# Patient Record
Sex: Male | Born: 1955 | Race: White | Hispanic: No | Marital: Single | Smoking: Never smoker
Health system: Southern US, Community
[De-identification: ages and names within clinical notes are randomized; demographics above are authoritative.]

## PROBLEM LIST (undated history)

## (undated) DIAGNOSIS — M1990 Unspecified osteoarthritis, unspecified site: Secondary | ICD-10-CM

## (undated) DIAGNOSIS — E039 Hypothyroidism, unspecified: Secondary | ICD-10-CM

## (undated) DIAGNOSIS — R296 Repeated falls: Secondary | ICD-10-CM

## (undated) DIAGNOSIS — I10 Essential (primary) hypertension: Secondary | ICD-10-CM

## (undated) DIAGNOSIS — M810 Age-related osteoporosis without current pathological fracture: Secondary | ICD-10-CM

## (undated) DIAGNOSIS — F79 Unspecified intellectual disabilities: Secondary | ICD-10-CM

## (undated) DIAGNOSIS — H52209 Unspecified astigmatism, unspecified eye: Secondary | ICD-10-CM

## (undated) DIAGNOSIS — H5210 Myopia, unspecified eye: Secondary | ICD-10-CM

## (undated) DIAGNOSIS — D649 Anemia, unspecified: Secondary | ICD-10-CM

## (undated) DIAGNOSIS — G40909 Epilepsy, unspecified, not intractable, without status epilepticus: Secondary | ICD-10-CM

## (undated) DIAGNOSIS — Q909 Down syndrome, unspecified: Secondary | ICD-10-CM

---

## 1997-10-08 ENCOUNTER — Other Ambulatory Visit: Admission: RE | Admit: 1997-10-08 | Discharge: 1997-10-08 | Payer: Self-pay | Admitting: Internal Medicine

## 2002-01-23 ENCOUNTER — Encounter: Payer: Self-pay | Admitting: Specialist

## 2002-01-23 ENCOUNTER — Ambulatory Visit (HOSPITAL_COMMUNITY): Admission: RE | Admit: 2002-01-23 | Discharge: 2002-01-23 | Payer: Self-pay | Admitting: Specialist

## 2002-01-24 ENCOUNTER — Encounter: Payer: Self-pay | Admitting: Specialist

## 2002-01-24 ENCOUNTER — Ambulatory Visit (HOSPITAL_COMMUNITY): Admission: RE | Admit: 2002-01-24 | Discharge: 2002-01-24 | Payer: Self-pay | Admitting: Specialist

## 2002-04-15 ENCOUNTER — Encounter: Payer: Self-pay | Admitting: Specialist

## 2002-04-15 ENCOUNTER — Ambulatory Visit (HOSPITAL_COMMUNITY): Admission: RE | Admit: 2002-04-15 | Discharge: 2002-04-15 | Payer: Self-pay | Admitting: Specialist

## 2002-04-16 ENCOUNTER — Emergency Department (HOSPITAL_COMMUNITY): Admission: EM | Admit: 2002-04-16 | Discharge: 2002-04-16 | Payer: Self-pay | Admitting: Emergency Medicine

## 2002-04-28 ENCOUNTER — Ambulatory Visit (HOSPITAL_COMMUNITY): Admission: RE | Admit: 2002-04-28 | Discharge: 2002-04-28 | Payer: Self-pay | Admitting: Specialist

## 2002-04-28 ENCOUNTER — Encounter: Payer: Self-pay | Admitting: Specialist

## 2002-07-14 ENCOUNTER — Ambulatory Visit (HOSPITAL_COMMUNITY): Admission: RE | Admit: 2002-07-14 | Discharge: 2002-07-14 | Payer: Self-pay | Admitting: Specialist

## 2002-07-14 ENCOUNTER — Encounter: Payer: Self-pay | Admitting: Specialist

## 2002-08-25 ENCOUNTER — Encounter: Payer: Self-pay | Admitting: Specialist

## 2002-08-25 ENCOUNTER — Ambulatory Visit (HOSPITAL_COMMUNITY): Admission: RE | Admit: 2002-08-25 | Discharge: 2002-08-25 | Payer: Self-pay | Admitting: Specialist

## 2002-10-02 ENCOUNTER — Ambulatory Visit (HOSPITAL_COMMUNITY): Admission: RE | Admit: 2002-10-02 | Discharge: 2002-10-02 | Payer: Self-pay | Admitting: Specialist

## 2002-10-02 ENCOUNTER — Encounter: Payer: Self-pay | Admitting: Specialist

## 2003-07-27 ENCOUNTER — Ambulatory Visit (HOSPITAL_COMMUNITY): Admission: RE | Admit: 2003-07-27 | Discharge: 2003-07-27 | Payer: Self-pay | Admitting: Specialist

## 2003-08-07 ENCOUNTER — Ambulatory Visit (HOSPITAL_COMMUNITY): Admission: RE | Admit: 2003-08-07 | Discharge: 2003-08-07 | Payer: Self-pay | Admitting: Specialist

## 2003-08-20 ENCOUNTER — Ambulatory Visit (HOSPITAL_COMMUNITY): Admission: RE | Admit: 2003-08-20 | Discharge: 2003-08-20 | Payer: Self-pay | Admitting: Specialist

## 2003-10-08 ENCOUNTER — Ambulatory Visit (HOSPITAL_COMMUNITY): Admission: RE | Admit: 2003-10-08 | Discharge: 2003-10-08 | Payer: Self-pay | Admitting: Specialist

## 2004-03-22 ENCOUNTER — Ambulatory Visit: Payer: Self-pay | Admitting: Internal Medicine

## 2004-03-23 ENCOUNTER — Ambulatory Visit (HOSPITAL_COMMUNITY): Admission: RE | Admit: 2004-03-23 | Discharge: 2004-03-23 | Payer: Self-pay | Admitting: Specialist

## 2004-06-23 ENCOUNTER — Emergency Department (HOSPITAL_COMMUNITY): Admission: EM | Admit: 2004-06-23 | Discharge: 2004-06-23 | Payer: Self-pay | Admitting: Emergency Medicine

## 2011-04-09 ENCOUNTER — Emergency Department (HOSPITAL_BASED_OUTPATIENT_CLINIC_OR_DEPARTMENT_OTHER)
Admission: EM | Admit: 2011-04-09 | Discharge: 2011-04-10 | Disposition: A | Discharge status: Home / Self Care | Payer: Medicare Other | Attending: Emergency Medicine | Admitting: Emergency Medicine

## 2011-04-09 ENCOUNTER — Emergency Department (INDEPENDENT_AMBULATORY_CARE_PROVIDER_SITE_OTHER): Payer: Medicare Other

## 2011-04-09 ENCOUNTER — Emergency Department (HOSPITAL_BASED_OUTPATIENT_CLINIC_OR_DEPARTMENT_OTHER): Payer: Medicare Other

## 2011-04-09 DIAGNOSIS — R05 Cough: Secondary | ICD-10-CM

## 2011-04-09 DIAGNOSIS — R509 Fever, unspecified: Secondary | ICD-10-CM | POA: Insufficient documentation

## 2011-04-09 DIAGNOSIS — R059 Cough, unspecified: Secondary | ICD-10-CM | POA: Insufficient documentation

## 2011-04-09 DIAGNOSIS — J189 Pneumonia, unspecified organism: Secondary | ICD-10-CM | POA: Insufficient documentation

## 2011-04-09 HISTORY — DX: Unspecified intellectual disabilities: F79

## 2011-04-09 LAB — CBC
MCH: 36.7 pg — ABNORMAL HIGH (ref 26.0–34.0)
MCHC: 33.1 g/dL (ref 30.0–36.0)
Platelets: 187 10*3/uL (ref 150–400)

## 2011-04-09 LAB — COMPREHENSIVE METABOLIC PANEL
ALT: 15 U/L (ref 0–53)
AST: 23 U/L (ref 0–37)
Calcium: 8.7 mg/dL (ref 8.4–10.5)
Creatinine, Ser: 0.9 mg/dL (ref 0.50–1.35)
GFR calc Af Amer: >90 mL/min (ref >90)
Glucose, Bld: 91 mg/dL (ref 70–99)
Sodium: 139 mEq/L (ref 135–145)
Total Protein: 8.2 g/dL (ref 6.0–8.3)

## 2011-04-09 LAB — DIFFERENTIAL
Eosinophils Absolute: 0 10*3/uL (ref 0.0–0.7)
Monocytes Absolute: 0.6 10*3/uL (ref 0.1–1.0)
Neutrophils Relative %: 84 % — ABNORMAL HIGH (ref 43–77)

## 2011-04-09 LAB — LACTIC ACID, PLASMA: Lactic Acid, Venous: 1.8 mmol/L (ref 0.5–2.2)

## 2011-04-09 LAB — AMMONIA: Ammonia: 15 umol/L (ref 11–60)

## 2011-04-09 MED ORDER — OSELTAMIVIR PHOSPHATE 75 MG PO CAPS
75.0000 mg | ORAL_CAPSULE | Freq: Once | ORAL | Status: AC
  Administered 2011-04-09: 75 mg via ORAL

## 2011-04-09 MED ORDER — OSELTAMIVIR PHOSPHATE 75 MG PO CAPS
ORAL_CAPSULE | ORAL | Status: AC
  Administered 2011-04-09: 75 mg via ORAL
  Filled 2011-04-09: qty 1

## 2011-04-09 MED ORDER — SODIUM CHLORIDE 0.9 % IV BOLUS (SEPSIS)
1000.0000 mL | Freq: Once | INTRAVENOUS | Status: AC
  Administered 2011-04-09: 1000 mL via INTRAVENOUS

## 2011-04-09 MED ORDER — OSELTAMIVIR PHOSPHATE 6 MG/ML PO SUSR
75.0000 mg | Freq: Once | ORAL | Status: DC
  Filled 2011-04-09: qty 12.5

## 2011-04-09 MED ORDER — MOXIFLOXACIN HCL IN NACL 400 MG/250ML IV SOLN
400.0000 mg | Freq: Once | INTRAVENOUS | Status: AC
  Administered 2011-04-09: 400 mg via INTRAVENOUS
  Filled 2011-04-09: qty 250

## 2011-04-09 NOTE — ED Notes (Signed)
Caregiver states that pt has been running a fever and has persistent cough, was given tylenol at group home

## 2011-04-09 NOTE — ED Provider Notes (Signed)
History  This chart was scribed for Cyndra Numbers, MD by Bennett Scrape. This patient was seen in room [facility]CT1/[facility]1 and the patient's care was started at 10:34PM.  CSN: [id number] Arrival date & time: 04/09/2011  9:05 PM   First MD Initiated Contact with Patient 04/09/11 2217      Chief Complaint  Patient presents with  . Cough  . Fever    102.5   Level 5 Caveat-Pt is non-verbal The history is provided by a caregiver. No language interpreter was used.    Travis Hanna is a 55 y.o. male brought in by caregiver from SNF to the Emergency Department complaining of fever to 102 and non-productive cough. Pt is mentally retarded and non-verbal.  Fever measured at 99.1 in the ED. Pt is from group home and is experiencing similar symptoms to other patients in the home. Caregiver states that he was given robitussin and tylenol for symptoms around 5:45PM with no improvement. Caregiver reports that he was diagnosed through a chest x-ray and treated for pneumonia 2 weeks ago with an unknown antibiotic. Caregiver states that pt is not at his normal baseline due to decreased appetite and increased tiredness. Caregiver confirms sick contacts at nursing home and reports two other caregivers being on tamiflu. Symptoms began in the past 48 hours.  Past Medical History  Diagnosis Date  . MR (mental retardation)     History reviewed. No pertinent past surgical history.  History reviewed. No pertinent family history.  History  Substance Use Topics  . Smoking status: Not on file  . Smokeless tobacco: Not on file  . Alcohol Use:       Review of Systems  Unable to perform ROS: Other  Constitutional: Positive for fever.  Respiratory: Positive for cough.   ROS not possible due to non-verbal patient.  Allergies  Carbapenems; Cephalosporins; and Penicillins  Home Medications   Current Outpatient Rx  Name Route Sig Dispense Refill  . ACETAMINOPHEN 325 MG PO TABS Oral Take 650 mg by mouth every  6 (six) hours as needed. For pain and fever     . CALCIUM CARBONATE-VITAMIN D 500-200 MG-UNIT PO TABS Oral Take 1 tablet by mouth daily.      Marland Kitchen CARBAMAZEPINE 100 MG/5ML PO SUSP Oral Take 200 mg by mouth 2 (two) times daily.      . ENALAPRIL MALEATE 5 MG PO TABS Oral Take 5 mg by mouth daily.      . GUAIFENESIN 100 MG/5ML PO LIQD Oral Take 300 mg by mouth 3 (three) times daily as needed. For cough      . LACTULOSE 10 GM/15ML PO SOLN Oral Take 10 mLs by mouth daily.      Marland Kitchen LEVOTHYROXINE SODIUM 50 MCG PO TABS Oral Take 50 mcg by mouth daily.        Triage Vitals: BP 149/72  Pulse 111  Temp(Src) 99.1 F (37.3 C) (Oral)  Resp 20  Wt 150 lb (68.04 kg)  SpO2 95%  Physical Exam  Nursing note and vitals reviewed. Constitutional: He appears well-developed and well-nourished.       Faces of down syndrome  HENT:  Head: Normocephalic and atraumatic.  Eyes: Conjunctivae and EOM are normal. Pupils are equal, round, and reactive to light.       Left eye esotropia  Neck: Neck supple. No tracheal deviation present.  Cardiovascular: Regular rhythm and normal heart sounds.  Tachycardia present.   Pulmonary/Chest: Effort normal and breath sounds normal. No respiratory distress. He has  no wheezes. He has no rales.  Abdominal: Soft. Bowel sounds are normal. He exhibits no distension. There is no tenderness. There is no rebound and no guarding.  Musculoskeletal: Normal range of motion. He exhibits no edema.  Neurological: He is alert.       Pt is non-verbal.  He is less interactive than usual but caregivers feel that he is essentially at baseline aside from being sick.  Skin: Skin is warm and dry. No rash noted.    ED Course  Procedures (including critical care time)  DIAGNOSTIC STUDIES: Oxygen Saturation is 95% on room air, adequate by my interpretation.    COORDINATION OF CARE: 10:38PM-Discussed IV, chest x-ray, influenza swab, full blood work-up and tamiflu treatment with caregiver at pt's  bedside and caregiver agreed. 11:29PM-Discussed pneumonia findings on chest x-ray with caregiver and caregiver acknowledged findings. Will be given IV fluids and moxifloxacin. Labs are still pending. 12:00Pm-Pt remains afebrile and is clinically improving.  Labs Reviewed  CBC - Abnormal; Notable for the following:    RBC 3.76 (*)    MCV 110.9 (*)    MCH 36.7 (*)    All other components within normal limits  DIFFERENTIAL - Abnormal; Notable for the following:    Neutrophils Relative 84 (*)    Lymphocytes Relative 7 (*)    Lymphs Abs 0.5 (*)    All other components within normal limits  COMPREHENSIVE METABOLIC PANEL - Abnormal; Notable for the following:    Albumin 3.4 (*)    All other components within normal limits  LIPASE, BLOOD  LACTIC ACID, PLASMA  AMMONIA  URINALYSIS, ROUTINE W REFLEX MICROSCOPIC  URINE CULTURE  PROCALCITONIN  INFLUENZA PANEL BY PCR   Dg Chest 2 View  04/09/2011  *RADIOLOGY REPORT*  Clinical Data: Cough and fever.  CHEST - 2 VIEW  Comparison: Chest radiograph performed 06/23/2004  Findings: The lungs are well-aerated.  Focal right middle lobe and left lingular airspace opacities noted, worse on the right, concerning for multifocal pneumonia.  There is no evidence of pleural effusion or pneumothorax.  The heart is normal in size; the mediastinal contour is within normal limits.  No acute osseous abnormalities are seen.  There is mild acute kyphosis at the thoracolumbar junction, raising suspicion for underlying compression deformity.  IMPRESSION: Focal bilateral airspace disease, concerning for multifocal pneumonia.  Original Report Authenticated By: Tonia Ghent, M.D.     1. Healthcare-associated pneumonia       MDM  Patient was evaluated. He was not at his baseline per her caregivers. Patient was unable to provide information given that he is normally nonverbal. He was tachycardic. The patient was not febrile here. He did have laboratory workup to evaluate  for possible sepsis given his recent fever in addition to his tachycardia and recent pneumonia as well as his possible influenza exposure. PCR for influenza was sent. Patient had Tamiflu ordered. This was in addition to chest x-ray and laboratory workup. Patient had no leukocytosis or elevation of his lactate. His renal function was within normal limits. Patient also had no abnormalities noted on liver function and lipase. An ammonia was sent given patient home medication of lactulose. This also was not elevated. Chest x-ray did show focal bilateral airspace disease. Patient has been treated for pneumonia in the past few weeks. The comparison chest radiograph available for our radiologist was from February 2006. It is possible that some component of this is residual from most recent pneumonia. Patient had improvement of his vital signs as  he received normal saline IV in addition to Avelox IV. Patient was placed on Avelox given recent treatment failure, staph being unable to tell me the antibiotic he was on, as well as the number of sick residents in the group home currently. Patient was here with 2 other residents one of which was febrile as well. Additionally 2 residents of the group home have been treated with Tamiflu already. I discussed the patient's case with the nurse on call. She also was unable to tell me the antibiotic he was on. We discussed the importance of the patient being monitored for fluid intake. Staff was comfortable with patient being discharged back to the facility. Patient continues to receive IV fluids at this time.  His BP is consistent with his normal values.  The patient also was given tylenol when his temp was 100.5 on my reassessment. He will be discharged with prescription for Avelox as well as Tamiflu. Testing was sent given patient placement in a group facility with numerous individuals sick. For public health reasons all residents were tested this evening.  On final reassessment  patient was visibly improved clinically.  He was less tachycardic and his fever had improved.  BP was at baseline.  Patient was discharged in good condition and staff were strongly encouraged to closely monitor the patient's fluid intake and to return him to a healthcare facility immediately if he developed clinical worsening despite antibiotics.    I personally performed the services described in this documentation, which was scribed in my presence. The recorded information has been reviewed and considered.       Cyndra Numbers, MD 04/10/11 [phone]

## 2011-04-10 LAB — INFLUENZA PANEL BY PCR (TYPE A & B): H1N1 flu by pcr: NOT DETECTED

## 2011-04-10 LAB — PROCALCITONIN: Procalcitonin: <0.1 ng/mL

## 2011-04-10 MED ORDER — MOXIFLOXACIN HCL 400 MG PO TABS: 400.0000 mg | ORAL_TABLET | Freq: Every day | ORAL | Status: AC

## 2011-04-10 MED ORDER — ACETAMINOPHEN 160 MG/5ML PO SOLN
650.0000 mg | Freq: Once | ORAL | Status: AC
  Administered 2011-04-10: 650 mg via ORAL
  Filled 2011-04-10: qty 20.3

## 2011-04-10 MED ORDER — OSELTAMIVIR PHOSPHATE 75 MG PO CAPS: 75.0000 mg | ORAL_CAPSULE | Freq: Two times a day (BID) | ORAL | Status: AC

## 2012-05-15 ENCOUNTER — Encounter (HOSPITAL_BASED_OUTPATIENT_CLINIC_OR_DEPARTMENT_OTHER): Payer: Self-pay | Admitting: Emergency Medicine

## 2012-05-15 ENCOUNTER — Emergency Department (HOSPITAL_BASED_OUTPATIENT_CLINIC_OR_DEPARTMENT_OTHER)
Admission: EM | Admit: 2012-05-15 | Discharge: 2012-05-15 | Disposition: A | Discharge status: Home / Self Care | Payer: MEDICARE | Attending: Emergency Medicine | Admitting: Emergency Medicine

## 2012-05-15 DIAGNOSIS — R197 Diarrhea, unspecified: Secondary | ICD-10-CM

## 2012-05-15 DIAGNOSIS — F79 Unspecified intellectual disabilities: Secondary | ICD-10-CM | POA: Insufficient documentation

## 2012-05-15 DIAGNOSIS — Z2089 Contact with and (suspected) exposure to other communicable diseases: Secondary | ICD-10-CM | POA: Insufficient documentation

## 2012-05-15 DIAGNOSIS — R509 Fever, unspecified: Secondary | ICD-10-CM | POA: Insufficient documentation

## 2012-05-15 DIAGNOSIS — Z79899 Other long term (current) drug therapy: Secondary | ICD-10-CM | POA: Insufficient documentation

## 2012-05-15 DIAGNOSIS — Q909 Down syndrome, unspecified: Secondary | ICD-10-CM | POA: Insufficient documentation

## 2012-05-15 HISTORY — DX: Down syndrome, unspecified: Q90.9

## 2012-05-15 LAB — CBC WITH DIFFERENTIAL/PLATELET
Basophils Absolute: 0 10*3/uL (ref 0.0–0.1)
Basophils Relative: 0 % (ref 0–1)
HCT: 39.8 % (ref 39.0–52.0)
Lymphocytes Relative: 11 % — ABNORMAL LOW (ref 12–46)
MCHC: 35.7 g/dL (ref 30.0–36.0)
Monocytes Absolute: 0.3 10*3/uL (ref 0.1–1.0)
Neutro Abs: 3.7 10*3/uL (ref 1.7–7.7)
Platelets: 151 10*3/uL (ref 150–400)
RDW: 12.6 % (ref 11.5–15.5)
WBC: 4.5 10*3/uL (ref 4.0–10.5)

## 2012-05-15 LAB — BASIC METABOLIC PANEL
CO2: 24 mEq/L (ref 19–32)
Calcium: 8.5 mg/dL (ref 8.4–10.5)
Chloride: 103 mEq/L (ref 96–112)
Creatinine, Ser: 1 mg/dL (ref 0.50–1.35)
GFR calc Af Amer: >90 mL/min (ref >90)
Sodium: 139 mEq/L (ref 135–145)

## 2012-05-15 MED ORDER — LOPERAMIDE HCL 2 MG PO CAPS
4.0000 mg | ORAL_CAPSULE | Freq: Once | ORAL | Status: AC
  Administered 2012-05-15: 4 mg via ORAL
  Filled 2012-05-15: qty 2

## 2012-05-15 MED ORDER — SODIUM CHLORIDE 0.9 % IV BOLUS (SEPSIS)
1000.0000 mL | Freq: Once | INTRAVENOUS | Status: AC
  Administered 2012-05-15: 1000 mL via INTRAVENOUS

## 2012-05-15 MED ORDER — SODIUM CHLORIDE 0.9 % IV SOLN
Freq: Once | INTRAVENOUS | Status: AC
  Administered 2012-05-15: 13:00:00 via INTRAVENOUS

## 2012-05-15 NOTE — ED Notes (Signed)
BIB group home staff member for fever and diarrhea since yesterday, no vomiting, no other reported complaints, NAD

## 2012-05-15 NOTE — ED Provider Notes (Signed)
History     CSN: [id number]  Arrival date & time 05/15/12  1238   First MD Initiated Contact with Patient 05/15/12 1250      Chief Complaint  Patient presents with  . Diarrhea    (Consider location/radiation/quality/duration/timing/severity/associated sxs/prior treatment) Patient is a 57 y.o. male presenting with diarrhea. The history is provided by a caregiver. The history is limited by a developmental delay.  Diarrhea The primary symptoms include diarrhea.  He is nonverbal secondary to mental retardation. He is reported to have had diarrhea onset yesterday without nausea vomiting. He is not eating or drinking. He has had fevers up to 101. There have been sick contacts.  Past Medical History  Diagnosis Date  . MR (mental retardation)   . Down syndrome     History reviewed. No pertinent past surgical history.  No family history on file.  History  Substance Use Topics  . Smoking status: Not on file  . Smokeless tobacco: Not on file  . Alcohol Use:       Review of Systems  Unable to perform ROS: Other  Gastrointestinal: Positive for diarrhea.    Allergies  Carbapenems; Cephalosporins; and Penicillins  Home Medications   Current Outpatient Rx  Name  Route  Sig  Dispense  Refill  . ACETAMINOPHEN 325 MG PO TABS   Oral   Take 650 mg by mouth every 6 (six) hours as needed. For pain and fever          . CALCIUM CARBONATE-VITAMIN D 500-200 MG-UNIT PO TABS   Oral   Take 1 tablet by mouth daily.           Marland Kitchen CARBAMAZEPINE 100 MG/5ML PO SUSP   Oral   Take 200 mg by mouth 2 (two) times daily.           . ENALAPRIL MALEATE 5 MG PO TABS   Oral   Take 5 mg by mouth daily.           . GUAIFENESIN 100 MG/5ML PO LIQD   Oral   Take 300 mg by mouth 3 (three) times daily as needed. For cough           . LACTULOSE 10 GM/15ML PO SOLN   Oral   Take 10 mLs by mouth daily.           Marland Kitchen LEVOTHYROXINE SODIUM 50 MCG PO TABS   Oral   Take 50 mcg by mouth daily.              BP 108/72  Pulse 73  Resp 20  SpO2 99%  Physical Exam  Nursing note and vitals reviewed. 57 year old male, resting comfortably and in no acute distress. Vital signs are normal. Oxygen saturation is 99%, which is normal. Head is normocephalic and atraumatic. PERRLA, both eyes deviated medially. Oropharynx is clear. Mucous membranes are moist. Neck is nontender and supple without adenopathy or JVD. Back is nontender and there is no CVA tenderness. Lungs are clear without rales, wheezes, or rhonchi. Chest is nontender. Heart has regular rate and rhythm without murmur. Abdomen is soft, flat, nontender without masses or hepatosplenomegaly and peristalsis is hypoactive. Extremities have no cyanosis or edema, full range of motion is present. Skin is warm and dry without rash. Neurologic: He is awake and alert but completely nonverbal. He does follow some commands.Cranial nerves are intact except for eye movements as noted above, there are no gross motor or sensory deficits.   ED Course  Procedures (including critical care time)  Results for orders placed during the hospital encounter of 05/15/12  CBC WITH DIFFERENTIAL      Component Value Range   WBC 4.5  4.0 - 10.5 K/uL   RBC 3.71 (*) 4.22 - 5.81 MIL/uL   Hemoglobin 14.2  13.0 - 17.0 g/dL   HCT 47.8  29.5 - 62.1 %   MCV 107.3 (*) 78.0 - 100.0 fL   MCH 38.3 (*) 26.0 - 34.0 pg   MCHC 35.7  30.0 - 36.0 g/dL   RDW 30.8  65.7 - 84.6 %   Platelets 151  150 - 400 K/uL   Neutrophils Relative 82 (*) 43 - 77 %   Neutro Abs 3.7  1.7 - 7.7 K/uL   Lymphocytes Relative 11 (*) 12 - 46 %   Lymphs Abs 0.5 (*) 0.7 - 4.0 K/uL   Monocytes Relative 7  3 - 12 %   Monocytes Absolute 0.3  0.1 - 1.0 K/uL   Eosinophils Relative 0  0 - 5 %   Eosinophils Absolute 0.0  0.0 - 0.7 K/uL   Basophils Relative 0  0 - 1 %   Basophils Absolute 0.0  0.0 - 0.1 K/uL  BASIC METABOLIC PANEL      Component Value Range   Sodium 139  135 - 145 mEq/L    Potassium 4.0  3.5 - 5.1 mEq/L   Chloride 103  96 - 112 mEq/L   CO2 24  19 - 32 mEq/L   Glucose, Bld 103 (*) 70 - 99 mg/dL   BUN 19  6 - 23 mg/dL   Creatinine, Ser 9.62  0.50 - 1.35 mg/dL   Calcium 8.5  8.4 - 95.2 mg/dL   GFR calc non Af Amer 83 (*) >90 mL/min   GFR calc Af Amer >90  >90 mL/min    1. Diarrhea       MDM  Diarrhea which is probably secondary to a viral enteritis. He will be given IV hydration and oral loperamide.        Dione Booze, MD 05/15/12 [phone]

## 2013-06-25 ENCOUNTER — Inpatient Hospital Stay (HOSPITAL_BASED_OUTPATIENT_CLINIC_OR_DEPARTMENT_OTHER)
Admission: EM | Admit: 2013-06-25 | Discharge: 2013-06-27 | DRG: 390 | Disposition: A | Discharge status: Home / Self Care | Payer: MEDICARE | Attending: Internal Medicine | Admitting: Internal Medicine

## 2013-06-25 ENCOUNTER — Emergency Department (HOSPITAL_BASED_OUTPATIENT_CLINIC_OR_DEPARTMENT_OTHER): Payer: MEDICARE

## 2013-06-25 ENCOUNTER — Encounter (HOSPITAL_BASED_OUTPATIENT_CLINIC_OR_DEPARTMENT_OTHER): Payer: Self-pay | Admitting: Emergency Medicine

## 2013-06-25 DIAGNOSIS — Q909 Down syndrome, unspecified: Secondary | ICD-10-CM

## 2013-06-25 DIAGNOSIS — K567 Ileus, unspecified: Secondary | ICD-10-CM | POA: Diagnosis present

## 2013-06-25 DIAGNOSIS — I10 Essential (primary) hypertension: Secondary | ICD-10-CM | POA: Diagnosis present

## 2013-06-25 DIAGNOSIS — R109 Unspecified abdominal pain: Secondary | ICD-10-CM | POA: Diagnosis present

## 2013-06-25 DIAGNOSIS — F79 Unspecified intellectual disabilities: Secondary | ICD-10-CM | POA: Diagnosis present

## 2013-06-25 DIAGNOSIS — K5900 Constipation, unspecified: Secondary | ICD-10-CM

## 2013-06-25 DIAGNOSIS — E039 Hypothyroidism, unspecified: Secondary | ICD-10-CM | POA: Diagnosis present

## 2013-06-25 DIAGNOSIS — K560 Paralytic ileus: Principal | ICD-10-CM | POA: Diagnosis present

## 2013-06-25 DIAGNOSIS — Z79899 Other long term (current) drug therapy: Secondary | ICD-10-CM

## 2013-06-25 LAB — CBC WITH DIFFERENTIAL/PLATELET
BASOS PCT: 0 % (ref 0–1)
Basophils Absolute: 0 10*3/uL (ref 0.0–0.1)
EOS ABS: 0 10*3/uL (ref 0.0–0.7)
EOS PCT: 0 % (ref 0–5)
HEMATOCRIT: 40.1 % (ref 39.0–52.0)
HEMOGLOBIN: 13.1 g/dL (ref 13.0–17.0)
Lymphocytes Relative: 10 % — ABNORMAL LOW (ref 12–46)
Lymphs Abs: 0.6 10*3/uL — ABNORMAL LOW (ref 0.7–4.0)
MCH: 37.1 pg — AB (ref 26.0–34.0)
MCHC: 32.7 g/dL (ref 30.0–36.0)
MCV: 113.6 fL — AB (ref 78.0–100.0)
MONO ABS: 0.6 10*3/uL (ref 0.1–1.0)
MONOS PCT: 10 % (ref 3–12)
NEUTROS PCT: 80 % — AB (ref 43–77)
Neutro Abs: 5.1 10*3/uL (ref 1.7–7.7)
Platelets: 192 10*3/uL (ref 150–400)
RBC: 3.53 MIL/uL — ABNORMAL LOW (ref 4.22–5.81)
RDW: 12.2 % (ref 11.5–15.5)
WBC: 6.4 10*3/uL (ref 4.0–10.5)

## 2013-06-25 LAB — BASIC METABOLIC PANEL
BUN: 12 mg/dL (ref 6–23)
CALCIUM: 9 mg/dL (ref 8.4–10.5)
CO2: 27 mEq/L (ref 19–32)
CREATININE: 0.8 mg/dL (ref 0.50–1.35)
Chloride: 100 mEq/L (ref 96–112)
GFR calc Af Amer: >90 mL/min (ref >90)
GLUCOSE: 99 mg/dL (ref 70–99)
Potassium: 4.6 mEq/L (ref 3.7–5.3)
Sodium: 141 mEq/L (ref 137–147)

## 2013-06-25 LAB — URINALYSIS, ROUTINE W REFLEX MICROSCOPIC
Bilirubin Urine: NEGATIVE
GLUCOSE, UA: NEGATIVE mg/dL
HGB URINE DIPSTICK: NEGATIVE
Leukocytes, UA: NEGATIVE
Nitrite: NEGATIVE
PH: 6 (ref 5.0–8.0)
PROTEIN: NEGATIVE mg/dL
Specific Gravity, Urine: >1.046 — ABNORMAL HIGH (ref 1.005–1.030)
Urobilinogen, UA: 0.2 mg/dL (ref 0.0–1.0)

## 2013-06-25 LAB — MAGNESIUM: Magnesium: 2.2 mg/dL (ref 1.5–2.5)

## 2013-06-25 LAB — CG4 I-STAT (LACTIC ACID): Lactic Acid, Venous: 1.64 mmol/L (ref 0.5–2.2)

## 2013-06-25 MED ORDER — LACTULOSE 10 GM/15ML PO SOLN
6.6667 g | Freq: Every day | ORAL | Status: DC
  Administered 2013-06-26: 6.6667 g via ORAL
  Filled 2013-06-25 (×2): qty 15

## 2013-06-25 MED ORDER — CARBAMAZEPINE 100 MG/5ML PO SUSP
200.0000 mg | Freq: Two times a day (BID) | ORAL | Status: DC
  Administered 2013-06-26 – 2013-06-27 (×3): 200 mg via ORAL
  Filled 2013-06-25 (×6): qty 10

## 2013-06-25 MED ORDER — LEVOTHYROXINE SODIUM 50 MCG PO TABS
50.0000 ug | ORAL_TABLET | Freq: Every day | ORAL | Status: DC
  Administered 2013-06-26 – 2013-06-27 (×2): 50 ug via ORAL
  Filled 2013-06-25 (×4): qty 1

## 2013-06-25 MED ORDER — ONDANSETRON HCL 4 MG/2ML IJ SOLN: 4.0000 mg | Freq: Four times a day (QID) | INTRAMUSCULAR | Status: DC | PRN

## 2013-06-25 MED ORDER — ONDANSETRON HCL 4 MG PO TABS: 4.0000 mg | ORAL_TABLET | Freq: Four times a day (QID) | ORAL | Status: DC | PRN

## 2013-06-25 MED ORDER — CALCIUM CARBONATE-VITAMIN D 500-200 MG-UNIT PO TABS
1.0000 | ORAL_TABLET | Freq: Every day | ORAL | Status: DC
  Administered 2013-06-26 – 2013-06-27 (×2): 1 via ORAL
  Filled 2013-06-25 (×2): qty 1

## 2013-06-25 MED ORDER — ENALAPRIL MALEATE 5 MG PO TABS
5.0000 mg | ORAL_TABLET | Freq: Every day | ORAL | Status: DC
  Administered 2013-06-27: 5 mg via ORAL
  Filled 2013-06-25 (×2): qty 1

## 2013-06-25 MED ORDER — HYDROCODONE-ACETAMINOPHEN 5-325 MG PO TABS
1.0000 | ORAL_TABLET | ORAL | Status: DC | PRN
  Administered 2013-06-26 (×2): 1 via ORAL
  Filled 2013-06-25 (×3): qty 1

## 2013-06-25 MED ORDER — IOHEXOL 300 MG/ML  SOLN
50.0000 mL | Freq: Once | INTRAMUSCULAR | Status: AC | PRN
  Administered 2013-06-25: 50 mL via ORAL

## 2013-06-25 MED ORDER — BISACODYL 10 MG RE SUPP
10.0000 mg | Freq: Every day | RECTAL | Status: DC | PRN
Start: 2013-06-25 — End: 2013-06-27

## 2013-06-25 MED ORDER — SODIUM CHLORIDE 0.9 % IV SOLN
INTRAVENOUS | Status: AC
Start: 2013-06-25 — End: 2013-06-26

## 2013-06-25 MED ORDER — ALUM & MAG HYDROXIDE-SIMETH 200-200-20 MG/5ML PO SUSP: 30.0000 mL | Freq: Four times a day (QID) | ORAL | Status: DC | PRN

## 2013-06-25 MED ORDER — SODIUM CHLORIDE 0.9 % IV BOLUS (SEPSIS)
1000.0000 mL | Freq: Once | INTRAVENOUS | Status: AC
  Administered 2013-06-25: 1000 mL via INTRAVENOUS

## 2013-06-25 MED ORDER — IOHEXOL 300 MG/ML  SOLN
100.0000 mL | Freq: Once | INTRAMUSCULAR | Status: AC | PRN
  Administered 2013-06-25: 100 mL via INTRAVENOUS

## 2013-06-25 NOTE — ED Provider Notes (Signed)
CSN: [id number]     Arrival date & time 06/25/13  1521 History   First MD Initiated Contact with Patient 06/25/13 1640     Chief Complaint  Patient presents with  . Constipation     (Consider location/radiation/quality/duration/timing/severity/associated sxs/prior Treatment) HPI  This is a 58 year old male with a history of MR and Down syndrome who lives in a group home who presents with constipation. Patient's caregivers are at the bedside and provide history. The patient is nonverbal. Per the caregivers, he has not had a bowel movement since Friday. He has a history of "holding in bowel movements." He has had 2 fleets enemas and 1 milk of magnesia without results. They state that over the last 24 hours, the patient has been holding his abdomen they feel that he is in pain. No recent medication changes. He has no history of abdominal surgeries.  Level V caveat for mental status  Past Medical History  Diagnosis Date  . MR (mental retardation)   . Down syndrome    History reviewed. No pertinent past surgical history. History reviewed. No pertinent family history. History  Substance Use Topics  . Smoking status: Not on file  . Smokeless tobacco: Not on file  . Alcohol Use:     Review of Systems  Unable to perform ROS: Patient nonverbal      Allergies  Carbapenems; Cephalosporins; and Penicillins  Home Medications   No current outpatient prescriptions on file. BP 147/73  Pulse 85  Temp(Src) 96.4 F (35.8 C) (Oral)  Resp 18  Wt 145 lb 8.1 oz (66 kg)  SpO2 100% Physical Exam  Nursing note and vitals reviewed. Constitutional: No distress.  HENT:  Head: Normocephalic and atraumatic.  Eyes: Pupils are equal, round, and reactive to light.  Neck: Neck supple.  Cardiovascular: Normal rate, regular rhythm and normal heart sounds.   No murmur heard. Pulmonary/Chest: Effort normal and breath sounds normal. No respiratory distress. He has no wheezes.  Abdominal: Soft.  Bowel sounds are normal. He exhibits no distension. There is tenderness. There is no rebound.  Tenderness to palpation with voluntary guarding over the epigastrium  Genitourinary: Rectum normal.  No stools noted, no blood  Musculoskeletal: He exhibits no edema.  Lymphadenopathy:    He has no cervical adenopathy.  Neurological: He is alert.  Skin: Skin is warm and dry.    ED Course  Procedures (including critical care time) Labs Review Labs Reviewed  CBC WITH DIFFERENTIAL - Abnormal; Notable for the following:    RBC 3.53 (*)    MCV 113.6 (*)    MCH 37.1 (*)    Neutrophils Relative % 80 (*)    Lymphocytes Relative 10 (*)    Lymphs Abs 0.6 (*)    All other components within normal limits  URINALYSIS, ROUTINE W REFLEX MICROSCOPIC - Abnormal; Notable for the following:    Specific Gravity, Urine >1.046 (*)    Ketones, ur >80 (*)    All other components within normal limits  BASIC METABOLIC PANEL  MAGNESIUM  TSH  BASIC METABOLIC PANEL  CBC  CG4 I-STAT (LACTIC ACID)   Imaging Review Dg Abd 1 View  06/25/2013   CLINICAL DATA:  No bowel movement.  Pain.  EXAM: ABDOMEN - 1 VIEW  COMPARISON:  Chest x-ray 06/08/2011.  FINDINGS: Distended loops of small and large bowel are noted, most consistent with adynamic ileus. Bowel is projected over the lower chest. This may be secondary to a protuberant abdomen. To further evaluate CT of  the abdomen and pelvis can be obtained as needed. No free air noted. Severe degenerative changes and scoliosis lumbar spine. Lung bases clear.  IMPRESSION: Distended small and large bowel loops suggesting adynamic ileus. Bowel is noted projected over the lower chest, this is most likely secondary to protuberant abdomen. CT of the abdomen pelvis can be obtained for further evaluation. No free air.   Electronically Signed   By: Maisie Fushomas  Register   On: 06/25/2013 16:05   Ct Abdomen Pelvis W Contrast  06/25/2013   CLINICAL DATA:  Constipation and bowel distension.   EXAM: CT ABDOMEN AND PELVIS WITH CONTRAST  TECHNIQUE: Multidetector CT imaging of the abdomen and pelvis was performed using the standard protocol following bolus administration of intravenous contrast.  CONTRAST:  50mL OMNIPAQUE IOHEXOL 300 MG/ML SOLN, 100mL OMNIPAQUE IOHEXOL 300 MG/ML SOLN  COMPARISON:  DG ABDOMEN 1V dated 06/25/2013  FINDINGS: Mild gaseous distention primarily of the colon likely corresponds to ileus. No significant fecal material is identified in the colon. The small bowel is of normal caliber.  There is a moderate-sized hiatal hernia. No free fluid, free air or abscess is identified. No masses, hernias or enlarged lymph nodes are seen.  The liver, gallbladder, pancreas, spleen, adrenal glands and kidneys are unremarkable. The abdominal aorta is of normal caliber. The bladder shows circumferential wall thickening, likely reflecting chronic outlet obstruction. The spine shows severe degenerative changes and a leftward convex scoliosis.  IMPRESSION: Mild colonic ileus pattern without significant retained fecal material.  Moderate-sized hiatal hernia present.  Bladder wall thickening likely reflects chronic outlet obstruction.   Electronically Signed   By: Irish LackGlenn  Yamagata M.D.   On: 06/25/2013 18:59      MDM   Final diagnoses:  Ileus  Abdominal pain, unspecified site  Constipation  Down syndrome    Patient presents with abdominal pain. Tender on exam. No stool in the vault. KUB suggestive of ileus and CT scan confirms. Patient dehydrated with greater than 80 ketones in the urine. He was given a normal saline bolus. He will be admitted for management of his ileus and IV fluids.    Shon Batonourtney F Zorina Mallin, MD 06/25/13 [phone]

## 2013-06-25 NOTE — H&P (Signed)
Chief Complaint:  abd pain  HPI: 58 yo male h/o downs syndrome, nonverbal at baseline lives in a group home sent in for evaluation of abdominal pain.  Reported that pt had not had a bowel movement in 4 days.  No report of n/v/fevers.  Pt seems to have some understanding of verbal command.  Has h/o significant mental retardation.  He is sitting in bed, and appears comfortable.  When i asked him if he was in any pain he grimaced and sounded like he said the word pain.    Review of Systems:  Unobtainable from patient.  All history from epic chart and physician at [facility].  Past Medical History: Past Medical History  Diagnosis Date  . MR (mental retardation)   . Down syndrome    History reviewed. No pertinent past surgical history.  Medications: Prior to Admission medications   Medication Sig Start Date End Date Taking? Authorizing Provider  alendronate (FOSAMAX) 70 MG tablet Take 70 mg by mouth once a week. Take with a full glass of water on an empty stomach.   Yes Historical Provider, MD  acetaminophen (TYLENOL) 325 MG tablet Take 650 mg by mouth every 6 (six) hours as needed. For pain and fever     Historical Provider, MD  calcium-vitamin D (OSCAL WITH D) 500-200 MG-UNIT per tablet Take 1 tablet by mouth daily.      Historical Provider, MD  carBAMazepine (TEGRETOL) 100 MG/5ML suspension Take 200 mg by mouth 2 (two) times daily.      Historical Provider, MD  enalapril (VASOTEC) 5 MG tablet Take 5 mg by mouth daily.      Historical Provider, MD  guaiFENesin (ROBITUSSIN) 100 MG/5ML liquid Take 300 mg by mouth 3 (three) times daily as needed. For cough      Historical Provider, MD  lactulose (CHRONULAC) 10 GM/15ML solution Take 10 mLs by mouth daily.      Historical Provider, MD  levothyroxine (SYNTHROID) 50 MCG tablet Take 50 mcg by mouth daily.      Historical Provider, MD    Allergies:   Allergies  Allergen Reactions  . Carbapenems   . Cephalosporins   .  Penicillins     Social History: Lives in a group home  Family History: History reviewed. No pertinent family history.  Physical Exam: Filed Vitals:   06/25/13 1529 06/25/13 1533 06/25/13 2147  BP: 140/90  116/71  Pulse: 76  82  Temp: 98.6 F (37 C)  98.3 F (36.8 C)  TempSrc:   Oral  Resp: 16  16  Weight:  58.06 kg (128 lb)   SpO2: 100%  97%   General appearance: alert, cooperative and no distress Head: Normocephalic, without obvious abnormality, atraumatic Eyes: negative  Inward deviation of left eye Nose: Nares normal. Septum midline. Mucosa normal. No drainage or sinus tenderness. Neck: no JVD and supple, symmetrical, trachea midline Lungs: clear to auscultation bilaterally Heart: regular rate and rhythm, S1, S2 normal, no murmur, click, rub or gallop Abdomen: soft, non-tender; bowel sounds normal; no masses,  no organomegaly Extremities: extremities normal, atraumatic, no cyanosis or edema Pulses: 2+ and symmetric Skin: Skin color, texture, turgor normal. No rashes or lesions Neurologic: Grossly normal  nonverbal    Labs on Admission:   Recent Labs  06/25/13 1215 06/25/13 1715  NA 141  --   K 4.6  --   CL 100  --   CO2 27  --   GLUCOSE 99  --  BUN 12  --   CREATININE 0.80  --   CALCIUM 9.0  --   MG  --  2.2    Recent Labs  06/25/13 1215  WBC 6.4  NEUTROABS 5.1  HGB 13.1  HCT 40.1  MCV 113.6*  PLT 192    Radiological Exams on Admission: Dg Abd 1 View  06/25/2013   CLINICAL DATA:  No bowel movement.  Pain.  EXAM: ABDOMEN - 1 VIEW  COMPARISON:  Chest x-ray 06/08/2011.  FINDINGS: Distended loops of small and large bowel are noted, most consistent with adynamic ileus. Bowel is projected over the lower chest. This may be secondary to a protuberant abdomen. To further evaluate CT of the abdomen and pelvis can be obtained as needed. No free air noted. Severe degenerative changes and scoliosis lumbar spine. Lung bases clear.  IMPRESSION: Distended small  and large bowel loops suggesting adynamic ileus. Bowel is noted projected over the lower chest, this is most likely secondary to protuberant abdomen. CT of the abdomen pelvis can be obtained for further evaluation. No free air.   Electronically Signed   By: Maisie Fus  Register   On: 06/25/2013 16:05   Ct Abdomen Pelvis W Contrast  06/25/2013   CLINICAL DATA:  Constipation and bowel distension.  EXAM: CT ABDOMEN AND PELVIS WITH CONTRAST  TECHNIQUE: Multidetector CT imaging of the abdomen and pelvis was performed using the standard protocol following bolus administration of intravenous contrast.  CONTRAST:  50mL OMNIPAQUE IOHEXOL 300 MG/ML SOLN, OMNIPAQUE IOHEXOL 300 MG/ML SOLN  COMPARISON:  DG ABDOMEN 1V dated 06/25/2013  FINDINGS: Mild gaseous distention primarily of the colon likely corresponds to ileus. No significant fecal material is identified in the colon. The small bowel is of normal caliber.  There is a moderate-sized hiatal hernia. No free fluid, free air or abscess is identified. No masses, hernias or enlarged lymph nodes are seen.  The liver, gallbladder, pancreas, spleen, adrenal glands and kidneys are unremarkable. The abdominal aorta is of normal caliber. The bladder shows circumferential wall thickening, likely reflecting chronic outlet obstruction. The spine shows severe degenerative changes and a leftward convex scoliosis.  IMPRESSION: Mild colonic ileus pattern without significant retained fecal material.  Moderate-sized hiatal hernia present.  Bladder wall thickening likely reflects chronic outlet obstruction.   Electronically Signed   By: Irish Lack M.D.   On: 06/25/2013 18:59    Assessment/Plan  58 yo male with downs syndrome/significant mental retardation comes in with abdominal pain/constipation and mild ileus  Principal Problem:   Ileus  abd exam pretty benign, could not illicit any grimacing during palpation.  ua is noninfected.  Lytes are normal.  Will also check tsh, i  see he is on synthroid, will ck to see if that is in good range.  Keep on liq diet for now and advance as tolerates.  Provide a suppository for constipation, but no significant seen on xray.  Gentle ivf overnight.  Will provide some po pain med prn and zofran prn.    Active Problems:   Abdominal pain, unspecified site   Down syndrome   Constipation  Presumptive full code.    Gauge Winski A 06/25/2013, 11:11 PM

## 2013-06-25 NOTE — ED Notes (Signed)
Pt is from group home and with caregiver reports constipation with hx of same fleets x 2 , MOM x 1  W/o results

## 2013-06-26 DIAGNOSIS — E039 Hypothyroidism, unspecified: Secondary | ICD-10-CM | POA: Diagnosis present

## 2013-06-26 LAB — CBC
HEMATOCRIT: 37.2 % — AB (ref 39.0–52.0)
HEMOGLOBIN: 12.2 g/dL — AB (ref 13.0–17.0)
MCH: 36.6 pg — AB (ref 26.0–34.0)
MCHC: 32.8 g/dL (ref 30.0–36.0)
MCV: 111.7 fL — AB (ref 78.0–100.0)
Platelets: ADEQUATE 10*3/uL (ref 150–400)
RBC: 3.33 MIL/uL — AB (ref 4.22–5.81)
RDW: 12.9 % (ref 11.5–15.5)
WBC: 5.3 10*3/uL (ref 4.0–10.5)

## 2013-06-26 LAB — BASIC METABOLIC PANEL
BUN: 10 mg/dL (ref 6–23)
CHLORIDE: 100 meq/L (ref 96–112)
CO2: 22 mEq/L (ref 19–32)
Calcium: 7.9 mg/dL — ABNORMAL LOW (ref 8.4–10.5)
Creatinine, Ser: 0.71 mg/dL (ref 0.50–1.35)
GFR calc Af Amer: >90 mL/min (ref >90)
GFR calc non Af Amer: >90 mL/min (ref >90)
GLUCOSE: 79 mg/dL (ref 70–99)
POTASSIUM: 4.1 meq/L (ref 3.7–5.3)
SODIUM: 138 meq/L (ref 137–147)

## 2013-06-26 LAB — TSH: TSH: 1.481 u[IU]/mL (ref 0.350–4.500)

## 2013-06-26 MED ORDER — BIOTENE DRY MOUTH MT LIQD
15.0000 mL | Freq: Two times a day (BID) | OROMUCOSAL | Status: DC
  Administered 2013-06-26 (×2): 15 mL via OROMUCOSAL

## 2013-06-26 MED ORDER — PANTOPRAZOLE SODIUM 40 MG PO TBEC
40.0000 mg | DELAYED_RELEASE_TABLET | Freq: Every day | ORAL | Status: DC
  Administered 2013-06-26 – 2013-06-27 (×2): 40 mg via ORAL
  Filled 2013-06-26 (×2): qty 1

## 2013-06-26 MED ORDER — ENOXAPARIN SODIUM 40 MG/0.4ML ~~LOC~~ SOLN
40.0000 mg | SUBCUTANEOUS | Status: DC
  Filled 2013-06-26 (×2): qty 0.4

## 2013-06-26 MED ORDER — ACETAMINOPHEN 325 MG PO TABS: 650.0000 mg | ORAL_TABLET | Freq: Four times a day (QID) | ORAL | Status: DC | PRN

## 2013-06-26 MED ORDER — HYDROCODONE-ACETAMINOPHEN 5-325 MG PO TABS
1.0000 | ORAL_TABLET | Freq: Three times a day (TID) | ORAL | Status: DC | PRN
  Administered 2013-06-27: 1 via ORAL
  Filled 2013-06-26: qty 1

## 2013-06-26 NOTE — Progress Notes (Signed)
[facility] PROGRESS NOTE  Travis Hanna [id number] DOB: 1955-12-19 DOA: 06/25/2013 PCP: [name]  Assessment/Plan:  Principal Problem:  colonic Ileus: has had several bowel movements. Advance diet. Add protonix. Change to inpatient. Active Problems:   Abdominal pain, unspecified site   Down syndrome   Unspecified hypothyroidism: TSH ok  Code Status:  full Family Communication:  None available Disposition Plan:  Back to group home  Consultants:    Procedures:     Antibiotics:    HPI/Subjective: Unable. Per tech, tolerating clears, several bowel movements  Objective: Filed Vitals:   06/26/13 1337  BP: 102/63  Pulse: 72  Temp: 97.7 F (36.5 C)  Resp: 16    Intake/Output Summary (Last 24 hours) at 06/26/13 1655 Last data filed at 06/26/13 1400  Gross per 24 hour  Intake   1280 ml  Output      0 ml  Net   1280 ml   Filed Weights   06/25/13 1533 06/25/13 2250  Weight: 58.06 kg (128 lb) 66 kg (145 lb 8.1 oz)    Exam:   General:  Appears comfortable. nonverbal  Cardiovascular: RRR without MGR  Respiratory: CTA without WRR  Abdomen: S, NT ND, normal bowel sounds  Ext: no CCE  Basic Metabolic Panel:  Recent Labs Lab 06/25/13 1215 06/25/13 1715 06/26/13 0016  NA 141  --  138  K 4.6  --  4.1  CL 100  --  100  CO2 27  --  22  GLUCOSE 99  --  79  BUN 12  --  10  CREATININE 0.80  --  0.71  CALCIUM 9.0  --  7.9*  MG  --  2.2  --    Liver Function Tests: No results found for this basename: AST, ALT, ALKPHOS, BILITOT, PROT, ALBUMIN,  in the last 168 hours No results found for this basename: LIPASE, AMYLASE,  in the last 168 hours No results found for this basename: AMMONIA,  in the last 168 hours CBC:  Recent Labs Lab 06/25/13 1215 06/26/13 0016  WBC 6.4 5.3  NEUTROABS 5.1  --   HGB 13.1 12.2*  HCT 40.1 37.2*  MCV 113.6* 111.7*  PLT 192 PLATELET CLUMPS NOTED ON SMEAR, COUNT APPEARS ADEQUATE   Cardiac  Enzymes: No results found for this basename: CKTOTAL, CKMB, CKMBINDEX, TROPONINI,  in the last 168 hours BNP (last 3 results) No results found for this basename: PROBNP,  in the last 8760 hours CBG: No results found for this basename: GLUCAP,  in the last 168 hours  No results found for this or any previous visit (from the past 240 hour(s)).   Studies: Dg Abd 1 View  06/25/2013   CLINICAL DATA:  No bowel movement.  Pain.  EXAM: ABDOMEN - 1 VIEW  COMPARISON:  Chest x-ray 06/08/2011.  FINDINGS: Distended loops of small and large bowel are noted, most consistent with adynamic ileus. Bowel is projected over the lower chest. This may be secondary to a protuberant abdomen. To further evaluate CT of the abdomen and pelvis can be obtained as needed. No free air noted. Severe degenerative changes and scoliosis lumbar spine. Lung bases clear.  IMPRESSION: Distended small and large bowel loops suggesting adynamic ileus. Bowel is noted projected over the lower chest, this is most likely secondary to protuberant abdomen. CT of the abdomen pelvis can be obtained for further evaluation. No free air.   Electronically Signed   By: Maisie Fushomas  Register   On: 06/25/2013  16:05   Ct Abdomen Pelvis W Contrast  06/25/2013   CLINICAL DATA:  Constipation and bowel distension.  EXAM: CT ABDOMEN AND PELVIS WITH CONTRAST  TECHNIQUE: Multidetector CT imaging of the abdomen and pelvis was performed using the standard protocol following bolus administration of intravenous contrast.  CONTRAST:  50mL OMNIPAQUE IOHEXOL 300 MG/ML SOLN, OMNIPAQUE IOHEXOL 300 MG/ML SOLN  COMPARISON:  DG ABDOMEN 1V dated 06/25/2013  FINDINGS: Mild gaseous distention primarily of the colon likely corresponds to ileus. No significant fecal material is identified in the colon. The small bowel is of normal caliber.  There is a moderate-sized hiatal hernia. No free fluid, free air or abscess is identified. No masses, hernias or enlarged lymph nodes are seen.   The liver, gallbladder, pancreas, spleen, adrenal glands and kidneys are unremarkable. The abdominal aorta is of normal caliber. The bladder shows circumferential wall thickening, likely reflecting chronic outlet obstruction. The spine shows severe degenerative changes and a leftward convex scoliosis.  IMPRESSION: Mild colonic ileus pattern without significant retained fecal material.  Moderate-sized hiatal hernia present.  Bladder wall thickening likely reflects chronic outlet obstruction.   Electronically Signed   By: Irish Lack M.D.   On: 06/25/2013 18:59    Scheduled Meds: . antiseptic oral rinse  15 mL Mouth Rinse BID  . calcium-vitamin D  1 tablet Oral Daily  . carBAMazepine  200 mg Oral BID  . enalapril  5 mg Oral Daily  . lactulose  6.6667 g Oral Daily  . levothyroxine  50 mcg Oral QAC breakfast   Continuous Infusions:   Time spent: 25 minutes  Christiane Ha, M.D.  [facility] Pager [phone]. If 7PM-7AM, please contact night-coverage at [email or web address], password [facility] [address] 06/26/2013, 4:55 PM  LOS: 1 day

## 2013-06-26 NOTE — Progress Notes (Signed)
UR completed 

## 2013-06-27 MED ORDER — ESOMEPRAZOLE MAGNESIUM 40 MG PO CPDR: 40.0000 mg | DELAYED_RELEASE_CAPSULE | Freq: Every morning | ORAL | Status: DC

## 2013-06-27 NOTE — Discharge Summary (Signed)
Physician Discharge Summary  Travis Hanna MWN:[id number] DOB: Sep 25, 1955 DOA: 06/25/2013  PCP: [name]  Admit date: 06/25/2013 Discharge date: 06/27/2013  Time spent: less than 30 minutes  Discharge Diagnoses:  Principal Problem:   Colonic Ileus, mild Active Problems:   Abdominal pain, unspecified site   Down syndrome   Unspecified hypothyroidism hypertension  Discharge Condition: stable  Filed Weights   06/25/13 1533 06/25/13 2250 06/27/13 0610  Weight: 58.06 kg (128 lb) 66 kg (145 lb 8.1 oz) 64 kg (141 lb 1.5 oz)    History of present illness:  58 yo male h/o downs syndrome, nonverbal at baseline lives in a group home sent in for evaluation of abdominal pain. Reported that pt had not had a bowel movement in 4 days. No report of n/v/fevers. Pt seems to have some understanding of verbal command. Has h/o significant mental retardation. He is sitting in bed, and appears comfortable. When i asked him if he was in any pain he grimaced and sounded like he said the word pain. The CT of the abdomen and pelvis showed mild colonic ileus.  Hospital Course:  Patient was admitted to the floor, started on IV fluids, clear liquid diet, laxatives. He has had multiple bowel movements. His diet has been advanced to solids and he is tolerating this. His pain may have been related to the colonic ileus. I've also added empiric Nexium. He otherwise has remained stable and will go back to the group home today.  Procedures:  None  Consultations:  None  Discharge Exam: Filed Vitals:   06/26/13 2215  BP: 112/69  Pulse: 62  Resp: 15    General: Appears comfortable. Eating solid breakfast. Cardiovascular: Regular rate and rhythm without murmurs gallops rubs Respiratory: Clear to auscultation bilaterally without wheezes rhonchi or rales Abdomen: Normal bowel sounds. Soft, nontender, nondistended Extremities: No clubbing cyanosis or edema  Discharge Instructions  Discharge Orders    Future Orders Complete By Expires   Activity as tolerated - No restrictions  As directed    Diet - low sodium heart healthy  As directed        Medication List         acetaminophen 325 MG tablet  Commonly known as:  TYLENOL  Take 650 mg by mouth every 6 (six) hours as needed. For pain and fever     alendronate 70 MG tablet  Commonly known as:  FOSAMAX  Take 70 mg by mouth once a week. Take with a full glass of water on an empty stomach.     calcium-vitamin D 500-200 MG-UNIT per tablet  Commonly known as:  OSCAL WITH D  Take 1 tablet by mouth 2 (two) times daily.     carBAMazepine 100 MG/5ML suspension  Commonly known as:  TEGRETOL  Take 200 mg by mouth 2 (two) times daily.     CERAVE FOAMING FACIAL CLEANSER Liqd  Apply 1 application topically daily. Apply to face daily     CERAVE Lotn  Apply 1 application topically daily. Apply to face daily     enalapril 10 MG tablet  Commonly known as:  VASOTEC  Take 10 mg by mouth daily.     esomeprazole 40 MG capsule  Commonly known as:  NEXIUM  Take 1 capsule (40 mg total) by mouth every morning.     guaiFENesin 100 MG/5ML liquid  Commonly known as:  ROBITUSSIN  - Take 300 mg by mouth 3 (three) times daily as needed. For cough  -  ketoconazole 2 % shampoo  Commonly known as:  NIZORAL  Apply 1 application topically 3 (three) times a week. Apply to scalp on Monday, Wednesday and Friday     lactulose 10 GM/15ML solution  Commonly known as:  CHRONULAC  Take 10 mLs by mouth daily.     polyethylene glycol packet  Commonly known as:  MIRALAX / GLYCOLAX  Take 17 g by mouth every morning.     SYNTHROID 50 MCG tablet  Generic drug:  levothyroxine  Take 50 mcg by mouth daily before breakfast.       Allergies  Allergen Reactions  . Carbapenems   . Cephalosporins   . Penicillins        Follow-up Information   Follow up with primary care provider In 1 week.       The results of significant diagnostics from  this hospitalization (including imaging, microbiology, ancillary and laboratory) are listed below for reference.    Significant Diagnostic Studies: Dg Abd 1 View  06/25/2013   CLINICAL DATA:  No bowel movement.  Pain.  EXAM: ABDOMEN - 1 VIEW  COMPARISON:  Chest x-ray 06/08/2011.  FINDINGS: Distended loops of small and large bowel are noted, most consistent with adynamic ileus. Bowel is projected over the lower chest. This may be secondary to a protuberant abdomen. To further evaluate CT of the abdomen and pelvis can be obtained as needed. No free air noted. Severe degenerative changes and scoliosis lumbar spine. Lung bases clear.  IMPRESSION: Distended small and large bowel loops suggesting adynamic ileus. Bowel is noted projected over the lower chest, this is most likely secondary to protuberant abdomen. CT of the abdomen pelvis can be obtained for further evaluation. No free air.   Electronically Signed   By: Maisie Fus  Register   On: 06/25/2013 16:05   Ct Abdomen Pelvis W Contrast  06/25/2013   CLINICAL DATA:  Constipation and bowel distension.  EXAM: CT ABDOMEN AND PELVIS WITH CONTRAST  TECHNIQUE: Multidetector CT imaging of the abdomen and pelvis was performed using the standard protocol following bolus administration of intravenous contrast.  CONTRAST:  50mL OMNIPAQUE IOHEXOL 300 MG/ML SOLN, OMNIPAQUE IOHEXOL 300 MG/ML SOLN  COMPARISON:  DG ABDOMEN 1V dated 06/25/2013  FINDINGS: Mild gaseous distention primarily of the colon likely corresponds to ileus. No significant fecal material is identified in the colon. The small bowel is of normal caliber.  There is a moderate-sized hiatal hernia. No free fluid, free air or abscess is identified. No masses, hernias or enlarged lymph nodes are seen.  The liver, gallbladder, pancreas, spleen, adrenal glands and kidneys are unremarkable. The abdominal aorta is of normal caliber. The bladder shows circumferential wall thickening, likely reflecting chronic outlet  obstruction. The spine shows severe degenerative changes and a leftward convex scoliosis.  IMPRESSION: Mild colonic ileus pattern without significant retained fecal material.  Moderate-sized hiatal hernia present.  Bladder wall thickening likely reflects chronic outlet obstruction.   Electronically Signed   By: Irish Lack M.D.   On: 06/25/2013 18:59    Microbiology: No results found for this or any previous visit (from the past 240 hour(s)).   Labs: Basic Metabolic Panel:  Recent Labs Lab 06/25/13 1215 06/25/13 1715 06/26/13 0016  NA 141  --  138  K 4.6  --  4.1  CL 100  --  100  CO2 27  --  22  GLUCOSE 99  --  79  BUN 12  --  10  CREATININE 0.80  --  0.71  CALCIUM 9.0  --  7.9*  MG  --  2.2  --    Liver Function Tests: No results found for this basename: AST, ALT, ALKPHOS, BILITOT, PROT, ALBUMIN,  in the last 168 hours No results found for this basename: LIPASE, AMYLASE,  in the last 168 hours No results found for this basename: AMMONIA,  in the last 168 hours CBC:  Recent Labs Lab 06/25/13 1215 06/26/13 0016  WBC 6.4 5.3  NEUTROABS 5.1  --   HGB 13.1 12.2*  HCT 40.1 37.2*  MCV 113.6* 111.7*  PLT 192 PLATELET CLUMPS NOTED ON SMEAR, COUNT APPEARS ADEQUATE   Cardiac Enzymes: No results found for this basename: CKTOTAL, CKMB, CKMBINDEX, TROPONINI,  in the last 168 hours BNP: BNP (last 3 results) No results found for this basename: PROBNP,  in the last 8760 hours CBG: No results found for this basename: GLUCAP,  in the last 168 hours     Signed:  Kaisy Severino L  [facility] 06/27/2013, 8:50 AM

## 2013-06-27 NOTE — Progress Notes (Signed)
Clinical Social Work Department BRIEF PSYCHOSOCIAL ASSESSMENT 06/27/2013  Patient:  Travis Hanna,Travis B     Account Number:  [id number]     Admit date:  06/25/2013  Clinical Social Worker:  Carren RangPURITZ,Donatella Walski, LCSWA  Date/Time:  06/27/2013 10:27 AM  Referred by:  RN  Date Referred:  06/27/2013 Referred for  Other - See comment   Other Referral:   group home   Interview type:  Other - See comment Other interview type:   CSW spoke to patient's group home    PSYCHOSOCIAL DATA Living Status:  FACILITY Admitted from facility:  OTHER Level of care:  Group Home Primary support name:  Travis GravesRonald Hanna Primary support relationship to patient:  SIBLING Degree of support available:   Not assessed    CURRENT CONCERNS Current Concerns  Post-Acute Placement   Other Concerns:    SOCIAL WORK ASSESSMENT / PLAN CSW received phone call that patient is being discharged to Group Home. Covering csw unaware of patient and reviewed chart and noticed patient is from [facility]. CSW called [facility] and they confirmed patient is from their group home and they will arrange for someone to pick patient back up today.   Assessment/plan status:  Psychosocial Support/Ongoing Assessment of Needs Other assessment/ plan:   Information/referral to community resources:   CSW contact information    PATIENT'S/FAMILY'S RESPONSE TO PLAN OF CARE:      Travis KrabbeLindsay Bryton Hanna, MSW, [place] [phone]

## 2013-06-27 NOTE — Progress Notes (Addendum)
CSW spoke to Milderd Meagerheresa Johnson from patient's group home, who states that they will pick up patient around 12pm or 1pm. CSW faxed over clinicals and signing off at this time.  Maree KrabbeLindsay Caelen Reierson, MSW, Theresia MajorsLCSWA [phone]

## 2013-06-29 ENCOUNTER — Encounter (HOSPITAL_COMMUNITY): Payer: Self-pay | Admitting: Emergency Medicine

## 2013-06-29 ENCOUNTER — Emergency Department (HOSPITAL_COMMUNITY)
Admission: EM | Admit: 2013-06-29 | Discharge: 2013-06-29 | Disposition: A | Discharge status: Home / Self Care | Payer: MEDICARE | Attending: Emergency Medicine | Admitting: Emergency Medicine

## 2013-06-29 DIAGNOSIS — Z79899 Other long term (current) drug therapy: Secondary | ICD-10-CM | POA: Insufficient documentation

## 2013-06-29 DIAGNOSIS — R638 Other symptoms and signs concerning food and fluid intake: Secondary | ICD-10-CM

## 2013-06-29 DIAGNOSIS — Z880 Allergy status to penicillin: Secondary | ICD-10-CM | POA: Insufficient documentation

## 2013-06-29 DIAGNOSIS — Q909 Down syndrome, unspecified: Secondary | ICD-10-CM

## 2013-06-29 DIAGNOSIS — Z8659 Personal history of other mental and behavioral disorders: Secondary | ICD-10-CM | POA: Insufficient documentation

## 2013-06-29 LAB — COMPREHENSIVE METABOLIC PANEL
ALBUMIN: 3.5 g/dL (ref 3.5–5.2)
ALT: 11 U/L (ref 0–53)
AST: 18 U/L (ref 0–37)
Alkaline Phosphatase: 87 U/L (ref 39–117)
BUN: 5 mg/dL — ABNORMAL LOW (ref 6–23)
CALCIUM: 8.7 mg/dL (ref 8.4–10.5)
CO2: 28 mEq/L (ref 19–32)
Chloride: 96 mEq/L (ref 96–112)
Creatinine, Ser: 0.63 mg/dL (ref 0.50–1.35)
GFR calc non Af Amer: >90 mL/min (ref >90)
GLUCOSE: 80 mg/dL (ref 70–99)
Potassium: 3.7 mEq/L (ref 3.7–5.3)
Sodium: 140 mEq/L (ref 137–147)
Total Bilirubin: 0.3 mg/dL (ref 0.3–1.2)
Total Protein: 8.2 g/dL (ref 6.0–8.3)

## 2013-06-29 LAB — CBC
HEMATOCRIT: 42.3 % (ref 39.0–52.0)
Hemoglobin: 14.5 g/dL (ref 13.0–17.0)
MCH: 37.5 pg — AB (ref 26.0–34.0)
MCHC: 34.3 g/dL (ref 30.0–36.0)
MCV: 109.3 fL — ABNORMAL HIGH (ref 78.0–100.0)
Platelets: 205 10*3/uL (ref 150–400)
RBC: 3.87 MIL/uL — ABNORMAL LOW (ref 4.22–5.81)
RDW: 12.6 % (ref 11.5–15.5)
WBC: 5.2 10*3/uL (ref 4.0–10.5)

## 2013-06-29 MED ORDER — SODIUM CHLORIDE 0.9 % IV BOLUS (SEPSIS)
500.0000 mL | Freq: Once | INTRAVENOUS | Status: AC
  Administered 2013-06-29: 500 mL via INTRAVENOUS

## 2013-06-29 NOTE — ED Notes (Signed)
Pt returning here today with constipation and now per caregiver he isn't eating or drinking.

## 2013-06-29 NOTE — Discharge Instructions (Signed)
Encourage patient to drink adequate fluids. Consider supplementing diet with Ensure, or other similar, nutritional shakes. Follow up with primary care doctor for recheck in the next couple days. Return to ER if worse, new symptoms, fevers, persistent vomiting, trouble breathing, other concern.

## 2013-06-29 NOTE — ED Provider Notes (Signed)
CSN: [id number]     Arrival date & time 06/29/13  1503 History   First MD Initiated Contact with Patient 06/29/13 1538     Chief Complaint  Patient presents with  . Constipation     (Consider location/radiation/quality/duration/timing/severity/associated sxs/prior Treatment) Patient is a 58 y.o. male presenting with constipation. The history is provided by the patient and a caregiver. The history is limited by the condition of the patient.  Constipation Associated symptoms: no fever and no vomiting   pt with hx Downs syndrome, non verbal at baseline, presents from group home.  Pt w recent admission to hospital for constipation/ileus, was d/c'd back to group home 2 days ago. Group home staff indicates since return there, decreased po intake. No vomiting or diarrhea. Had bm 2 days ago. That state pts level of alertness at baseline, but not as active as normal. No fevers. No apparent pain or discomfort. No vomiting. Taking normal medication.  Pt non verbal at baseline - level 5 caveat.     Past Medical History  Diagnosis Date  . MR (mental retardation)   . Down syndrome    History reviewed. No pertinent past surgical history. History reviewed. No pertinent family history. History  Substance Use Topics  . Smoking status: Never Smoker   . Smokeless tobacco: Not on file  . Alcohol Use: No    Review of Systems  Unable to perform ROS: Patient nonverbal  Constitutional: Negative for fever.  Respiratory: Negative for cough.   Gastrointestinal: Negative for vomiting.  level 5 caveat       Allergies  Carbapenems; Cephalosporins; and Penicillins  Home Medications   Current Outpatient Rx  Name  Route  Sig  Dispense  Refill  . acetaminophen (TYLENOL) 325 MG tablet   Oral   Take 650 mg by mouth every 6 (six) hours as needed. For pain and fever          . alendronate (FOSAMAX) 70 MG tablet   Oral   Take 70 mg by mouth once a week. Take with a full glass of water on an empty  stomach.         . calcium-vitamin D (OSCAL WITH D) 500-200 MG-UNIT per tablet   Oral   Take 1 tablet by mouth 2 (two) times daily.          . carBAMazepine (TEGRETOL) 100 MG/5ML suspension   Oral   Take 200 mg by mouth 2 (two) times daily.           . enalapril (VASOTEC) 10 MG tablet   Oral   Take 10 mg by mouth daily.         Marland Kitchen esomeprazole (NEXIUM) 40 MG capsule   Oral   Take 1 capsule (40 mg total) by mouth every morning.   30 capsule   0   . guaiFENesin (ROBITUSSIN) 100 MG/5ML liquid   Oral   Take 300 mg by mouth 3 (three) times daily as needed. For cough           . ketoconazole (NIZORAL) 2 % shampoo   Topical   Apply 1 application topically 3 (three) times a week. Apply to scalp on Monday, Wednesday and Friday         . lactulose (CHRONULAC) 10 GM/15ML solution   Oral   Take 10 mLs by mouth daily.           Marland Kitchen levothyroxine (SYNTHROID) 50 MCG tablet   Oral   Take 50 mcg by mouth  daily before breakfast.           BP 152/85  Pulse 84  Temp(Src) 97.8 F (36.6 C) (Oral)  Resp 19  Wt 135 lb (61.236 kg)  SpO2 95% Physical Exam  Nursing note and vitals reviewed. Constitutional: He appears well-developed and well-nourished. No distress.  HENT:  Head: Atraumatic.  Mouth/Throat: Oropharynx is clear and moist.  Eyes: Pupils are equal, round, and reactive to light. No scleral icterus.  Eyes deviated medially, L>R (baseline for pt)  Neck: Neck supple. No tracheal deviation present.  Cardiovascular: Normal rate, regular rhythm, normal heart sounds and intact distal pulses.   Pulmonary/Chest: Effort normal and breath sounds normal. No accessory muscle usage. No respiratory distress.  Abdominal: Soft. Bowel sounds are normal. He exhibits no distension and no mass. There is no tenderness. There is no rebound and no guarding.  Genitourinary:  No cva tenderness Soft yellowish light brown stool, no impaction felt.   Musculoskeletal: Normal range of  motion. He exhibits no edema and no tenderness.  Neurological: He is alert.  Moves bil ext purposefully.   Skin: Skin is warm and dry. No rash noted. He is not diaphoretic.  Psychiatric:  Alert, content. Staff notes appears c/w baseline.     ED Course  Procedures (including critical care time)   Results for orders placed during the hospital encounter of 06/29/13  CBC      Result Value Ref Range   WBC 5.2  4.0 - 10.5 K/uL   RBC 3.87 (*) 4.22 - 5.81 MIL/uL   Hemoglobin 14.5  13.0 - 17.0 g/dL   HCT 16.1  09.6 - 04.5 %   MCV 109.3 (*) 78.0 - 100.0 fL   MCH 37.5 (*) 26.0 - 34.0 pg   MCHC 34.3  30.0 - 36.0 g/dL   RDW 40.9  81.1 - 91.4 %   Platelets 205  150 - 400 K/uL  COMPREHENSIVE METABOLIC PANEL      Result Value Ref Range   Sodium 140  137 - 147 mEq/L   Potassium 3.7  3.7 - 5.3 mEq/L   Chloride 96  96 - 112 mEq/L   CO2 28  19 - 32 mEq/L   Glucose, Bld 80  70 - 99 mg/dL   BUN 5 (*) 6 - 23 mg/dL   Creatinine, Ser 7.82  0.50 - 1.35 mg/dL   Calcium 8.7  8.4 - 95.6 mg/dL   Total Protein 8.2  6.0 - 8.3 g/dL   Albumin 3.5  3.5 - 5.2 g/dL   AST 18  0 - 37 U/L   ALT 11  0 - 53 U/L   Alkaline Phosphatase 87  39 - 117 U/L   Total Bilirubin 0.3  0.3 - 1.2 mg/dL   GFR calc non Af Amer >90  >90 mL/min   GFR calc Af Amer >90  >90 mL/min   Dg Abd 1 View  06/25/2013   CLINICAL DATA:  No bowel movement.  Pain.  EXAM: ABDOMEN - 1 VIEW  COMPARISON:  Chest x-ray 06/08/2011.  FINDINGS: Distended loops of small and large bowel are noted, most consistent with adynamic ileus. Bowel is projected over the lower chest. This may be secondary to a protuberant abdomen. To further evaluate CT of the abdomen and pelvis can be obtained as needed. No free air noted. Severe degenerative changes and scoliosis lumbar spine. Lung bases clear.  IMPRESSION: Distended small and large bowel loops suggesting adynamic ileus. Bowel is noted projected over the lower  chest, this is most likely secondary to protuberant  abdomen. CT of the abdomen pelvis can be obtained for further evaluation. No free air.   Electronically Signed   By: Maisie Fushomas  Register   On: 06/25/2013 16:05   Ct Abdomen Pelvis W Contrast  06/25/2013   CLINICAL DATA:  Constipation and bowel distension.  EXAM: CT ABDOMEN AND PELVIS WITH CONTRAST  TECHNIQUE: Multidetector CT imaging of the abdomen and pelvis was performed using the standard protocol following bolus administration of intravenous contrast.  CONTRAST:  50mL OMNIPAQUE IOHEXOL 300 MG/ML SOLN, 100mL OMNIPAQUE IOHEXOL 300 MG/ML SOLN  COMPARISON:  DG ABDOMEN 1V dated 06/25/2013  FINDINGS: Mild gaseous distention primarily of the colon likely corresponds to ileus. No significant fecal material is identified in the colon. The small bowel is of normal caliber.  There is a moderate-sized hiatal hernia. No free fluid, free air or abscess is identified. No masses, hernias or enlarged lymph nodes are seen.  The liver, gallbladder, pancreas, spleen, adrenal glands and kidneys are unremarkable. The abdominal aorta is of normal caliber. The bladder shows circumferential wall thickening, likely reflecting chronic outlet obstruction. The spine shows severe degenerative changes and a leftward convex scoliosis.  IMPRESSION: Mild colonic ileus pattern without significant retained fecal material.  Moderate-sized hiatal hernia present.  Bladder wall thickening likely reflects chronic outlet obstruction.   Electronically Signed   By: Irish LackGlenn  Yamagata M.D.   On: 06/25/2013 18:59      MDM  Iv ns bolus.   Labs.  Reviewed nursing notes and prior charts for additional history.   Reviewed recent labs, ua neg x for being concentrated/ketones. Recent xr/ct neg acute x mild ileus.    Pt in no apparent distress or discomfort.   No sob or increased wob.   Tolerating po fluids.   abd soft nt.   Pt appears stable for d/c.     Suzi RootsKevin E Miaya Lafontant, MD 06/29/13 [phone]

## 2013-06-29 NOTE — ED Notes (Signed)
Pt initally did not want to drink fluids (ginger ale) but with encouragement he was able to drink. Pt took several sips of the drink then refused again. Pt caregiver in room, EDP discussing plan of care with caregiver.

## 2013-06-29 NOTE — ED Notes (Addendum)
Patient to ED with C/O not eating or drinking for 2 days. Denies nausea, vomiting, diarrhea, pain and fever.  Patient was seen in the ED on Friday.

## 2013-09-04 ENCOUNTER — Emergency Department (HOSPITAL_BASED_OUTPATIENT_CLINIC_OR_DEPARTMENT_OTHER)
Admission: EM | Admit: 2013-09-04 | Discharge: 2013-09-04 | Disposition: A | Discharge status: Home / Self Care | Payer: MEDICARE | Attending: Emergency Medicine | Admitting: Emergency Medicine

## 2013-09-04 ENCOUNTER — Emergency Department (HOSPITAL_BASED_OUTPATIENT_CLINIC_OR_DEPARTMENT_OTHER): Payer: MEDICARE

## 2013-09-04 ENCOUNTER — Encounter (HOSPITAL_BASED_OUTPATIENT_CLINIC_OR_DEPARTMENT_OTHER): Payer: Self-pay | Admitting: Emergency Medicine

## 2013-09-04 DIAGNOSIS — S80212A Abrasion, left knee, initial encounter: Secondary | ICD-10-CM

## 2013-09-04 DIAGNOSIS — S8000XA Contusion of unspecified knee, initial encounter: Secondary | ICD-10-CM | POA: Insufficient documentation

## 2013-09-04 DIAGNOSIS — Y9210 Unspecified residential institution as the place of occurrence of the external cause: Secondary | ICD-10-CM | POA: Insufficient documentation

## 2013-09-04 DIAGNOSIS — Z8659 Personal history of other mental and behavioral disorders: Secondary | ICD-10-CM | POA: Insufficient documentation

## 2013-09-04 DIAGNOSIS — Z79899 Other long term (current) drug therapy: Secondary | ICD-10-CM | POA: Insufficient documentation

## 2013-09-04 DIAGNOSIS — Y939 Activity, unspecified: Secondary | ICD-10-CM | POA: Insufficient documentation

## 2013-09-04 DIAGNOSIS — S8002XA Contusion of left knee, initial encounter: Secondary | ICD-10-CM

## 2013-09-04 DIAGNOSIS — Z862 Personal history of diseases of the blood and blood-forming organs and certain disorders involving the immune mechanism: Secondary | ICD-10-CM | POA: Insufficient documentation

## 2013-09-04 DIAGNOSIS — Z8669 Personal history of other diseases of the nervous system and sense organs: Secondary | ICD-10-CM | POA: Insufficient documentation

## 2013-09-04 DIAGNOSIS — Q909 Down syndrome, unspecified: Secondary | ICD-10-CM | POA: Insufficient documentation

## 2013-09-04 DIAGNOSIS — M810 Age-related osteoporosis without current pathological fracture: Secondary | ICD-10-CM | POA: Insufficient documentation

## 2013-09-04 DIAGNOSIS — X58XXXA Exposure to other specified factors, initial encounter: Secondary | ICD-10-CM | POA: Insufficient documentation

## 2013-09-04 DIAGNOSIS — Z880 Allergy status to penicillin: Secondary | ICD-10-CM | POA: Insufficient documentation

## 2013-09-04 HISTORY — DX: Myopia, unspecified eye: H52.10

## 2013-09-04 HISTORY — DX: Age-related osteoporosis without current pathological fracture: M81.0

## 2013-09-04 HISTORY — DX: Unspecified astigmatism, unspecified eye: H52.209

## 2013-09-04 HISTORY — DX: Anemia, unspecified: D64.9

## 2013-09-04 NOTE — Discharge Instructions (Signed)
Contusion °A contusion is a deep bruise. Contusions are the result of an injury that caused bleeding under the skin. The contusion may turn blue, purple, or yellow. Minor injuries will give you a painless contusion, but more severe contusions may stay painful and swollen for a few weeks.  °CAUSES  °A contusion is usually caused by a blow, trauma, or direct force to an area of the body. °SYMPTOMS  °· Swelling and redness of the injured area. °· Bruising of the injured area. °· Tenderness and soreness of the injured area. °· Pain. °DIAGNOSIS  °The diagnosis can be made by taking a history and physical exam. An X-ray, CT scan, or MRI may be needed to determine if there were any associated injuries, such as fractures. °TREATMENT  °Specific treatment will depend on what area of the body was injured. In general, the best treatment for a contusion is resting, icing, elevating, and applying cold compresses to the injured area. Over-the-counter medicines may also be recommended for pain control. Ask your caregiver what the best treatment is for your contusion. °HOME CARE INSTRUCTIONS  °· Put ice on the injured area. °· Put ice in a plastic bag. °· Place a towel between your skin and the bag. °· Leave the ice on for 15-20 minutes, 03-04 times a day. °· Only take over-the-counter or prescription medicines for pain, discomfort, or fever as directed by your caregiver. Your caregiver may recommend avoiding anti-inflammatory medicines (aspirin, ibuprofen, and naproxen) for 48 hours because these medicines may increase bruising. °· Rest the injured area. °· If possible, elevate the injured area to reduce swelling. °SEEK IMMEDIATE MEDICAL CARE IF:  °· You have increased bruising or swelling. °· You have pain that is getting worse. °· Your swelling or pain is not relieved with medicines. °MAKE SURE YOU:  °· Understand these instructions. °· Will watch your condition. °· Will get help right away if you are not doing well or get  worse. °Document Released: 02/01/2005 Document Revised: 07/17/2011 Document Reviewed: 02/27/2011 °ExitCare® Patient Information ©2014 [employer]. ° °Abrasion °An abrasion is a cut or scrape of the skin. Abrasions do not extend through all layers of the skin and most heal within 10 days. It is important to care for your abrasion properly to prevent infection. °CAUSES  °Most abrasions are caused by falling on, or gliding across, the ground or other surface. When your skin rubs on something, the outer and inner layer of skin rubs off, causing an abrasion. °DIAGNOSIS  °Your caregiver will be able to diagnose an abrasion during a physical exam.  °TREATMENT  °Your treatment depends on how large and deep the abrasion is. Generally, your abrasion will be cleaned with water and a mild soap to remove any dirt or debris. An antibiotic ointment may be put over the abrasion to prevent an infection. A bandage (dressing) may be wrapped around the abrasion to keep it from getting dirty.  °You may need a tetanus shot if: °· You cannot remember when you had your last tetanus shot. °· You have never had a tetanus shot. °· The injury broke your skin. °If you get a tetanus shot, your arm may swell, get red, and feel warm to the touch. This is common and not a problem. If you need a tetanus shot and you choose not to have one, there is a rare chance of getting tetanus. Sickness from tetanus can be serious.  °HOME CARE INSTRUCTIONS  °· If a dressing was applied, change it at   least once a day or as directed by your caregiver. If the bandage sticks, soak it off with warm water.   °· Wash the area with water and a mild soap to remove all the ointment 2 times a day. Rinse off the soap and pat the area dry with a clean towel.   °· Reapply any ointment as directed by your caregiver. This will help prevent infection and keep the bandage from sticking. Use gauze over the wound and under the dressing to help keep the bandage from sticking.    °· Change your dressing right away if it becomes wet or dirty.   °· Only take over-the-counter or prescription medicines for pain, discomfort, or fever as directed by your caregiver.   °· Follow up with your caregiver within 24 48 hours for a wound check, or as directed. If you were not given a wound-check appointment, look closely at your abrasion for redness, swelling, or pus. These are signs of infection. °SEEK IMMEDIATE MEDICAL CARE IF:  °· You have increasing pain in the wound.   °· You have redness, swelling, or tenderness around the wound.   °· You have pus coming from the wound.   °· You have a fever or persistent symptoms for more than 2 3 days. °· You have a fever and your symptoms suddenly get worse. °· You have a bad smell coming from the wound or dressing.   °MAKE SURE YOU:  °· Understand these instructions. °· Will watch your condition. °· Will get help right away if you are not doing well or get worse. °Document Released: 02/01/2005 Document Revised: 04/10/2012 Document Reviewed: 03/28/2011 °ExitCare® Patient Information ©2014 [employer]. ° °

## 2013-09-04 NOTE — ED Provider Notes (Signed)
Medical screening examination/treatment/procedure(s) were performed by non-physician practitioner and as supervising physician I was immediately available for consultation/collaboration.     Orpha Dain, MD 09/04/13 2305 

## 2013-09-04 NOTE — ED Provider Notes (Signed)
CSN: [id number]     Arrival date & time 09/04/13  2051 History   None    Chief Complaint  Patient presents with  . Knee Pain     (Consider location/radiation/quality/duration/timing/severity/associated sxs/prior Treatment) Patient is a 58 y.o. male presenting with knee pain. The history is provided by the patient. The history is limited by a developmental delay. No language interpreter was used.  Knee Pain Location:  Knee Injury: yes   Knee location:  L knee Pain details:    Quality:  Aching Chronicity:  New Dislocation: no   Foreign body present:  No foreign bodies Tetanus status:  Up to date Worsened by:  Nothing tried Ineffective treatments:  None tried Risk factors: no concern for non-accidental trauma   Pt has Down's syndrome.  Complains of knee pain with abrasion.  Injury not witnessed  Past Medical History  Diagnosis Date  . MR (mental retardation)   . Down syndrome   . Anemia   . Myopia with astigmatism   . Osteoporosis    History reviewed. No pertinent past surgical history. No family history on file. History  Substance Use Topics  . Smoking status: Never Smoker   . Smokeless tobacco: Not on file  . Alcohol Use: No    Review of Systems  All other systems reviewed and are negative.     Allergies  Carbapenems; Cephalosporins; and Penicillins  Home Medications   Prior to Admission medications   Medication Sig Start Date End Date Taking? Authorizing Provider  acetaminophen (TYLENOL) 325 MG tablet Take 650 mg by mouth every 6 (six) hours as needed. For pain and fever     Historical Provider, MD  alendronate (FOSAMAX) 70 MG tablet Take 70 mg by mouth once a week. Take with a full glass of water on an empty stomach.    Historical Provider, MD  calcium-vitamin D (OSCAL WITH D) 500-200 MG-UNIT per tablet Take 1 tablet by mouth 2 (two) times daily.     Historical Provider, MD  carBAMazepine (TEGRETOL) 100 MG/5ML suspension Take 200 mg by mouth 2 (two) times  daily.      Historical Provider, MD  enalapril (VASOTEC) 10 MG tablet Take 10 mg by mouth daily.    Historical Provider, MD  esomeprazole (NEXIUM) 40 MG capsule Take 1 capsule (40 mg total) by mouth every morning. 06/27/13   Christiane Haorinna L Sullivan, MD  guaiFENesin (ROBITUSSIN) 100 MG/5ML liquid Take 300 mg by mouth 3 (three) times daily as needed. For cough      Historical Provider, MD  ketoconazole (NIZORAL) 2 % shampoo Apply 1 application topically 3 (three) times a week. Apply to scalp on Monday, Wednesday and Friday    Historical Provider, MD  lactulose (CHRONULAC) 10 GM/15ML solution Take 10 mLs by mouth daily.      Historical Provider, MD  levothyroxine (SYNTHROID) 50 MCG tablet Take 50 mcg by mouth daily before breakfast.     Historical Provider, MD   BP 137/93  Pulse 78  Temp(Src) 98.2 F (36.8 C) (Oral)  Resp 16  Ht 4\' 9"  (1.448 m)  Wt 128 lb (58.06 kg)  BMI 27.69 kg/m2  SpO2 100% Physical Exam  Nursing note and vitals reviewed. Constitutional: He is oriented to person, place, and time. He appears well-developed and well-nourished.  HENT:  Head: Normocephalic.  Eyes: EOM are normal.  Neck: Normal range of motion.  Pulmonary/Chest: Effort normal.  Abdominal: He exhibits no distension.  Musculoskeletal: Normal range of motion. He exhibits tenderness.  Abrasion left knee  Neurological: He is alert and oriented to person, place, and time.  Psychiatric: He has a normal mood and affect.    ED Course  Procedures (including critical care time) Labs Review Labs Reviewed - No data to display  Imaging Review No results found.   EKG Interpretation None      MDM   Final diagnoses:  Contusion of left knee  Abrasion of left knee    Ice,   Ace wrap    Elson AreasLeslie K Sofia, PA-C 09/04/13 2201

## 2013-09-04 NOTE — ED Notes (Signed)
Per caregiver [facility], states given pt a bath today and noticed a scrap to lt knee, pt is nonverbal but grimacing to touch; no know fall or injury; pt is ambulatory with no difficulty

## 2013-09-04 NOTE — ED Notes (Signed)
Patient transported to X-ray 

## 2014-02-14 ENCOUNTER — Emergency Department (HOSPITAL_COMMUNITY)
Admission: EM | Admit: 2014-02-14 | Discharge: 2014-02-14 | Discharge status: Absent without leave | Payer: MEDICARE | Source: Home / Self Care

## 2014-02-14 ENCOUNTER — Emergency Department (HOSPITAL_COMMUNITY)
Admission: EM | Admit: 2014-02-14 | Discharge: 2014-02-14 | Discharge status: Against Medical Advice | Payer: MEDICARE | Attending: Emergency Medicine | Admitting: Emergency Medicine

## 2014-02-14 ENCOUNTER — Encounter (HOSPITAL_COMMUNITY): Payer: Self-pay | Admitting: Emergency Medicine

## 2014-02-14 ENCOUNTER — Emergency Department (HOSPITAL_COMMUNITY): Payer: MEDICARE

## 2014-02-14 DIAGNOSIS — M810 Age-related osteoporosis without current pathological fracture: Secondary | ICD-10-CM | POA: Insufficient documentation

## 2014-02-14 DIAGNOSIS — Z8669 Personal history of other diseases of the nervous system and sense organs: Secondary | ICD-10-CM | POA: Diagnosis not present

## 2014-02-14 DIAGNOSIS — Z862 Personal history of diseases of the blood and blood-forming organs and certain disorders involving the immune mechanism: Secondary | ICD-10-CM | POA: Diagnosis not present

## 2014-02-14 DIAGNOSIS — Z880 Allergy status to penicillin: Secondary | ICD-10-CM | POA: Insufficient documentation

## 2014-02-14 DIAGNOSIS — Z7952 Long term (current) use of systemic steroids: Secondary | ICD-10-CM | POA: Diagnosis not present

## 2014-02-14 DIAGNOSIS — Q909 Down syndrome, unspecified: Secondary | ICD-10-CM | POA: Diagnosis not present

## 2014-02-14 DIAGNOSIS — Y92410 Unspecified street and highway as the place of occurrence of the external cause: Secondary | ICD-10-CM | POA: Insufficient documentation

## 2014-02-14 DIAGNOSIS — F79 Unspecified intellectual disabilities: Secondary | ICD-10-CM | POA: Insufficient documentation

## 2014-02-14 DIAGNOSIS — S3992XA Unspecified injury of lower back, initial encounter: Secondary | ICD-10-CM | POA: Diagnosis not present

## 2014-02-14 DIAGNOSIS — Z791 Long term (current) use of non-steroidal anti-inflammatories (NSAID): Secondary | ICD-10-CM | POA: Diagnosis not present

## 2014-02-14 DIAGNOSIS — Z79899 Other long term (current) drug therapy: Secondary | ICD-10-CM | POA: Insufficient documentation

## 2014-02-14 DIAGNOSIS — Y9389 Activity, other specified: Secondary | ICD-10-CM | POA: Diagnosis not present

## 2014-02-14 MED ORDER — OXYCODONE-ACETAMINOPHEN 5-325 MG PO TABS
2.0000 | ORAL_TABLET | Freq: Once | ORAL | Status: AC
  Administered 2014-02-14: 2 via ORAL
  Filled 2014-02-14: qty 2

## 2014-02-14 MED ORDER — IBUPROFEN 800 MG PO TABS
800.0000 mg | ORAL_TABLET | Freq: Once | ORAL | Status: AC
  Administered 2014-02-14: 800 mg via ORAL
  Filled 2014-02-14: qty 1

## 2014-02-14 NOTE — ED Notes (Signed)
Spoke with nurse from [facility] who states she doesn't want patient to have a CT scan. MD notified and states patient will be leaving AMA.

## 2014-02-14 NOTE — ED Provider Notes (Signed)
CSN: [id number]     Arrival date & time 02/14/14  1445 History   First MD Initiated Contact with Patient 02/14/14 1754     Chief Complaint  Patient presents with  . Fall     (Consider location/radiation/quality/duration/timing/severity/associated sxs/prior Treatment) The history is provided by a caregiver.  Travis Hanna is a 58 y.o. male hx of mental retardation, down syndrome, autism here with fall. He was getting into a van several hours ago and slipped and fell on his back. Denies head injury or syncope or LOC. Denies chest pain or shortness of breath or abdominal pain. Was able to walk with assistance. Patient only can answer yes or no questions. History as per aid.   Level V caveat- mental retardation.    Past Medical History  Diagnosis Date  . MR (mental retardation)   . Down syndrome   . Anemia   . Myopia with astigmatism   . Osteoporosis    History reviewed. No pertinent past surgical history. History reviewed. No pertinent family history. History  Substance Use Topics  . Smoking status: Never Smoker   . Smokeless tobacco: Not on file  . Alcohol Use: No    Review of Systems  Musculoskeletal: Positive for back pain.  All other systems reviewed and are negative.     Allergies  Carbapenems; Cephalosporins; Other; Penicillamine; and Penicillins  Home Medications   Prior to Admission medications   Medication Sig Start Date End Date Taking? Authorizing Provider  alendronate (FOSAMAX) 70 MG tablet Take 70 mg by mouth once a week. Take with a full glass of water on an empty stomach. (on Fridays)   Yes Historical Provider, MD  calcium-vitamin D (OSCAL WITH D) 500-200 MG-UNIT per tablet Take 1 tablet by mouth 2 (two) times daily.    Yes Historical Provider, MD  carBAMazepine (TEGRETOL) 100 MG/5ML suspension Take 200 mg by mouth 2 (two) times daily.    Yes Historical Provider, MD  Emollient (CERAVE) LOTN Apply 1 application topically daily.   Yes Historical  Provider, MD  enalapril (VASOTEC) 10 MG tablet Take 10 mg by mouth daily.   Yes Historical Provider, MD  esomeprazole (NEXIUM) 40 MG capsule Take 40 mg by mouth at bedtime.   Yes Historical Provider, MD  ketoconazole (NIZORAL) 2 % shampoo Apply 1 application topically 3 (three) times a week. Apply to scalp on Monday, Wednesday and Friday   Yes Historical Provider, MD  lactulose (CHRONULAC) 10 GM/15ML solution Take 20 g by mouth daily.    Yes Historical Provider, MD  levothyroxine (SYNTHROID) 50 MCG tablet Take 50 mcg by mouth daily before breakfast.    Yes Historical Provider, MD  mometasone (ELOCON) 0.1 % cream Apply 1 application topically daily.   Yes Historical Provider, MD  nabumetone (RELAFEN) 500 MG tablet Take 500 mg by mouth daily.  02/04/14  Yes Historical Provider, MD  polyethylene glycol (MIRALAX / GLYCOLAX) packet Take 17 g by mouth daily. Mix in 4 oz of liquid and drink   Yes Historical Provider, MD  Polyvinyl Alcohol (LIQUID TEARS OP) Place 1 drop into both eyes 3 (three) times daily.   Yes Historical Provider, MD  Vitamin D, Ergocalciferol, (DRISDOL) 50000 UNITS CAPS capsule Take 50,000 Units by mouth every 7 (seven) days. On Saturdays   Yes Historical Provider, MD   BP 119/77  Pulse 55  Temp(Src) 98.1 F (36.7 C) (Oral)  Resp 16  SpO2 99% Physical Exam  Nursing note and vitals reviewed. Constitutional:  Nonverbal, slightly  uncomfortable   HENT:  Head: Normocephalic and atraumatic.  Mouth/Throat: Oropharynx is clear and moist.  Eyes: Conjunctivae and EOM are normal. Pupils are equal, round, and reactive to light.  Neck: Normal range of motion. Neck supple.  Cardiovascular: Normal rate, regular rhythm and normal heart sounds.   Pulmonary/Chest: Effort normal and breath sounds normal. No respiratory distress. He has no wheezes. He has no rales.  Abdominal: Soft. Bowel sounds are normal. He exhibits no distension. There is no tenderness. There is no rebound and no guarding.   Musculoskeletal: Normal range of motion.  Minimal lower lumbar tenderness   Neurological: He is alert.  Nonverbal, moving all extremities. Neg straight leg raise. No saddle anesthesia.   Skin: Skin is warm and dry.  Psychiatric: He has a normal mood and affect. His behavior is normal. Judgment and thought content normal.    ED Course  Procedures (including critical care time) Labs Review Labs Reviewed - No data to display  Imaging Review Dg Lumbar Spine Complete  02/14/2014   EXAM: LUMBAR SPINE - COMPLETE 4+ VIEW  COMPARISON:  CT 06/25/2013.  FINDINGS: The lumbar spine is very difficult to evaluate for fracture due to severe degenerative change and scoliosis concave right. Similar findings noted on prior CT of 06/25/2013. Compression fractures of L2 and L3 cannot be excluded.  IMPRESSION: Lumbar spine very difficult evaluate for fracture due to severe degenerative changes and severe scoliosis. Similar findings noted on prior CT of 06/25/2013. Compression fractures of L2 and L3 cannot be completely excluded.   Electronically Signed   By: Maisie Fushomas  Register   On: 02/14/2014 19:04     EKG Interpretation None      MDM   Final diagnoses:  None    Travis Hanna is a 58 y.o. male here with back injury. Will get xray to r/o fracture. Will give pain meds. No signs of spinal cord compression.   7:29 PM Diffult xray. Possible L2 and L3 compression fractures, not certain if new. Patient comfortable after pain meds. I ordered CT to better clarify. However, aid doesn't want him to wait here. She wants to take him back to facility. She understand risks including possible spinal fracture causing spinal cord compression causing disability and inability to walk.     Richardean Canalavid H Rhyli Depaula, MD 02/14/14 [phone]

## 2014-02-14 NOTE — ED Notes (Signed)
Caregiver reports pt missed a step when stepping into van and had a fall earlier today. Denies loc. Pt is special needs and unable to vocalize what is hurting him but appears to be in pain.

## 2014-02-14 NOTE — ED Notes (Signed)
Pt off floor for xray

## 2014-02-14 NOTE — Discharge Instructions (Signed)
Take tylenol, motrin for pain.   You may have a spinal fracture which may cause compression of your spine cord. You may need to get CT or MRI with your doctor.   You are signing out against medical advice.   Return to ER if you have trouble walking, severe pain, numbness or weakness in the legs.

## 2014-02-14 NOTE — ED Notes (Signed)
Pt is MR and doesn't verbally communicate.  Pt fell this AM and grimiced when MD assessed lower middle back.

## 2014-02-14 NOTE — ED Notes (Signed)
MD at bedside to speak with family.

## 2016-07-21 ENCOUNTER — Encounter (HOSPITAL_COMMUNITY): Payer: Self-pay | Admitting: Emergency Medicine

## 2016-07-21 ENCOUNTER — Emergency Department (HOSPITAL_COMMUNITY): Payer: MEDICARE

## 2016-07-21 ENCOUNTER — Emergency Department (HOSPITAL_COMMUNITY)
Admission: EM | Admit: 2016-07-21 | Discharge: 2016-07-21 | Disposition: A | Discharge status: Home / Self Care | Payer: MEDICARE | Attending: Emergency Medicine | Admitting: Emergency Medicine

## 2016-07-21 DIAGNOSIS — Z79899 Other long term (current) drug therapy: Secondary | ICD-10-CM | POA: Diagnosis not present

## 2016-07-21 DIAGNOSIS — R531 Weakness: Secondary | ICD-10-CM | POA: Insufficient documentation

## 2016-07-21 DIAGNOSIS — R2232 Localized swelling, mass and lump, left upper limb: Secondary | ICD-10-CM | POA: Diagnosis present

## 2016-07-21 LAB — COMPREHENSIVE METABOLIC PANEL
ALBUMIN: 3.2 g/dL — AB (ref 3.5–5.0)
ALT: 14 U/L — ABNORMAL LOW (ref 17–63)
AST: 28 U/L (ref 15–41)
Alkaline Phosphatase: 65 U/L (ref 38–126)
Anion gap: 4 — ABNORMAL LOW (ref 5–15)
BUN: 27 mg/dL — ABNORMAL HIGH (ref 6–20)
CALCIUM: 9.1 mg/dL (ref 8.9–10.3)
CO2: 33 mmol/L — ABNORMAL HIGH (ref 22–32)
Chloride: 104 mmol/L (ref 101–111)
Creatinine, Ser: 0.98 mg/dL (ref 0.61–1.24)
GFR calc non Af Amer: >60 mL/min (ref >60)
GLUCOSE: 79 mg/dL (ref 65–99)
POTASSIUM: 5.2 mmol/L — AB (ref 3.5–5.1)
SODIUM: 141 mmol/L (ref 135–145)
TOTAL PROTEIN: 6.9 g/dL (ref 6.5–8.1)
Total Bilirubin: 0.7 mg/dL (ref 0.3–1.2)

## 2016-07-21 LAB — URINALYSIS, ROUTINE W REFLEX MICROSCOPIC
Bilirubin Urine: NEGATIVE
GLUCOSE, UA: NEGATIVE mg/dL
Hgb urine dipstick: NEGATIVE
KETONES UR: NEGATIVE mg/dL
LEUKOCYTES UA: NEGATIVE
NITRITE: NEGATIVE
Protein, ur: NEGATIVE mg/dL
Specific Gravity, Urine: 1.011 (ref 1.005–1.030)
pH: 7 (ref 5.0–8.0)

## 2016-07-21 LAB — CBC
HCT: 39.3 % (ref 39.0–52.0)
Hemoglobin: 12.9 g/dL — ABNORMAL LOW (ref 13.0–17.0)
MCH: 35.4 pg — AB (ref 26.0–34.0)
MCHC: 32.8 g/dL (ref 30.0–36.0)
MCV: 108 fL — ABNORMAL HIGH (ref 78.0–100.0)
Platelets: 241 10*3/uL (ref 150–400)
RBC: 3.64 MIL/uL — ABNORMAL LOW (ref 4.22–5.81)
RDW: 12.8 % (ref 11.5–15.5)
WBC: 4.5 10*3/uL (ref 4.0–10.5)

## 2016-07-21 LAB — I-STAT CG4 LACTIC ACID, ED: Lactic Acid, Venous: 2.11 mmol/L (ref 0.5–1.9)

## 2016-07-21 MED ORDER — SODIUM CHLORIDE 0.9 % IV BOLUS (SEPSIS)
1000.0000 mL | Freq: Once | INTRAVENOUS | Status: AC
  Administered 2016-07-21: 1000 mL via INTRAVENOUS

## 2016-07-21 NOTE — ED Triage Notes (Signed)
Patient from group home with caregiver.  Per caregiver patient has lump on left side of back.  Caregiver states she thought this was always there but was told to bring him to ER.

## 2016-07-23 NOTE — ED Provider Notes (Signed)
[facility]-EMERGENCY DEPT Provider Note   CSN: [id number] Arrival date & time: 07/21/16  1317     History   Chief Complaint Chief Complaint  Patient presents with  . Abscess    Level V caveat: Developmental delay   HPI Travis Hanna is a 61 y.o. male.  HPI 61yo male from a group home with a hx of down syndrome presenting with possible mass near his left scapula region. Caretaker with him now reports he has always had an occasional area of swelling of this left scapular region. She reports the pt is in the ER for decreased energy and activity over the past several days with decreased appetite. No reported fevers or cough. No reported vomiting or diarrhea. No new rashes   Past Medical History:  Diagnosis Date  . Anemia   . Down syndrome   . MR (mental retardation)   . Myopia with astigmatism   . Osteoporosis     Patient Active Problem List   Diagnosis Date Noted  . Unspecified hypothyroidism 06/26/2013  . Ileus (HCC) 06/25/2013  . Abdominal pain, unspecified site 06/25/2013  . Down syndrome 06/25/2013  . Constipation 06/25/2013    History reviewed. No pertinent surgical history.     Home Medications    Prior to Admission medications   Medication Sig Start Date End Date Taking? Authorizing Provider  alendronate (FOSAMAX) 70 MG tablet Take 70 mg by mouth once a week. Take with a full glass of water on an empty stomach. (on Fridays)    Historical Provider, MD  calcium-vitamin D (OSCAL WITH D) 500-200 MG-UNIT per tablet Take 1 tablet by mouth 2 (two) times daily.     Historical Provider, MD  carBAMazepine (TEGRETOL) 100 MG/5ML suspension Take 200 mg by mouth 2 (two) times daily.     Historical Provider, MD  Emollient (CERAVE) LOTN Apply 1 application topically daily.    Historical Provider, MD  enalapril (VASOTEC) 10 MG tablet Take 10 mg by mouth daily.    Historical Provider, MD  esomeprazole (NEXIUM) 40 MG capsule Take 40 mg by mouth at bedtime.    Historical  Provider, MD  ketoconazole (NIZORAL) 2 % shampoo Apply 1 application topically 3 (three) times a week. Apply to scalp on Monday, Wednesday and Friday    Historical Provider, MD  lactulose (CHRONULAC) 10 GM/15ML solution Take 20 g by mouth daily.     Historical Provider, MD  levothyroxine (SYNTHROID) 50 MCG tablet Take 50 mcg by mouth daily before breakfast.     Historical Provider, MD  mometasone (ELOCON) 0.1 % cream Apply 1 application topically daily.    Historical Provider, MD  nabumetone (RELAFEN) 500 MG tablet Take 500 mg by mouth daily.  02/04/14   Historical Provider, MD  polyethylene glycol (MIRALAX / GLYCOLAX) packet Take 17 g by mouth daily. Mix in 4 oz of liquid and drink    Historical Provider, MD  Polyvinyl Alcohol (LIQUID TEARS OP) Place 1 drop into both eyes 3 (three) times daily.    Historical Provider, MD  Vitamin D, Ergocalciferol, (DRISDOL) 50000 UNITS CAPS capsule Take 50,000 Units by mouth every 7 (seven) days. On Saturdays    Historical Provider, MD    Family History History reviewed. No pertinent family history.  Social History Social History  Substance Use Topics  . Smoking status: Never Smoker  . Smokeless tobacco: Never Used  . Alcohol use No     Allergies   Carbapenems; Cephalosporins; Other; Penicillamine; and Penicillins   Review of  Systems Review of Systems  Unable to perform ROS: Patient nonverbal     Physical Exam Updated Vital Signs BP 107/62 (BP Location: Right Arm)   Pulse (!) 59   Temp 98.5 F (36.9 C) (Rectal)   Resp 18   Ht 5\' 2"  (1.575 m)   Wt 120 lb (54.4 kg)   SpO2 99%   BMI 21.95 kg/m   Physical Exam  Constitutional: He appears well-developed and well-nourished.  HENT:  Head: Normocephalic and atraumatic.  Eyes: EOM are normal.  Neck: Normal range of motion.  Cardiovascular: Normal rate, regular rhythm, normal heart sounds and intact distal pulses.   Pulmonary/Chest: Effort normal and breath sounds normal. No respiratory  distress.  Abdominal: Soft. He exhibits no distension. There is no tenderness.  Musculoskeletal: Normal range of motion.  Full ROM of bilateral upper and lower extremity major joints. No masses, areas or swelling or skin changes noted on his back  Neurological: He is alert.  Moves all 4 extremities equally  Skin: Skin is warm and dry.  Psychiatric: He has a normal mood and affect. Judgment normal.  Nursing note and vitals reviewed.    ED Treatments / Results  Labs (all labs ordered are listed, but only abnormal results are displayed) Labs Reviewed  CBC - Abnormal; Notable for the following:       Result Value   RBC 3.64 (*)    Hemoglobin 12.9 (*)    MCV 108.0 (*)    MCH 35.4 (*)    All other components within normal limits  COMPREHENSIVE METABOLIC PANEL - Abnormal; Notable for the following:    Potassium 5.2 (*)    CO2 33 (*)    BUN 27 (*)    Albumin 3.2 (*)    ALT 14 (*)    Anion gap 4 (*)    All other components within normal limits  I-STAT CG4 LACTIC ACID, ED - Abnormal; Notable for the following:    Lactic Acid, Venous 2.11 (*)    All other components within normal limits  CULTURE, BLOOD (ROUTINE X 2)  CULTURE, BLOOD (ROUTINE X 2)  URINALYSIS, ROUTINE W REFLEX MICROSCOPIC    EKG  EKG Interpretation None       Radiology Dg Chest 2 View  Result Date: 07/21/2016 CLINICAL DATA:  Lethargy EXAM: CHEST  2 VIEW COMPARISON:  July 11, 2016 FINDINGS: There is no edema or consolidation. Heart is upper normal in size with pulmonary vascularity within normal limits. No adenopathy. Calcification is noted in the medial left shoulder region. There is thoracolumbar levoscoliosis. IMPRESSION: No edema or ARDS consolidation. 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 Electronically Signed   By: Bretta Bang III M.D.   On: 07/21/2016 15:18     Procedures Procedures (including critical care time)  Medications Ordered in ED Medications  sodium chloride 0.9 % bolus  1,000 mL (0 mLs Intravenous Stopped 07/21/16 1647)     Initial Impression / Assessment and Plan / ED Course  I have reviewed the triage vital signs and the nursing notes.  Pertinent labs & imaging results that were available during my care of the patient were reviewed by me and considered in my medical decision making (see chart for details).     No masses or ares of swelling noted on his back or sides or abdomen on physical exam. Generalized weakness workup performed without obvious abnormality. Dc home. pcp follow up. Caregiver encouraged to return the pt to the ER for new or worsening symptoms  Final Clinical Impressions(s) / ED Diagnoses   Final diagnoses:  Weakness    New Prescriptions Discharge Medication List as of 07/21/2016  5:38 PM       Azalia Bilis, MD 07/23/16 2200

## 2016-07-26 LAB — CULTURE, BLOOD (ROUTINE X 2): CULTURE: NO GROWTH

## 2016-08-23 ENCOUNTER — Inpatient Hospital Stay (HOSPITAL_COMMUNITY): Admit: 2016-08-23 | Payer: MEDICARE

## 2016-08-23 ENCOUNTER — Emergency Department (HOSPITAL_COMMUNITY): Payer: MEDICARE

## 2016-08-23 ENCOUNTER — Inpatient Hospital Stay (HOSPITAL_COMMUNITY)
Admission: EM | Admit: 2016-08-23 | Discharge: 2016-08-25 | DRG: 064 | Disposition: A | Discharge status: Home / Self Care | Payer: MEDICARE | Attending: Internal Medicine | Admitting: Internal Medicine

## 2016-08-23 ENCOUNTER — Encounter (HOSPITAL_COMMUNITY): Payer: Self-pay | Admitting: Radiology

## 2016-08-23 DIAGNOSIS — Z79899 Other long term (current) drug therapy: Secondary | ICD-10-CM

## 2016-08-23 DIAGNOSIS — G319 Degenerative disease of nervous system, unspecified: Secondary | ICD-10-CM | POA: Diagnosis present

## 2016-08-23 DIAGNOSIS — R29898 Other symptoms and signs involving the musculoskeletal system: Secondary | ICD-10-CM | POA: Diagnosis present

## 2016-08-23 DIAGNOSIS — I10 Essential (primary) hypertension: Secondary | ICD-10-CM | POA: Diagnosis present

## 2016-08-23 DIAGNOSIS — Z881 Allergy status to other antibiotic agents status: Secondary | ICD-10-CM

## 2016-08-23 DIAGNOSIS — Z880 Allergy status to penicillin: Secondary | ICD-10-CM

## 2016-08-23 DIAGNOSIS — M810 Age-related osteoporosis without current pathological fracture: Secondary | ICD-10-CM | POA: Diagnosis present

## 2016-08-23 DIAGNOSIS — I639 Cerebral infarction, unspecified: Secondary | ICD-10-CM | POA: Diagnosis not present

## 2016-08-23 DIAGNOSIS — Z781 Physical restraint status: Secondary | ICD-10-CM

## 2016-08-23 DIAGNOSIS — Z7983 Long term (current) use of bisphosphonates: Secondary | ICD-10-CM

## 2016-08-23 DIAGNOSIS — G9340 Encephalopathy, unspecified: Secondary | ICD-10-CM | POA: Diagnosis present

## 2016-08-23 DIAGNOSIS — Q909 Down syndrome, unspecified: Secondary | ICD-10-CM

## 2016-08-23 DIAGNOSIS — F79 Unspecified intellectual disabilities: Secondary | ICD-10-CM | POA: Diagnosis present

## 2016-08-23 DIAGNOSIS — E039 Hypothyroidism, unspecified: Secondary | ICD-10-CM | POA: Diagnosis not present

## 2016-08-23 DIAGNOSIS — G8191 Hemiplegia, unspecified affecting right dominant side: Secondary | ICD-10-CM | POA: Diagnosis present

## 2016-08-23 DIAGNOSIS — Z888 Allergy status to other drugs, medicaments and biological substances status: Secondary | ICD-10-CM

## 2016-08-23 DIAGNOSIS — K219 Gastro-esophageal reflux disease without esophagitis: Secondary | ICD-10-CM | POA: Diagnosis present

## 2016-08-23 LAB — COMPREHENSIVE METABOLIC PANEL
ALT: 15 U/L — AB (ref 17–63)
AST: 25 U/L (ref 15–41)
Albumin: 3.9 g/dL (ref 3.5–5.0)
Alkaline Phosphatase: 88 U/L (ref 38–126)
Anion gap: 8 (ref 5–15)
BILIRUBIN TOTAL: 0.7 mg/dL (ref 0.3–1.2)
BUN: 20 mg/dL (ref 6–20)
CO2: 29 mmol/L (ref 22–32)
CREATININE: 0.96 mg/dL (ref 0.61–1.24)
Calcium: 8.7 mg/dL — ABNORMAL LOW (ref 8.9–10.3)
Chloride: 104 mmol/L (ref 101–111)
GFR calc Af Amer: >60 mL/min (ref >60)
Glucose, Bld: 94 mg/dL (ref 65–99)
Potassium: 4.1 mmol/L (ref 3.5–5.1)
Sodium: 141 mmol/L (ref 135–145)
TOTAL PROTEIN: 7.3 g/dL (ref 6.5–8.1)

## 2016-08-23 LAB — CBC WITH DIFFERENTIAL/PLATELET
Basophils Absolute: 0.1 10*3/uL (ref 0.0–0.1)
Basophils Relative: 1 %
Eosinophils Absolute: 0 10*3/uL (ref 0.0–0.7)
Eosinophils Relative: 0 %
HCT: 43.6 % (ref 39.0–52.0)
HEMOGLOBIN: 14.6 g/dL (ref 13.0–17.0)
LYMPHS ABS: 0.6 10*3/uL — AB (ref 0.7–4.0)
LYMPHS PCT: 11 %
MCH: 36 pg — ABNORMAL HIGH (ref 26.0–34.0)
MCHC: 33.5 g/dL (ref 30.0–36.0)
MCV: 107.4 fL — AB (ref 78.0–100.0)
MONO ABS: 0.5 10*3/uL (ref 0.1–1.0)
MONOS PCT: 8 %
NEUTROS ABS: 4.4 10*3/uL (ref 1.7–7.7)
Neutrophils Relative %: 80 %
Platelets: 195 10*3/uL (ref 150–400)
RBC: 4.06 MIL/uL — ABNORMAL LOW (ref 4.22–5.81)
RDW: 13.1 % (ref 11.5–15.5)
WBC: 5.6 10*3/uL (ref 4.0–10.5)

## 2016-08-23 LAB — I-STAT TROPONIN, ED: TROPONIN I, POC: 0 ng/mL (ref 0.00–0.08)

## 2016-08-23 LAB — PROTIME-INR
INR: 1.09
PROTHROMBIN TIME: 14.1 s (ref 11.4–15.2)

## 2016-08-23 LAB — I-STAT CG4 LACTIC ACID, ED: LACTIC ACID, VENOUS: 1.08 mmol/L (ref 0.5–1.9)

## 2016-08-23 LAB — LIPASE, BLOOD: Lipase: 24 U/L (ref 11–51)

## 2016-08-23 MED ORDER — ATORVASTATIN CALCIUM 40 MG PO TABS
40.0000 mg | ORAL_TABLET | Freq: Every day | ORAL | Status: DC
  Administered 2016-08-24: 40 mg via ORAL
  Filled 2016-08-23: qty 1

## 2016-08-23 MED ORDER — LACTULOSE 10 GM/15ML PO SOLN
20.0000 g | Freq: Every day | ORAL | Status: DC
Start: 2016-08-24 — End: 2016-08-25
  Administered 2016-08-24 – 2016-08-25 (×2): 20 g via ORAL
  Filled 2016-08-23 (×2): qty 30

## 2016-08-23 MED ORDER — SODIUM CHLORIDE 0.9% FLUSH
3.0000 mL | Freq: Two times a day (BID) | INTRAVENOUS | Status: DC
  Administered 2016-08-23 – 2016-08-25 (×3): 3 mL via INTRAVENOUS

## 2016-08-23 MED ORDER — ASPIRIN EC 81 MG PO TBEC
81.0000 mg | DELAYED_RELEASE_TABLET | Freq: Every day | ORAL | Status: DC
  Administered 2016-08-24 – 2016-08-25 (×2): 81 mg via ORAL
  Filled 2016-08-23 (×2): qty 1

## 2016-08-23 MED ORDER — PANTOPRAZOLE SODIUM 40 MG PO TBEC
40.0000 mg | DELAYED_RELEASE_TABLET | Freq: Every day | ORAL | Status: DC
Start: 2016-08-24 — End: 2016-08-25
  Administered 2016-08-24 – 2016-08-25 (×2): 40 mg via ORAL
  Filled 2016-08-23 (×2): qty 1

## 2016-08-23 MED ORDER — CARBAMAZEPINE 200 MG PO TABS
200.0000 mg | ORAL_TABLET | Freq: Two times a day (BID) | ORAL | Status: DC
  Administered 2016-08-24 – 2016-08-25 (×3): 200 mg via ORAL
  Filled 2016-08-23 (×7): qty 1

## 2016-08-23 MED ORDER — ENALAPRIL MALEATE 10 MG PO TABS
10.0000 mg | ORAL_TABLET | Freq: Every day | ORAL | Status: DC
  Administered 2016-08-24 – 2016-08-25 (×2): 10 mg via ORAL
  Filled 2016-08-23 (×2): qty 1

## 2016-08-23 MED ORDER — STROKE: EARLY STAGES OF RECOVERY BOOK
Freq: Once | Status: DC
  Filled 2016-08-23: qty 1

## 2016-08-23 MED ORDER — LEVOTHYROXINE SODIUM 50 MCG PO TABS
50.0000 ug | ORAL_TABLET | Freq: Every day | ORAL | Status: DC
  Administered 2016-08-25: 50 ug via ORAL
  Filled 2016-08-23: qty 1

## 2016-08-23 MED ORDER — ENOXAPARIN SODIUM 40 MG/0.4ML ~~LOC~~ SOLN
40.0000 mg | SUBCUTANEOUS | Status: DC
  Administered 2016-08-23 – 2016-08-24 (×2): 40 mg via SUBCUTANEOUS
  Filled 2016-08-23 (×2): qty 0.4

## 2016-08-23 MED ORDER — NABUMETONE 500 MG PO TABS
500.0000 mg | ORAL_TABLET | Freq: Every day | ORAL | Status: DC
  Administered 2016-08-24 – 2016-08-25 (×2): 500 mg via ORAL
  Filled 2016-08-23 (×2): qty 1

## 2016-08-23 NOTE — Progress Notes (Signed)
Patient is non verbal, follows command by imitating staff. Unable to complete stroke swallow test. SLP ordered. Will continue monitor

## 2016-08-23 NOTE — ED Triage Notes (Signed)
Per EMS, pt is from a group home. Staff reports that pt has been unable to ambulate for the last 24 hours. Pt has also been showing signs of abdominal pain. Pt is completely nonverbal and this is pt baseline per staff.

## 2016-08-23 NOTE — Progress Notes (Signed)
Patient has arrived to floor. MD-Neurologist at bedside. Patient is non-verbal. No sign of pain or discomfort observed. Will continue to monitor.

## 2016-08-23 NOTE — ED Notes (Signed)
Nurse attempting blood draw at this time.

## 2016-08-23 NOTE — Clinical Social Work Note (Signed)
Clinical Social Work Assessment  Patient Details  Name: Travis Hanna MRN: [id number] Date of Birth: 12/27/1955  Date of referral:  08/23/16               Reason for consult:  Discharge Planning                Permission sought to share information with:  Oceanographer granted to share information::  Yes, Verbal Permission Granted  Name::        Agency::     Relationship::     Contact Information:     Housing/Transportation Living arrangements for the past 2 months:  Group Home ([facility] ) Source of Information:  Other (Comment Required) (Patients care giver) Patient Interpreter Needed:  Other (Comment Required) (Patient is non verbal- caregiver in room) Criminal Activity/Legal Involvement Pertinent to Current Situation/Hospitalization:  No - Comment as needed Significant Relationships:  None Lives with:  Facility Resident Do you feel safe going back to the place where you live?  Yes Need for family participation in patient care:     Care giving concerns:  No concerns addressed by caregiver.    Social Worker assessment / plan:  Patient currently lives at [facility] located at [address], [place] [address]. Patient has been a current resident there for "a long time". Caregiver stated she has been working with patient for 4 years and he has been living there longer. [facility] office number is [phone] and [facility]'s group homes number is [phone]. Representative from group home will be able to pick patient up once medically stable for discharge.   Employment status:  Disabled (Comment on whether or not currently receiving Disability) Insurance information:  Medicare PT Recommendations:  Not assessed at this time Information / Referral to community resources:     Patient/Family's Response to care:  Caregiver thanked CSW.   Patient/Family's Understanding of and Emotional Response to Diagnosis,  Current Treatment, and Prognosis:  Unknown at this time.   Emotional Assessment Appearance:  Appears stated age Attitude/Demeanor/Rapport:    Affect (typically observed):  Calm (Patient is nonverbal unable) Orientation:    Alcohol / Substance use:    Psych involvement (Current and /or in the community):  No (Comment)  Discharge Needs  Concerns to be addressed:  No discharge needs identified Readmission within the last 30 days:  No Current discharge risk:  None Barriers to Discharge:  No Barriers Identified   Donnie Coffin, LCSW 08/23/2016, 2:40 PM

## 2016-08-23 NOTE — ED Provider Notes (Signed)
[facility]-EMERGENCY DEPT Provider Note   CSN: [id number] Arrival date & time: 08/23/16  0856     History   Chief Complaint Chief Complaint  Patient presents with  . Abdominal Pain    HPI Travis Hanna is a 61 y.o. male.  HPI   Patient is a 61 year old male with past medical history significant for Down syndrome. He is presenting today with trouble moving his right leg. It is very hard to get a story from patient, he is nonverbal. Patient has both MR and Down syndrome. He is unable to express pain. Yesterday he was noted that he was having trouble getting up and moving around. They noticed weakness to his right leg versus pain in his abdomen. Patient's group home sitters here with him today and felt like he was draggngt right leg which was indicative of abdominal pain. Patient had recent treatment with Augmentin for pneumonia which ended 1 week ago.  Patient has been eating and drinking normally.  Past Medical History:  Diagnosis Date  . Anemia   . Down syndrome   . MR (mental retardation)   . Myopia with astigmatism   . Osteoporosis     Patient Active Problem List   Diagnosis Date Noted  . Right leg weakness 08/23/2016  . Hypothyroidism 06/26/2013  . Ileus (HCC) 06/25/2013  . Abdominal pain, unspecified site 06/25/2013  . Down syndrome 06/25/2013  . Constipation 06/25/2013    History reviewed. No pertinent surgical history.     Home Medications    Prior to Admission medications   Medication Sig Start Date End Date Taking? Authorizing Provider  alendronate (FOSAMAX) 70 MG tablet Take 70 mg by mouth once a week. Take with a full glass of water on an empty stomach. (on Fridays)    Historical Provider, MD  calcium-vitamin D (OSCAL WITH D) 500-200 MG-UNIT per tablet Take 1 tablet by mouth 2 (two) times daily.     Historical Provider, MD  carBAMazepine (TEGRETOL) 100 MG/5ML suspension Take 200 mg by mouth 2 (two) times daily.     Historical Provider, MD  Emollient  (CERAVE) LOTN Apply 1 application topically daily.    Historical Provider, MD  enalapril (VASOTEC) 10 MG tablet Take 10 mg by mouth daily.    Historical Provider, MD  esomeprazole (NEXIUM) 40 MG capsule Take 40 mg by mouth at bedtime.    Historical Provider, MD  ketoconazole (NIZORAL) 2 % shampoo Apply 1 application topically 3 (three) times a week. Apply to scalp on Monday, Wednesday and Friday    Historical Provider, MD  lactulose (CHRONULAC) 10 GM/15ML solution Take 20 g by mouth daily.     Historical Provider, MD  levothyroxine (SYNTHROID) 50 MCG tablet Take 50 mcg by mouth daily before breakfast.     Historical Provider, MD  mometasone (ELOCON) 0.1 % cream Apply 1 application topically daily.    Historical Provider, MD  nabumetone (RELAFEN) 500 MG tablet Take 500 mg by mouth daily.  02/04/14   Historical Provider, MD  polyethylene glycol (MIRALAX / GLYCOLAX) packet Take 17 g by mouth daily. Mix in 4 oz of liquid and drink    Historical Provider, MD  Polyvinyl Alcohol (LIQUID TEARS OP) Place 1 drop into both eyes 3 (three) times daily.    Historical Provider, MD  Vitamin D, Ergocalciferol, (DRISDOL) 50000 UNITS CAPS capsule Take 50,000 Units by mouth every 7 (seven) days. On Saturdays    Historical Provider, MD    Family History History reviewed. No pertinent family  history.  Social History Social History  Substance Use Topics  . Smoking status: Never Smoker  . Smokeless tobacco: Never Used  . Alcohol use No     Allergies   Carbapenems; Cephalosporins; Other; Penicillamine; and Penicillins   Review of Systems Review of Systems  Constitutional: Negative for activity change.  Respiratory: Negative for shortness of breath.   Cardiovascular: Negative for chest pain.  Gastrointestinal: Negative for abdominal pain.     Physical Exam Updated Vital Signs BP 128/79 (BP Location: Right Arm)   Pulse (!) 57   Temp 97.7 F (36.5 C) (Oral)   Resp 18   Ht  (1.549 m)   Wt 124 lb  1.6 oz (56.3 kg)   SpO2 97%   BMI 23.45 kg/m   Physical Exam  Constitutional: He appears well-nourished.  HENT:  Head: Normocephalic.  Eyes: Conjunctivae are normal.  Abnormal eyes at baseline.  Cardiovascular: Normal rate and regular rhythm.   No murmur heard. Pulmonary/Chest: Effort normal. No respiratory distress.  Abdominal: He exhibits no distension. There is no tenderness.  No grimace on palpitation of abdomen.  Musculoskeletal:  No grimace on palpitation of leg.  Neurological: A cranial nerve deficit is present. Coordination abnormal.  Patient is driving the right leg. Difficulty to tell whether it is from pain versus weakness.  There is questionable weakness in the right arm.  Its difficult to get a good exam with this nonverbal patient that does not follow commands.  Skin: Skin is warm and dry. He is not diaphoretic.  Psychiatric: He has a normal mood and affect. His behavior is normal.     ED Treatments / Results  Labs (all labs ordered are listed, but only abnormal results are displayed) Labs Reviewed  COMPREHENSIVE METABOLIC PANEL - Abnormal; Notable for the following:       Result Value   Calcium 8.7 (*)    ALT 15 (*)    All other components within normal limits  CBC WITH DIFFERENTIAL/PLATELET - Abnormal; Notable for the following:    RBC 4.06 (*)    MCV 107.4 (*)    MCH 36.0 (*)    Lymphs Abs 0.6 (*)    All other components within normal limits  LIPASE, BLOOD  PROTIME-INR  HIV ANTIBODY (ROUTINE TESTING)  CBC  CREATININE, SERUM  HEMOGLOBIN A1C  LIPID PANEL  VITAMIN B12  I-STAT CG4 LACTIC ACID, ED  I-STAT TROPOININ, ED    EKG  EKG Interpretation  Date/Time:  Wednesday August 23 2016 09:38:35 EDT Ventricular Rate:  60 PR Interval:    QRS Duration: 102 QT Interval:  411 QTC Calculation: 411 R Axis:   76 Text Interpretation:  Sinus rhythm Minimal ST elevation, anterior leads Normal sinus rhythm Confirmed by Kandis Mannan ([id number]) on  08/23/2016 10:25:31 AM       Radiology Dg Chest 2 View  Result Date: 08/23/2016 CLINICAL DATA:  61 year old male with Down syndrome. Nonverbal. Increased weakness, limping on the right leg, unable to walk since yesterday. EXAM: CHEST  2 VIEW COMPARISON:  07/21/2016 and earlier. FINDINGS: Semi upright AP and lateral views of the chest. More rotated to the right on the AP view. Skin fold artifact over the left chest. Stable lung volumes. Stable mediastinal contours, mild tortuosity of the thoracic aorta. No pneumothorax, pulmonary edema, pleural effusion or confluent pulmonary opacity. Levoconvex thoracolumbar scoliosis and kyphosis. No acute osseous abnormality identified. Negative visible bowel gas pattern. IMPRESSION: No acute cardiopulmonary abnormality. Electronically Signed   By: Rexene Edison  Margo Aye M.D.   On: 08/23/2016 10:02   Dg Pelvis 1-2 Views  Result Date: 08/23/2016 CLINICAL DATA:  61 year old male with Down syndrome. Nonverbal. Increased weakness, limping on the right leg, unable to walk since yesterday. EXAM: PELVIS - 1-2 VIEW COMPARISON:  CT Abdomen and Pelvis 06/25/2013 FINDINGS: Bilateral femoral heads are normally located. Hip joint spaces are stable and within normal limits. Both proximal femurs appear grossly intact. No pelvis fracture identified. Sacral ala and SI joints appear stable and within normal limits. Partially visible lumbar levoconvex scoliosis. Negative visible bowel gas pattern. IMPRESSION: No acute osseous abnormality identified about the pelvis. If there is lateralizing hip pain then a dedicated hip series would be recommended. Electronically Signed   By: Odessa Fleming M.D.   On: 08/23/2016 10:03   Ct Head Wo Contrast  Result Date: 08/23/2016 CLINICAL DATA:  Right leg weakness and limping. Unable to emboli from last 24 hours EXAM: CT HEAD WITHOUT CONTRAST TECHNIQUE: Contiguous axial images were obtained from the base of the skull through the vertex without intravenous contrast.  COMPARISON:  None. FINDINGS: Brain: Chronic left thalamus lacunar infarct noted. Low attenuation throughout the subcortical and periventricular white matter identified compatible with chronic small vessel ischemic disease. Prominence of the sulci and ventricles noted. Vascular: No hyperdense vessel or unexpected calcification. Skull: Normal. Negative for fracture or focal lesion. Sinuses/Orbits: No acute finding. Other: None. IMPRESSION: 1. No acute intracranial abnormalities. 2. 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 Electronically Signed   By: Signa Kell M.D.   On: 08/23/2016 10:12   Dg Knee Complete 4 Views Right  Result Date: 08/23/2016 CLINICAL DATA:  61 year old male with Down syndrome. Nonverbal. Increased weakness, limping on the right leg, unable to walk since yesterday. EXAM: RIGHT KNEE - COMPLETE 4+ VIEW COMPARISON:  None. FINDINGS: No joint effusion on cross-table lateral view. Mild tricompartmental degenerative spurring, and spurring of the tibial intercondylar eminence. Joint spaces are within normal limits for age. No acute osseous abnormality identified. IMPRESSION: Mild tricompartmental degenerative changes. No acute osseous abnormality at the right knee. Electronically Signed   By: Odessa Fleming M.D.   On: 08/23/2016 10:04    Procedures Procedures (including critical care time)  Medications Ordered in ED Medications  enoxaparin (LOVENOX) injection 40 mg (40 mg Subcutaneous Given 08/23/16 2103)  sodium chloride flush (NS) 0.9 % injection 3 mL (3 mLs Intravenous Given 08/23/16 2103)   stroke: mapping our early stages of recovery book (not administered)  carbamazepine (TEGRETOL) tablet 200 mg (200 mg Oral Not Given 08/23/16 2200)  pantoprazole (PROTONIX) EC tablet 40 mg (not administered)  lactulose (CHRONULAC) 10 GM/15ML solution 20 g (not administered)  levothyroxine (SYNTHROID, LEVOTHROID) tablet 50 mcg (not administered)  nabumetone (RELAFEN) tablet 500 mg (not  administered)  enalapril (VASOTEC) tablet 10 mg (not administered)  aspirin EC tablet 81 mg (not administered)  atorvastatin (LIPITOR) tablet 40 mg (not administered)     Initial Impression / Assessment and Plan / ED Course  I have reviewed the triage vital signs and the nursing notes.  Pertinent labs & imaging results that were available during my care of the patient were reviewed by me and considered in my medical decision making (see chart for details).     Patient is a 61 year old male with MR and Down syndrome presenting with dragging his right leg. Story is very unclear. Patient's here with sitter who believes that it might be abdominal pain. However when I got patient up and a bili that  he's definitely dragging the right leg. It's unclear whether this is secondary to weakness or pain. We will do an initial set of x-rays, labs. And CT head.  9:05 AM Patient's initial workup negative. CT negative. Labs are reassuring. I reexamined patient and he  has persistent right lower extremity weakness. (seen only during ambulation, otherwise too difficult to examen) There is actually no pain palpable on exam. I discussed with neurology. He will require further workup regardless. Therefore we'll admit for right lower extremely weakness with consult by neurology and possibly  get MRI as an inpatient.  Final Clinical Impressions(s) / ED Diagnoses   Final diagnoses:  Acute ischemic stroke [facility])    New Prescriptions Current Discharge Medication List       Rosebud Koenen Randall An, MD 08/24/16 [phone]

## 2016-08-23 NOTE — H&P (Signed)
History and Physical    Travis Hanna GNF:[id number] DOB: 03-20-1956 DOA: 08/23/2016    PCP: Lucretia Field  Patient coming from: group home  Chief Complaint: brought in for stumbling  HPI: Travis Hanna is a 61 y.o. male with medical history of MR, Down's syndrome who is brought by a care taker from a group home. He was noted to start stumbling yesterday. He is usually able to ambulate without issues. The care taker who is with him today states she feels that he is falling over to the right side. He is not able to talk and cannot give a history. When he was walked in the ER, the ER Doctor notes he was dragging his right leg.   ED Course:  Head CT negative. xrays of right leg unrevealing.   Review of Systems:  Unable to obtain.  All other systems reviewed and apart from HPI, are negative.  Past Medical History:  Diagnosis Date  . Anemia   . Down syndrome   . MR (mental retardation)   . Myopia with astigmatism   . Osteoporosis     History reviewed. No pertinent surgical history.  Social History:   reports that he has never smoked. He has never used smokeless tobacco. He reports that he does not drink alcohol or use drugs.  Allergies  Allergen Reactions  . Carbapenems Other (See Comments)    unknown  . Cephalosporins Other (See Comments)    unknown  . Other Other (See Comments)    Allergic to Betalactams per group home  . Penicillamine Other (See Comments)    unknown  . Penicillins Other (See Comments)    unknown    History reviewed. No pertinent family history.   Prior to Admission medications   Medication Sig Start Date End Date Taking? Authorizing Provider  alendronate (FOSAMAX) 70 MG tablet Take 70 mg by mouth once a week. Take with a full glass of water on an empty stomach. (on Fridays)    Historical Provider, MD  calcium-vitamin D (OSCAL WITH D) 500-200 MG-UNIT per tablet Take 1 tablet by mouth 2 (two) times daily.     Historical Provider, MD    carBAMazepine (TEGRETOL) 100 MG/5ML suspension Take 200 mg by mouth 2 (two) times daily.     Historical Provider, MD  Emollient (CERAVE) LOTN Apply 1 application topically daily.    Historical Provider, MD  enalapril (VASOTEC) 10 MG tablet Take 10 mg by mouth daily.    Historical Provider, MD  esomeprazole (NEXIUM) 40 MG capsule Take 40 mg by mouth at bedtime.    Historical Provider, MD  ketoconazole (NIZORAL) 2 % shampoo Apply 1 application topically 3 (three) times a week. Apply to scalp on Monday, Wednesday and Friday    Historical Provider, MD  lactulose (CHRONULAC) 10 GM/15ML solution Take 20 g by mouth daily.     Historical Provider, MD  levothyroxine (SYNTHROID) 50 MCG tablet Take 50 mcg by mouth daily before breakfast.     Historical Provider, MD  mometasone (ELOCON) 0.1 % cream Apply 1 application topically daily.    Historical Provider, MD  nabumetone (RELAFEN) 500 MG tablet Take 500 mg by mouth daily.  02/04/14   Historical Provider, MD  polyethylene glycol (MIRALAX / GLYCOLAX) packet Take 17 g by mouth daily. Mix in 4 oz of liquid and drink    Historical Provider, MD  Polyvinyl Alcohol (LIQUID TEARS OP) Place 1 drop into both eyes 3 (three) times daily.    Historical  Provider, MD  Vitamin D, Ergocalciferol, (DRISDOL) 50000 UNITS CAPS capsule Take 50,000 Units by mouth every 7 (seven) days. On Saturdays    Historical Provider, MD    Physical Exam: Vitals:   08/23/16 0858 08/23/16 0908 08/23/16 1333  BP:  120/80 103/75  Pulse:  (!) 58 (!) 55  Resp:  18 13  Temp:   97.3 F (36.3 C)  TempSrc:   Oral  SpO2: 98% 98% 96%      Constitutional: NAD, calm, comfortable- he can follow some commands, non-verbal Eyes: PERTLA, lids and conjunctivae normal ENMT: Mucous membranes are moist. Posterior pharynx clear of any exudate or lesions. Normal dentition.  Neck: normal, supple, no masses, no thyromegaly Respiratory: clear to auscultation bilaterally, no wheezing, no crackles. Normal  respiratory effort. No accessory muscle use.  Cardiovascular: S1 & S2 heard, regular rate and rhythm, no murmurs / rubs / gallops. No extremity edema. 2+ pedal pulses. No carotid bruits.  Abdomen: No distension, no tenderness, no masses palpated. No hepatosplenomegaly. Bowel sounds normal.  Musculoskeletal: no clubbing / cyanosis. No joint deformity upper and lower extremities. Good ROM, no contractures. Normal muscle tone.  Skin: no rashes, lesions, ulcers. No induration Neurologic: CN 2-12 grossly intact. Unable to assess sensation. Able to move all 4 limbs. I cannot assess strength as he does not follow commands to assist with this. Psychiatric: calm- otherwise difficult to assess    Labs on Admission: I have personally reviewed following labs and imaging studies  CBC:  Recent Labs Lab 08/23/16 1011  WBC 5.6  NEUTROABS 4.4  HGB 14.6  HCT 43.6  MCV 107.4*  PLT 195   Basic Metabolic Panel:  Recent Labs Lab 08/23/16 1011  NA 141  K 4.1  CL 104  CO2 29  GLUCOSE 94  BUN 20  CREATININE 0.96  CALCIUM 8.7*   GFR: CrCl cannot be calculated (Unknown ideal weight.). Liver Function Tests:  Recent Labs Lab 08/23/16 1011  AST 25  ALT 15*  ALKPHOS 88  BILITOT 0.7  PROT 7.3  ALBUMIN 3.9    Recent Labs Lab 08/23/16 1011  LIPASE 24   No results for input(s): AMMONIA in the last 168 hours. Coagulation Profile:  Recent Labs Lab 08/23/16 1011  INR 1.09   Cardiac Enzymes: No results for input(s): CKTOTAL, CKMB, CKMBINDEX, TROPONINI in the last 168 hours. BNP (last 3 results) No results for input(s): PROBNP in the last 8760 hours. HbA1C: No results for input(s): HGBA1C in the last 72 hours. CBG: No results for input(s): GLUCAP in the last 168 hours. Lipid Profile: No results for input(s): CHOL, HDL, LDLCALC, TRIG, CHOLHDL, LDLDIRECT in the last 72 hours. Thyroid Function Tests: No results for input(s): TSH, T4TOTAL, FREET4, T3FREE, THYROIDAB in the last 72  hours. Anemia Panel: No results for input(s): VITAMINB12, FOLATE, FERRITIN, TIBC, IRON, RETICCTPCT in the last 72 hours. Urine analysis:    Component Value Date/Time   COLORURINE YELLOW 07/21/2016 1647   APPEARANCEUR CLEAR 07/21/2016 1647   LABSPEC 1.011 07/21/2016 1647   PHURINE 7.0 07/21/2016 1647   GLUCOSEU NEGATIVE 07/21/2016 1647   HGBUR NEGATIVE 07/21/2016 1647   BILIRUBINUR NEGATIVE 07/21/2016 1647   KETONESUR NEGATIVE 07/21/2016 1647   PROTEINUR NEGATIVE 07/21/2016 1647   UROBILINOGEN 0.2 06/25/2013 2011   NITRITE NEGATIVE 07/21/2016 1647   LEUKOCYTESUR NEGATIVE 07/21/2016 1647   Sepsis Labs: (procalcitonin:4,lacticidven:4) )No results found for this or any previous visit (from the past 240 hour(s)).   Radiological Exams on Admission: Dg Chest 2  View  Result Date: 08/23/2016 CLINICAL DATA:  61 year old male with Down syndrome. Nonverbal. Increased weakness, limping on the right leg, unable to walk since yesterday. EXAM: CHEST  2 VIEW COMPARISON:  07/21/2016 and earlier. FINDINGS: Semi upright AP and lateral views of the chest. More rotated to the right on the AP view. Skin fold artifact over the left chest. Stable lung volumes. Stable mediastinal contours, mild tortuosity of the thoracic aorta. No pneumothorax, pulmonary edema, pleural effusion or confluent pulmonary opacity. Levoconvex thoracolumbar scoliosis and kyphosis. No acute osseous abnormality identified. Negative visible bowel gas pattern. IMPRESSION: No acute cardiopulmonary abnormality. Electronically Signed   By: Odessa Fleming M.D.   On: 08/23/2016 10:02   Dg Pelvis 1-2 Views  Result Date: 08/23/2016 CLINICAL DATA:  61 year old male with Down syndrome. Nonverbal. Increased weakness, limping on the right leg, unable to walk since yesterday. EXAM: PELVIS - 1-2 VIEW COMPARISON:  CT Abdomen and Pelvis 06/25/2013 FINDINGS: Bilateral femoral heads are normally located. Hip joint spaces are stable and within normal  limits. Both proximal femurs appear grossly intact. No pelvis fracture identified. Sacral ala and SI joints appear stable and within normal limits. Partially visible lumbar levoconvex scoliosis. Negative visible bowel gas pattern. IMPRESSION: No acute osseous abnormality identified about the pelvis. If there is lateralizing hip pain then a dedicated hip series would be recommended. Electronically Signed   By: Odessa Fleming M.D.   On: 08/23/2016 10:03   Ct Head Wo Contrast  Result Date: 08/23/2016 CLINICAL DATA:  Right leg weakness and limping. Unable to emboli from last 24 hours EXAM: CT HEAD WITHOUT CONTRAST TECHNIQUE: Contiguous axial images were obtained from the base of the skull through the vertex without intravenous contrast. COMPARISON:  None. FINDINGS: Brain: Chronic left thalamus lacunar infarct noted. Low attenuation throughout the subcortical and periventricular white matter identified compatible with chronic small vessel ischemic disease. Prominence of the sulci and ventricles noted. Vascular: No hyperdense vessel or unexpected calcification. Skull: Normal. Negative for fracture or focal lesion. Sinuses/Orbits: No acute finding. Other: None. IMPRESSION: 1. No acute intracranial abnormalities. 2. 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 Electronically Signed   By: Signa Kell M.D.   On: 08/23/2016 10:12   Dg Knee Complete 4 Views Right  Result Date: 08/23/2016 CLINICAL DATA:  61 year old male with Down syndrome. Nonverbal. Increased weakness, limping on the right leg, unable to walk since yesterday. EXAM: RIGHT KNEE - COMPLETE 4+ VIEW COMPARISON:  None. FINDINGS: No joint effusion on cross-table lateral view. Mild tricompartmental degenerative spurring, and spurring of the tibial intercondylar eminence. Joint spaces are within normal limits for age. No acute osseous abnormality identified. IMPRESSION: Mild tricompartmental degenerative changes. No acute osseous abnormality at the  right knee. Electronically Signed   By: Odessa Fleming M.D.   On: 08/23/2016 10:04    EKG: Independently reviewed. Sinus rhythm in 60s  Assessment/Plan Principal Problem:   Right leg weakness - usually does not need any assistive devices to ambulate - CT head and xrays negative - no tenderness in leg on exam - Dr Roxy Manns, Neuro would like her admitted to [facility] - obtain MRI, 2 D ECHO and carotid duplex - neuro checks  Active Problems:  HTN? - hold Vasotec for now until certain that he has not had and acute CVA    Hypothyroidism -cont synthroid  GERD - cont PPI  Down syndrome MR - able to feed himself, understand some commands, has control of bowel and bladder but has to be  encouraged to go to the bathroom   Not sure why he is on Tegretaol but will continue   DVT prophylaxis: Lovenox Code Status: Full code  Family Communication:   Disposition Plan: telemetry  Consults called: Neuro, Dr Roxy Manns called by ER- requested admit directly to [facility]  Admission status: observation    Calvert Cantor MD [facility] Pager: [email or web address] Password [email or web address] 7PM-7AM, please contact night-coverage   08/23/2016, 2:05 PM

## 2016-08-23 NOTE — Consult Note (Signed)
Neurology Consult Note  Reason for Consultation: R leg weakness  Requesting provider: Calvert Cantor, MD  CC: Unable to obtain as the patient is nonverbal  HPI: This is a 23-yo male with h/o MR and Down's who was brought to the ED by his caregiver for evaluation of difficulty walking. History is obtained from his caregiver who is present at the bedside. I have also reviewed available medical records. The patient is nonverbal and thus unable to provide any history.   His caregiver reports that she last saw him normal at 1500 on 08/22/16. She states that at 1700, one to her getting ready for dinner, she noticed that he was dragging his right leg when he was trying to walk. As he was sitting at the table trying to eat, she also noticed that he was not moving his right arm normally and instead of bringing his hand up to his mouth to eat he was bringing his head down to his hand which was near his plate. When symptoms persisted today, he was brought to the emergency department at [facility] for further evaluation. CT scan of the head was obtained and reportedly showed no acute abnormality. Due to concern for possible stroke, the patient was transferred to [facility] for neurologic assessment.  PMH:  Past Medical History:  Diagnosis Date  . Anemia   . Down syndrome   . MR (mental retardation)   . Myopia with astigmatism   . Osteoporosis     PSH: Limited to chart review.  History reviewed. No pertinent surgical history.  Family history: Limited to chart review. History reviewed. No pertinent family history.  Social history: Limited to chart review. Social History   Social History  . Marital status: Single    Spouse name: N/A  . Number of children: N/A  . Years of education: N/A   Occupational History  . Not on file.   Social History Main Topics  . Smoking status: Never Smoker  . Smokeless tobacco: Never Used  . Alcohol use No  . Drug use: No  . Sexual activity: No    Other Topics Concern  . Not on file   Social History Narrative  . No narrative on file     Current inpatient meds: Medications reviewed and reconciled.  Current Facility-Administered Medications  Medication Dose Route Frequency Provider Last Rate Last Dose  .  stroke: mapping our early stages of recovery book   Does not apply Once Calvert Cantor, MD      . carbamazepine (TEGRETOL) tablet 200 mg  200 mg Oral BID Calvert Cantor, MD      . Melene Muller ON 08/24/2016] enalapril (VASOTEC) tablet 10 mg  10 mg Oral Daily Saima Rizwan, MD      . enoxaparin (LOVENOX) injection 40 mg  40 mg Subcutaneous Q24H Calvert Cantor, MD      . Melene Muller ON 08/24/2016] lactulose (CHRONULAC) 10 GM/15ML solution 20 g  20 g Oral Daily Calvert Cantor, MD      . Melene Muller ON 08/24/2016] levothyroxine (SYNTHROID, LEVOTHROID) tablet 50 mcg  50 mcg Oral QAC breakfast Calvert Cantor, MD      . Melene Muller ON 08/24/2016] nabumetone (RELAFEN) tablet 500 mg  500 mg Oral Q breakfast Calvert Cantor, MD      . Melene Muller ON 08/24/2016] pantoprazole (PROTONIX) EC tablet 40 mg  40 mg Oral Daily Saima Rizwan, MD      . sodium chloride flush (NS) 0.9 % injection 3 mL  3 mL Intravenous Q12H  Calvert Cantor, MD        Allergies: Allergies  Allergen Reactions  . Carbapenems Other (See Comments)    unknown  . Cephalosporins Other (See Comments)    unknown  . Other Other (See Comments)    Allergic to Betalactams per group home  . Penicillamine Other (See Comments)    unknown  . Penicillins Other (See Comments)    unknown    ROS: As per HPI. A full 14-point review of systems could not be obtained as the patient is nonverbal and unable to participate.  PE:  BP (!) 142/78 (BP Location: Right Arm)   Pulse 67   Temp 99.5 F (37.5 C) (Oral)   Resp 20   Ht  (1.549 m)   Wt 56.3 kg (124 lb 1.6 oz)   SpO2 100%   BMI 23.45 kg/m   General: WD Caucasian male with evident Down's features resting comfortably in bed. He is nonverbal. At times, he will nod  and shake his head to questions. He will mumble and grunt. He does not follow verbal commands but can mimic to a degree. However, even this is inconsistent. This limits his examination significantly. HEENT: Down's facies evident. Neck supple without LAD. MMM, OP appears clear. Sclerae anicteric. No conjunctival injection.  CV: Regular, no obvious murmur. Carotid pulses full and symmetric, no bruits. Distal pulses 2+ and symmetric.  Lungs: CTAB.  Abdomen: Soft, non-distended, non-tender. He has some voluntary guarding with palpation but no obvious tenderness. No rebound. Bowel sounds present x4.  Extremities: No C/C/E. Neuro:  CN: Pupils are equal and round. They are symmetrically reactive from 3-->2 mm. He blinks to visual threat 4. He has a left esotropia. EOMI with breakup of smooth pursuit in all directions but no nystagmus. Corneals appear to be intact and symmetric. His face appears symmetric at rest and he has a symmetric grimace. Shoulder shrug is 5/5. Tongue protrudes midline. The remainder of cranial nerve examination is limited by cooperation.  Motor: Normal bulk. He has prominent mitgehen paratonia. Underlying tone seems to be normal. He is able to move all 4 extremities spontaneously with at least 4/5 strength. Confrontational strength testing is limited by his inability to comprehend commands. Grips appear strong and equal. He is able to lift both legs off the bed with at least 4+/5 strength but effort is variable. No tremor or other abnormal movements.  Sensation: Limited by inability to participate. He grimaces and withdraws to minimal noxious stimuli 4.  DTRs: 2+, symmetric. Toes downgoing bilaterally. No pathologic reflexes.  Coordination/gait: These are deferred due to difficulty with comprehension of commands.   Labs:  Lab Results  Component Value Date   WBC 5.6 08/23/2016   HGB 14.6 08/23/2016   HCT 43.6 08/23/2016   PLT 195 08/23/2016   GLUCOSE 94 08/23/2016   ALT 15 (L)  08/23/2016   AST 25 08/23/2016   NA 141 08/23/2016   K 4.1 08/23/2016   CL 104 08/23/2016   CREATININE 0.96 08/23/2016   BUN 20 08/23/2016   CO2 29 08/23/2016   TSH 1.481 06/26/2013   INR 1.09 08/23/2016   Lactate 1.08 Troponin 0.00 Lipase 24 CBC notable for an MCV of 107.4  Imaging:  I have personally and independently reviewed CT scan of the head without contrast from today. This shows moderate diffuse generalized atrophy with prominence of both temporal horns. There is a mild degree of calcification in the basal ganglia, right more than left. On image #14 of  his axial sequences, there appears to be a focal area of hypodensity involving the caudate and anterior limb of internal capsule which could represent subacute versus chronic ischemia. He appears to have chronic lacunar infarction of the left thalamus. No obvious acute abnormality is noted.  Assessment and Plan:  1. Acute right hemiparesis: This is by caregiver's report. No obvious focality is appreciated on my examination but this is significantly limited by the patient's difficulty following commands. History as reported is certainly concerning for the possibility of stroke. CT scan of the head didn't show any obvious acute abnormality though there is a focal area of hypodensity involving the left basal ganglia and internal capsule that could represent subacute ischemia. Ideally MRI scan of the brain would be obtained for further evaluation though I'm not sure how well he will tolerate this. I will start by repeating CT scan of the head tomorrow to see if this shows any evidence of evolving infarct. If this is unrevealing, may need to consider MRI scan of the brain. PT/OT/rehabilitation as needed.  2. Cerebrovascular disease: CT scan of the head shows evidence of chronic small vessel ischemic changes and old lacunar infarction in the left thalamus. Known risk factors for stroke in this patient include age and advanced Down syndrome.  Agree with echocardiogram and carotid ultrasound. Hemoglobin A1c and lipid panel are currently pending. I will initiate aspirin 81 mg daily as well as atorvastatin 40 mg daily.  3. Encephalopathy: This appears to be chronic report of any acute changes. At baseline, he is nonverbal. His care giver reports that he seems to be able to understand everything, though that was not my impression on my examination today. This background encephalopathy certainly places him at increased risk for delirium from all causes. Every effort should be made to normalize his sleep-wake cycles, keeping his room bright and active by day and quiet and dark by night. Minimize CNS active medications, specifically benzodiazepines, opiates, and anything with strong anticholinergic properties.  This was discussed with the patient's caregiver. She is in agreement with the plan as noted. She was given the opportunity to ask any questions and these were addressed to her satisfaction.   Thank you for this consultation. Neurology will continue to follow. Please call with any urgent questions or concerns.

## 2016-08-23 NOTE — ED Notes (Signed)
Pt aware of need for urine and has a urinal at bedside.

## 2016-08-24 ENCOUNTER — Observation Stay (HOSPITAL_COMMUNITY): Payer: MEDICARE

## 2016-08-24 DIAGNOSIS — Z880 Allergy status to penicillin: Secondary | ICD-10-CM | POA: Diagnosis not present

## 2016-08-24 DIAGNOSIS — I639 Cerebral infarction, unspecified: Secondary | ICD-10-CM | POA: Diagnosis not present

## 2016-08-24 DIAGNOSIS — G9340 Encephalopathy, unspecified: Secondary | ICD-10-CM | POA: Diagnosis present

## 2016-08-24 DIAGNOSIS — Q909 Down syndrome, unspecified: Secondary | ICD-10-CM | POA: Diagnosis not present

## 2016-08-24 DIAGNOSIS — F79 Unspecified intellectual disabilities: Secondary | ICD-10-CM | POA: Diagnosis present

## 2016-08-24 DIAGNOSIS — Z781 Physical restraint status: Secondary | ICD-10-CM | POA: Diagnosis not present

## 2016-08-24 DIAGNOSIS — I10 Essential (primary) hypertension: Secondary | ICD-10-CM | POA: Diagnosis present

## 2016-08-24 DIAGNOSIS — E039 Hypothyroidism, unspecified: Secondary | ICD-10-CM | POA: Diagnosis not present

## 2016-08-24 DIAGNOSIS — G319 Degenerative disease of nervous system, unspecified: Secondary | ICD-10-CM | POA: Diagnosis present

## 2016-08-24 DIAGNOSIS — M810 Age-related osteoporosis without current pathological fracture: Secondary | ICD-10-CM | POA: Diagnosis present

## 2016-08-24 DIAGNOSIS — Z888 Allergy status to other drugs, medicaments and biological substances status: Secondary | ICD-10-CM | POA: Diagnosis not present

## 2016-08-24 DIAGNOSIS — Z79899 Other long term (current) drug therapy: Secondary | ICD-10-CM | POA: Diagnosis not present

## 2016-08-24 DIAGNOSIS — R29898 Other symptoms and signs involving the musculoskeletal system: Secondary | ICD-10-CM | POA: Diagnosis not present

## 2016-08-24 DIAGNOSIS — K219 Gastro-esophageal reflux disease without esophagitis: Secondary | ICD-10-CM | POA: Diagnosis present

## 2016-08-24 DIAGNOSIS — Z7983 Long term (current) use of bisphosphonates: Secondary | ICD-10-CM | POA: Diagnosis not present

## 2016-08-24 DIAGNOSIS — Z881 Allergy status to other antibiotic agents status: Secondary | ICD-10-CM | POA: Diagnosis not present

## 2016-08-24 DIAGNOSIS — G8191 Hemiplegia, unspecified affecting right dominant side: Secondary | ICD-10-CM | POA: Diagnosis present

## 2016-08-24 LAB — VAS US CAROTID
LCCADDIAS: -10 cm/s
LCCADSYS: -53 cm/s
LCCAPDIAS: 12 cm/s
LCCAPSYS: 69 cm/s
LEFT ECA DIAS: -11 cm/s
LEFT VERTEBRAL DIAS: 8 cm/s
Left ICA prox dias: -8 cm/s
Left ICA prox sys: -17 cm/s
RCCADSYS: -31 cm/s
RCCAPDIAS: 29 cm/s
RIGHT ECA DIAS: -12 cm/s
RIGHT VERTEBRAL DIAS: -24 cm/s
Right CCA prox sys: 79 cm/s

## 2016-08-24 LAB — LIPID PANEL
CHOLESTEROL: 157 mg/dL (ref 0–200)
HDL: 39 mg/dL — AB (ref >40)
LDL CALC: 104 mg/dL — AB (ref 0–99)
TRIGLYCERIDES: 70 mg/dL (ref <150)
Total CHOL/HDL Ratio: 4 RATIO
VLDL: 14 mg/dL (ref 0–40)

## 2016-08-24 LAB — VITAMIN B12: Vitamin B-12: 422 pg/mL (ref 180–914)

## 2016-08-24 LAB — ECHOCARDIOGRAM COMPLETE
Height: 61 in
WEIGHTICAEL: 1985.6 [oz_av]

## 2016-08-24 NOTE — Progress Notes (Signed)
CSW spoke with group home RN concerning pt need for retraints in hospital- RN states that this will not be a barrier for pt return since they have 24 hour supervision  CSW paged MD to update  Burna Sis, LCSW Clinical Social Worker [phone]

## 2016-08-24 NOTE — Progress Notes (Signed)
OT Cancellation Note  Patient Details Name: BERNAL LUHMAN MRN: [id number] DOB: 1955-08-10   Cancelled Treatment:    Reason Eval/Treat Not Completed: Patient at procedure or test/ unavailable.  Will reattempt.  Faizan Geraci Chewton, OTR/L [phone]   Jeani Hawking M 08/24/2016, 8:54 AM

## 2016-08-24 NOTE — Progress Notes (Signed)
Neurology Progress Note  Subjective: He was very agitated overnight, moaning and yelling. He became combative, attempting to hit and bite staff members, spitting at RN, hitting his head on the bed. He attempted to remove telemetry, gown, and IV. He was able to pull out his IV once. Soft limb restraints were placed to both wrists for patient and staff safety. He remains agitated this morning, pulling at restraints, yelling, and trying to sit up in bed. He will calm briefly when I talk to him and try to soothe him but this is short-lived. He is unable to follow commands but did not do so at baseline so this is not really a change this morning. He is nonverbal and cannot participate with ROS.    Medications reviewed and reconciled.   Pertinent meds: Aspirin 81 mg daily Atorvastatin 40 mg daily  Current Meds:   Current Facility-Administered Medications:  .   stroke: mapping our early stages of recovery book, , Does not apply, Once, Calvert Cantor, MD .  aspirin EC tablet 81 mg, 81 mg, Oral, Daily, Versie Starks, MD .  atorvastatin (LIPITOR) tablet 40 mg, 40 mg, Oral, q1800, Versie Starks, MD .  carbamazepine (TEGRETOL) tablet 200 mg, 200 mg, Oral, BID, Calvert Cantor, MD .  enalapril (VASOTEC) tablet 10 mg, 10 mg, Oral, Daily, Saima Rizwan, MD .  enoxaparin (LOVENOX) injection 40 mg, 40 mg, Subcutaneous, Q24H, Calvert Cantor, MD, 40 mg at 08/23/16 2103 .  lactulose (CHRONULAC) 10 GM/15ML solution 20 g, 20 g, Oral, Daily, Saima Rizwan, MD .  levothyroxine (SYNTHROID, LEVOTHROID) tablet 50 mcg, 50 mcg, Oral, QAC breakfast, Saima Rizwan, MD .  nabumetone (RELAFEN) tablet 500 mg, 500 mg, Oral, Q breakfast, Saima Rizwan, MD .  pantoprazole (PROTONIX) EC tablet 40 mg, 40 mg, Oral, Daily, Saima Rizwan, MD .  sodium chloride flush (NS) 0.9 % injection 3 mL, 3 mL, Intravenous, Q12H, Calvert Cantor, MD, 3 mL at 08/23/16 2103  Objective:  Temp:  [97.3 F (36.3 C)-99.5 F (37.5 C)] 97.7 F (36.5 C)  (04/19 0345) Pulse Rate:  [55-67] 57 (04/19 0345) Resp:  [13-20] 18 (04/19 0345) BP: (103-142)/(73-91) 128/79 (04/19 0345) SpO2:  [95 %-100 %] 97 % (04/19 0345) FiO2 (%):  [21 %] 21 % (04/18 1410) Weight:  [56.3 kg (124 lb 1.6 oz)] 56.3 kg (124 lb 1.6 oz) (04/18 1603)  General: WD Caucasian man with Downs facies lying in bed with soft restraints in place both wrists. He is agitated, hollering out and pulling at his restraints, trying to sit up in bed at times. He moans and hollers but not clearly verbalizing. He does not follow commands. He will calm briefly but this does not last.  HEENT: Neck is supple without lymphadenopathy. Mucous membranes appear dry. Sclerae are anicteric. There is no conjunctival injection.  CV: Regular, no murmur. Carotid pulses are 2+ and symmetric with no bruits. Distal pulses 2+ and symmetric.  Lungs: CTAB  Extremities: No C/C/E. Soft restraints in place B wrists.  Neuro: MS: As noted above.  CN: Pupils are equal and reactive from 3-->2 mm bilaterally. He blinks to visual threat. 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 Hypotelorism and Downs facies noted. EOMI with breakup of smooth pursuits. Corneals are intact and symmetric. His face is symmetric with normal mobility. The remainder of his cranial nerve exam is limited as he does not participate.  Motor: Normal bulk. He moves all four extremities and pulls at his restraints with good strength, no  obvious focal weakness though at times there does seem to be relatively less movement of the RLE. No tremor or other abnormal movements are observed.  Sensation: Deferred due to agitation and inability to participate with the exam.   DTRs: 2+, symmetric. Toes are downgoing bilaterally. No pathological reflexes.  Coordination/gait: Deferred as the patient is unable to participate with the exam.   Labs: Lab Results  Component Value Date   WBC 5.6 08/23/2016   HGB 14.6 08/23/2016   HCT 43.6 08/23/2016   PLT 195  08/23/2016   GLUCOSE 94 08/23/2016   ALT 15 (L) 08/23/2016   AST 25 08/23/2016   NA 141 08/23/2016   K 4.1 08/23/2016   CL 104 08/23/2016   CREATININE 0.96 08/23/2016   BUN 20 08/23/2016   CO2 29 08/23/2016   TSH 1.481 06/26/2013   INR 1.09 08/23/2016   CBC Latest Ref Rng & Units 08/23/2016 07/21/2016 06/29/2013  WBC 4.0 - 10.5 K/uL 5.6 4.5 5.2  Hemoglobin 13.0 - 17.0 g/dL 16.1 12.9(L) 14.5  Hematocrit 39.0 - 52.0 % 43.6 39.3 42.3  Platelets 150 - 400 K/uL 195 241 205    No results found for: HGBA1C Lab Results  Component Value Date   ALT 15 (L) 08/23/2016   AST 25 08/23/2016   ALKPHOS 88 08/23/2016   BILITOT 0.7 08/23/2016    Lipid panel pending Vitamin B12 pending Hemoglobin a1c pending  Radiology:  There is no new neuroimaging for review.   Other diagnostic studies:  TTE pending Carotid Dopplers pending  A/P:   1. Possible ischemic stroke: Presentation is concerning for stroke with initial CTH showing a focal hypodensity of uncertain age in the L basal ganglia. He is not going to tolerate and MRI and I do not want to put him under general anesthesia to get one as I do not think the benefit of the test would outweigh the risk of anesthesia. I have ordered a repeat CTH for this morning but I am not sure he will tolerate that either given his level of agitation. TTE and carotid Dopplers are also pending if he will allow the tests. If testing cannot be obtained, will defer and proceed with medical management for presumed stroke. Continue aspirin, statin. Await lipid panel, a1c. Treat BP and gradually lower to goal SBP<120 mmHg as needed.   2. Acute encephalopathy: This represents an acute delirium. He has very poor cerebral reserve that sets him up for delirium from all causes. No significant metabolic derangements have been noted. If he has indeed had a stroke, this could be factoring into his delirium. Treatment is supportive. He may require Haldol IV for extreme agitation for  his own safety and that of the staff. If he will take PO meds, would consider scheduling a low dose of quetiapine or olanzapine. Optimize metabolic status as you are. Will need to work to remove restraints as soon as safely feasible as these are likely exacerbating his agitation.   3. Possible RLE weakness: Exam is very limited. Yesterday he did not clearly have any significant deficits on exam. This morning there is the suggestion that he may not be moving his RLE as much as the LLE but I cannot get a good exam due to his agitation. PT as tolerated.   No family or caregiver present at the time of my visit.  I discussed my impression and recommendations with his attending, Dr. Elvera Lennox, at the time of my visit.   Rhona Leavens, MD [facility]

## 2016-08-24 NOTE — Progress Notes (Addendum)
Patient is combative, trying to hit & bite staff members, spitting at staff members,  yelling, hitting his head on the bed, not following commands, trying to get out of bed trying remove teley and clothes. Trying to removed IV   Patient previously removed IV earlier in the shift  Fall mats placed beside patient's bed.  Bed alarm on.  Rn has notified provider & received order for restraints  RN will continue to monitor patient for safety

## 2016-08-24 NOTE — Progress Notes (Signed)
Neurology Progress Note  CTH, CUS, and TTE have all been completed.   I have personally and independently reviewed the [facility] without contrast from today. This shows a probable acute small vessel stroke in the white matter adjacent to the L basal ganglia which would explain his presenting symptoms of R sided weakness.   Carotid Dopplers showed no hemodynamically significant stenosis.   TTE showed normal LV size and function with EF 60-65%; no evident regional wall motion abnormality; mild-moderate TR; small pericardiac effusion; no cardiac source of embolism.   At this point, continue aspirin and atorvastatin. Continue risk factor modification with tight BP, glucose and lipid control for secondary stroke prevention. No further workup necessary from the neurology perspective. Will sign off for now. Please call if any additional questions or concerns should arise.

## 2016-08-24 NOTE — Evaluation (Signed)
Physical Therapy Evaluation Patient Details Name: Travis Hanna MRN: [id number] DOB: Sep 24, 1955 Today's Date: 08/24/2016   History of Present Illness  61 y.o. male with medical history of MR and Down's syndrome who is brought by a care taker from a group home. He was noted to start stumbling yesterday and dragging his right leg. CT head showed possible developing L periventricular infarct.   Clinical Impression  Pt admitted with above diagnosis. Pt currently with functional limitations due to the deficits listed below (see PT Problem List). Min assist for balance to ambulate 56' with noted decreased step length and weight shift on RLE. Pt pleasant and cooperative, eval somewhat limited by pt's decreased ability to follow commands.  Pt will benefit from skilled PT to increase their independence and safety with mobility to allow discharge to the venue listed below.       Follow Up Recommendations Other (comment);Home health PT;SNF;Supervision for mobility/OOB (min A for mobility, recommend return to group home as this is familiar for pt with HHPT; if group home cannot provide assist for mobility, then ST-SNF)    Equipment Recommendations  Other (comment) (TBD -will assess gait with RW next visit)    Recommendations for Other Services OT consult     Precautions / Restrictions Precautions Precautions: Fall Restrictions Weight Bearing Restrictions: No      Mobility  Bed Mobility               General bed mobility comments: up in recliner  Transfers Overall transfer level: Needs assistance Equipment used: 1 person hand held assist Transfers: Sit to/from Stand Sit to Stand: Min guard         General transfer comment: min/guard for safety, no LOB  Ambulation/Gait Ambulation/Gait assistance: Min assist Ambulation Distance (Feet): 60 Feet Assistive device: 1 person hand held assist Gait Pattern/deviations: Step-to pattern;Decreased step length - right;Decreased weight  shift to right   Gait velocity interpretation: Below normal speed for age/gender General Gait Details: step-to pattern with RLE, unclear what baseline gait pattern was, min A for balance as pt tends to lean R  Stairs            Wheelchair Mobility    Modified Rankin (Stroke Patients Only)       Balance Overall balance assessment: Needs assistance   Sitting balance-Leahy Scale: Good       Standing balance-Leahy Scale: Fair                               Pertinent Vitals/Pain Faces Pain Scale: No hurt    Home Living Family/patient expects to be discharged to:: Group home                 Additional Comments: Group home if they can provide min assist with mobility    Prior Function Level of Independence: Independent         Comments: per chart pt ambulated independently prior to admission     Hand Dominance        Extremity/Trunk Assessment   Upper Extremity Assessment Upper Extremity Assessment: Defer to OT evaluation    Lower Extremity Assessment Lower Extremity Assessment: Difficult to assess due to impaired cognition (pt able to actively extend B knees, but couldn't follow directions when asked to provide resistance during manual muscle testing)    Cervical / Trunk Assessment Cervical / Trunk Assessment: Kyphotic  Communication   Communication: Receptive difficulties;Expressive difficulties (able to follow  simple commands, minimal verbalization, responded inconsistently to yes/no questions, no CG present to determine baseline)  Cognition Arousal/Alertness: Awake/alert Behavior During Therapy: WFL for tasks assessed/performed Overall Cognitive Status: No family/caregiver present to determine baseline cognitive functioning (pt has mental retardation at baseline, able to follow simple commands inconsistently during PT eval)                                        General Comments      Exercises      Assessment/Plan    PT Assessment Patient needs continued PT services  PT Problem List Decreased activity tolerance;Decreased balance;Decreased mobility       PT Treatment Interventions Gait training;Functional mobility training;Balance training;Therapeutic exercise;Patient/family education;Therapeutic activities    PT Goals (Current goals can be found in the Care Plan section)  Acute Rehab PT Goals Patient Stated Goal: pt mostly non-verbal PT Goal Formulation: Patient unable to participate in goal setting Time For Goal Achievement: 09/07/16 Potential to Achieve Goals: Good    Frequency Min 3X/week   Barriers to discharge Other (comment) unclear if group home can provide min A for mobility    Co-evaluation               End of Session Equipment Utilized During Treatment: Gait belt Activity Tolerance: Patient tolerated treatment well Patient left: in chair;with call bell/phone within reach;with chair alarm set Nurse Communication: Mobility status PT Visit Diagnosis: Difficulty in walking, not elsewhere classified (R26.2)    Time: 1610-9604 PT Time Calculation (min) (ACUTE ONLY): 19 min   Charges:   PT Evaluation $PT Eval Moderate Complexity: 1 Procedure     PT G Codes:   PT G-Codes **NOT FOR INPATIENT CLASS** Functional Assessment Tool Used: AM-PAC 6 Clicks Basic Mobility Functional Limitation: Mobility: Walking and moving around Mobility: Walking and Moving Around Current Status ([id number]): At least 40 percent but less than 60 percent impaired, limited or restricted Mobility: Walking and Moving Around Goal Status [phone]): At least 20 percent but less than 40 percent impaired, limited or restricted      Tamala Ser 08/24/2016, 2:04 PM [phone]

## 2016-08-24 NOTE — Progress Notes (Signed)
VASCULAR LAB PRELIMINARY  PRELIMINARY  PRELIMINARY  PRELIMINARY  Carotid duplex completed.    Preliminary report:  1-39% ICA plaquing. Vertebral artery flow is antegrade.   Dacoda Spallone, RVT 08/24/2016, 10:48 AM

## 2016-08-24 NOTE — Progress Notes (Signed)
Patient passed swallow with speech therapist. Yogurt is offered to patient while he waits for breakfast. Staff from the group home is at patient's bedside-he introduced himself as patient's nurse from the group home. He agrees to accompany patient to CT. Will continue to monitor

## 2016-08-24 NOTE — Progress Notes (Signed)
[facility] notified that patient is refusing Telemerty placement. Awaiting orders

## 2016-08-24 NOTE — Progress Notes (Signed)
RN called because pt agitated with tele and will not wear. Reviewed chart. VSS. Slightly brady 58-60s HR. No hx of acute cardiac issues. Given agitation and MR, will allow no tele at present.  KJKG, NP [facility]

## 2016-08-24 NOTE — Progress Notes (Signed)
PROGRESS NOTE  Travis Hanna:[id number] DOB: 02-Jun-1955 DOA: 08/23/2016 PCP: Lucretia Field   LOS: 0 days   Brief Narrative: Travis Hanna is a 61 y.o. male with medical history of MR, Down's syndrome who is brought by a care taker from a group home. He was noted to start stumbling yesterday. He is usually able to ambulate without issues. The care taker who is with him today states she feels that he is falling over to the right side. He is not able to talk and cannot give a history. When he was walked in the ER, the ER Doctor notes he was dragging his right leg  Assessment & Plan: Principal Problem:   Right leg weakness Active Problems:   Down syndrome   Hypothyroidism   Right leg weakness - usually does not need any assistive devices to ambulate, CT head negative on admission however repeat CT scan today showed a "subtle hypodensity LEFT periventricular white matter could represent developing nonhemorrhagic infarct".  2D echo, carotid duplex, PT evaluation pending. Obtain lipid panel, A1c.  HTN? - hold Vasotec for now until certain that he has not had and acute CVA  Hypothyroidism -cont synthroid  GERD -cont PPI  Down syndrome  MR -able to feed himself, understand some commands, has control of bowel and bladder but has to be encouraged to go to the bathroom  Not sure why he is on Tegretaol but will continue  DVT prophylaxis: Lovenox Code Status: Full code Family Communication: no family at bedside Disposition Plan: group home 1 day once stroke workup complete  Consultants:   Neurology   Procedures:   None   Antimicrobials:  None    Subjective: -non-verbal, not following commands. Occasionally screaming   Objective: Vitals:   08/23/16 2345 08/24/16 0145 08/24/16 0345 08/24/16 1004  BP: 117/80 124/77 128/79 100/63  Pulse: (!) 58 (!) 59 (!) 57 (!) 59  Resp: Temp:  97.9 F (36.6 C) 97.7 F (36.5 C) 97.9 F (36.6 C)  TempSrc:  Oral  Oral Oral  SpO2:  98% 97% 97%  Weight:      Height:       No intake or output data in the 24 hours ending 08/24/16 1200 Filed Weights   08/23/16 1603  Weight: 56.3 kg (124 lb 1.6 oz)    Examination: Constitutional: NAD Vitals:   08/23/16 2345 08/24/16 0145 08/24/16 0345 08/24/16 1004  BP: 117/80 124/77 128/79 100/63  Pulse: (!) 58 (!) 59 (!) 57 (!) 59  Resp: Temp:  97.9 F (36.6 C) 97.7 F (36.5 C) 97.9 F (36.6 C)  TempSrc:  Oral Oral Oral  SpO2:  98% 97% 97%  Weight:      Height:       Respiratory: clear to auscultation bilaterally, no wheezing, no crackles. Cardiovascular: Regular rate and rhythm, no murmurs / rubs / gallops. No LE edema.  Abdomen: no tenderness. Bowel sounds positive.  Musculoskeletal: no clubbing / cyanosis. Skin: no rashes, lesions, ulcers. No induration Neurologic: not following commands consistently. Moves all 4.   Data Reviewed: I have personally reviewed following labs and imaging studies  CBC:  Recent Labs Lab 08/23/16 1011  WBC 5.6  NEUTROABS 4.4  HGB 14.6  HCT 43.6  MCV 107.4*  PLT 195   Basic Metabolic Panel:  Recent Labs Lab 08/23/16 1011  NA 141  K 4.1  CL 104  CO2 29  GLUCOSE 94  BUN 20  CREATININE 0.96  CALCIUM 8.7*   GFR: Estimated Creatinine Clearance: 62 mL/min (by C-G formula based on SCr of 0.96 mg/dL). Liver Function Tests:  Recent Labs Lab 08/23/16 1011  AST 25  ALT 15*  ALKPHOS 88  BILITOT 0.7  PROT 7.3  ALBUMIN 3.9    Recent Labs Lab 08/23/16 1011  LIPASE 24   No results for input(s): AMMONIA in the last 168 hours. Coagulation Profile:  Recent Labs Lab 08/23/16 1011  INR 1.09   Cardiac Enzymes: No results for input(s): CKTOTAL, CKMB, CKMBINDEX, TROPONINI in the last 168 hours. BNP (last 3 results) No results for input(s): PROBNP in the last 8760 hours. HbA1C: No results for input(s): HGBA1C in the last 72 hours. CBG: No results for input(s): GLUCAP in the last 168  hours. Lipid Profile:  Recent Labs  08/24/16 0809  CHOL 157  HDL 39*  LDLCALC 104*  TRIG 70  CHOLHDL 4.0   Thyroid Function Tests: No results for input(s): TSH, T4TOTAL, FREET4, T3FREE, THYROIDAB in the last 72 hours. Anemia Panel:  Recent Labs  08/24/16 0809  VITAMINB12 422   Urine analysis:    Component Value Date/Time   COLORURINE YELLOW 07/21/2016 1647   APPEARANCEUR CLEAR 07/21/2016 1647   LABSPEC 1.011 07/21/2016 1647   PHURINE 7.0 07/21/2016 1647   GLUCOSEU NEGATIVE 07/21/2016 1647   HGBUR NEGATIVE 07/21/2016 1647   BILIRUBINUR NEGATIVE 07/21/2016 1647   KETONESUR NEGATIVE 07/21/2016 1647   PROTEINUR NEGATIVE 07/21/2016 1647   UROBILINOGEN 0.2 06/25/2013 2011   NITRITE NEGATIVE 07/21/2016 1647   LEUKOCYTESUR NEGATIVE 07/21/2016 1647   Sepsis Labs: Invalid input(s): PROCALCITONIN, LACTICIDVEN  No results found for this or any previous visit (from the past 240 hour(s)).    Radiology Studies: Dg Chest 2 View  Result Date: 08/23/2016 CLINICAL DATA:  61 year old male with Down syndrome. Nonverbal. Increased weakness, limping on the right leg, unable to walk since yesterday. EXAM: CHEST  2 VIEW COMPARISON:  07/21/2016 and earlier. FINDINGS: Semi upright AP and lateral views of the chest. More rotated to the right on the AP view. Skin fold artifact over the left chest. Stable lung volumes. Stable mediastinal contours, mild tortuosity of the thoracic aorta. No pneumothorax, pulmonary edema, pleural effusion or confluent pulmonary opacity. Levoconvex thoracolumbar scoliosis and kyphosis. No acute osseous abnormality identified. Negative visible bowel gas pattern. IMPRESSION: No acute cardiopulmonary abnormality. Electronically Signed   By: Odessa Fleming M.D.   On: 08/23/2016 10:02   Dg Pelvis 1-2 Views  Result Date: 08/23/2016 CLINICAL DATA:  61 year old male with Down syndrome. Nonverbal. Increased weakness, limping on the right leg, unable to walk since yesterday. EXAM:  PELVIS - 1-2 VIEW COMPARISON:  CT Abdomen and Pelvis 06/25/2013 FINDINGS: Bilateral femoral heads are normally located. Hip joint spaces are stable and within normal limits. Both proximal femurs appear grossly intact. No pelvis fracture identified. Sacral ala and SI joints appear stable and within normal limits. Partially visible lumbar levoconvex scoliosis. Negative visible bowel gas pattern. IMPRESSION: No acute osseous abnormality identified about the pelvis. If there is lateralizing hip pain then a dedicated hip series would be recommended. Electronically Signed   By: Odessa Fleming M.D.   On: 08/23/2016 10:03   Ct Head Wo Contrast  Result Date: 08/24/2016 CLINICAL DATA:  RIGHT-sided weakness and unsteady gait. History of Down's syndrome. EXAM: CT HEAD WITHOUT CONTRAST TECHNIQUE: Contiguous axial images were obtained from the base of the skull through the vertex without intravenous contrast. COMPARISON:  CT head 08/23/2016. FINDINGS:  Brain: There is subtle hypodensity in the LEFT periventricular centrum semiovale, which could represent developing deep white matter infarct, see image 15 series 3. No cortical hypodensity is evident. Chronic LEFT thalamic lacunar infarct redemonstrated. No hydrocephalus, hemorrhage, or extra-axial fluid. Incidental small colloid cyst anterior third ventricle, 4 mm. Vascular: No hyperdense vessel or unexpected calcification. Skull: Normal. Negative for fracture or focal lesion. Sinuses/Orbits: No acute finding. Other: None. IMPRESSION: Subtle hypodensity LEFT periventricular white matter could represent developing nonhemorrhagic infarct. Incidental small colloid cyst, 4 mm, without hydrocephalus. Electronically Signed   By: Elsie Stain M.D.   On: 08/24/2016 09:47   Ct Head Wo Contrast  Result Date: 08/23/2016 CLINICAL DATA:  Right leg weakness and limping. Unable to emboli from last 24 hours EXAM: CT HEAD WITHOUT CONTRAST TECHNIQUE: Contiguous axial images were obtained from the  base of the skull through the vertex without intravenous contrast. COMPARISON:  None. FINDINGS: Brain: Chronic left thalamus lacunar infarct noted. Low attenuation throughout the subcortical and periventricular white matter identified compatible with chronic small vessel ischemic disease. Prominence of the sulci and ventricles noted. Vascular: No hyperdense vessel or unexpected calcification. Skull: Normal. Negative for fracture or focal lesion. Sinuses/Orbits: No acute finding. Other: None. IMPRESSION: 1. No acute intracranial abnormalities. 2. 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 Electronically Signed   By: Signa Kell M.D.   On: 08/23/2016 10:12   Dg Knee Complete 4 Views Right  Result Date: 08/23/2016 CLINICAL DATA:  61 year old male with Down syndrome. Nonverbal. Increased weakness, limping on the right leg, unable to walk since yesterday. EXAM: RIGHT KNEE - COMPLETE 4+ VIEW COMPARISON:  None. FINDINGS: No joint effusion on cross-table lateral view. Mild tricompartmental degenerative spurring, and spurring of the tibial intercondylar eminence. Joint spaces are within normal limits for age. No acute osseous abnormality identified. IMPRESSION: Mild tricompartmental degenerative changes. No acute osseous abnormality at the right knee. Electronically Signed   By: Odessa Fleming M.D.   On: 08/23/2016 10:04     Scheduled Meds: .  stroke: mapping our early stages of recovery book   Does not apply Once  . aspirin EC  81 mg Oral Daily  . atorvastatin  40 mg Oral q1800  . carbamazepine  200 mg Oral BID  . enalapril  10 mg Oral Daily  . enoxaparin (LOVENOX) injection  40 mg Subcutaneous Q24H  . lactulose  20 g Oral Daily  . levothyroxine  50 mcg Oral QAC breakfast  . nabumetone  500 mg Oral Q breakfast  . pantoprazole  40 mg Oral Daily  . sodium chloride flush  3 mL Intravenous Q12H   Continuous Infusions:  Pamella Pert, MD, PhD [facility] Pager [phone]  If  7PM-7AM, please contact night-coverage [email or web address] Password [email or web address] 08/24/2016, 12:00 PM

## 2016-08-24 NOTE — Progress Notes (Signed)
  Echocardiogram 2D Echocardiogram has been performed.  Travis Hanna 08/24/2016, 11:17 AM

## 2016-08-24 NOTE — Progress Notes (Signed)
Nutrition Brief Note  Patient identified due to low Braden score  Wt Readings from Last 15 Encounters:  08/23/16 124 lb 1.6 oz (56.3 kg)  07/21/16 120 lb (54.4 kg)  09/04/13 128 lb (58.1 kg)  06/29/13 135 lb (61.2 kg)  06/27/13 141 lb 1.5 oz (64 kg)  04/09/11 150 lb (68 kg)    Body mass index is 23.45 kg/m. Patient meets criteria for Normal Weight based on current BMI.   Current diet order is Dysphagia 3 with thin liquids. Pt is nonverbal and unable to follow commands per RN. RN reports that pt passed swallow evaluation this morning and quickly ate a yogurt. Pt out of room for procedure at time of visit; he did not have a chance to eat breakfast meal. RN states that pt seems to have a good appetite. Pt's weight is up 4 lbs from last month and is WNL. RN instructed to consult dietitians if nutrition issues arise.  Labs and medications reviewed.   No nutrition interventions warranted at this time. If nutrition issues arise, please consult RD.   Dorothea Ogle RD, LDN, CSP Inpatient Clinical Dietitian Pager: [phone] After Hours Pager: [phone]

## 2016-08-24 NOTE — Evaluation (Signed)
Clinical/Bedside Swallow Evaluation Patient Details  Name: Travis Hanna MRN: [id number] Date of Birth: Sep 14, 1955  Today's Date: 08/24/2016 Time: SLP Start Time (ACUTE ONLY): 0816 SLP Stop Time (ACUTE ONLY): 0829 SLP Time Calculation (min) (ACUTE ONLY): 13 min  Past Medical History:  Past Medical History:  Diagnosis Date  . Anemia   . Down syndrome   . MR (mental retardation)   . Myopia with astigmatism   . Osteoporosis    Past Surgical History: History reviewed. No pertinent surgical history. HPI:  Ptis a 61 y.o.malewith medical history of MR and Down's syndrome who is brought by a care taker from a group home. He was noted to start stumbling yesterday and dragging his right leg. CXR showed no acute cardiopulmonary abnormality. CT of head showed no acute intracranial abnormalities. 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 Per chart, Travis Hanna is nonverbal.   Assessment / Plan / Recommendation Clinical Impression  Travis Hanna nonverbal during bedside swallow evaluation, however was moaning. Full oral motor assessment was not completed due to mentation and MR, although oral cavity appeared [facility] with some missing dentition. Travis Hanna passed 3-oz water test with no overt s/s of aspiration at bedside. Mastication of regular solids was efficient and transit of Dys 1 (pureed) solids was timely through the oral cavity. Educated Travis Hanna and RN re: diet recommendations and treatment plan. Caregiver relayed to RN that Travis Hanna "eats good." Given Travis Hanna's mentation and need for feeding assistance, recommend initiation of Dys 3 (mechanical soft) solids, thin liquids (straws ok), meds crushed. ST will f/u briefly (x1?) to assess diet tolerance and faciliate safe swallow mechanism.    SLP Visit Diagnosis: Dysphagia, unspecified (R13.10)    Aspiration Risk  Mild aspiration risk    Diet Recommendation Dysphagia 3 (Mech soft);Thin liquid   Liquid Administration via: Cup;Straw Medication Administration:  Crushed with puree Supervision: Full supervision/cueing for compensatory strategies;Staff to assist with self feeding Compensations: Slow rate;Small sips/bites;Minimize environmental distractions Postural Changes: Seated upright at 90 degrees    Other  Recommendations Oral Care Recommendations: Oral care BID   Follow up Recommendations Skilled Nursing facility;24 hour supervision/assistance      Frequency and Duration min 1 x/week  1 week       Prognosis Prognosis for Safe Diet Advancement: Good Barriers to Reach Goals: Cognitive deficits      Swallow Study   General HPI: Ptis a 61 y.o.malewith medical history of MR and Down's syndrome who is brought by a care taker from a group home. He was noted to start stumbling yesterday and dragging his right leg. CXR showed no acute cardiopulmonary abnormality. CT of head showed no acute intracranial abnormalities. 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 Per chart, Travis Hanna is nonverbal. Type of Study: Bedside Swallow Evaluation Previous Swallow Assessment: 03/23/2004 MBS Diet Prior to this Study: NPO Temperature Spikes Noted: No Respiratory Status: Room air History of Recent Intubation: No Behavior/Cognition: Alert;Requires cueing Oral Cavity Assessment: Within Functional Limits Oral Care Completed by SLP: No Oral Cavity - Dentition: Missing dentition Vision: Functional for self-feeding Self-Feeding Abilities: Needs assist;Needs set up Patient Positioning: Upright in bed Baseline Vocal Quality: Normal Volitional Cough: Cognitively unable to elicit Volitional Swallow: Unable to elicit    Oral/Motor/Sensory Function Overall Oral Motor/Sensory Function:  (unable to assess due to mentation)   Ice Chips Ice chips: Not tested   Thin Liquid Thin Liquid: Within functional limits Presentation: Straw    Nectar Thick Nectar Thick Liquid: Not tested   Honey Thick Honey  Thick Liquid: Not tested   Puree Puree: Within functional  limits Presentation: Spoon   Solid   GO   Solid: Within functional limits Presentation: Self Fed        [employer] , Student-SLP 08/24/2016,10:52 AM

## 2016-08-25 DIAGNOSIS — I639 Cerebral infarction, unspecified: Secondary | ICD-10-CM | POA: Diagnosis present

## 2016-08-25 LAB — HEMOGLOBIN A1C
HEMOGLOBIN A1C: 5 % (ref 4.8–5.6)
Mean Plasma Glucose: 97 mg/dL

## 2016-08-25 MED ORDER — ASPIRIN 81 MG PO TBEC: 81.0000 mg | DELAYED_RELEASE_TABLET | Freq: Every day | ORAL | Status: DC

## 2016-08-25 MED ORDER — ATORVASTATIN CALCIUM 40 MG PO TABS: 40.0000 mg | ORAL_TABLET | Freq: Every day | ORAL | Status: DC

## 2016-08-25 NOTE — Progress Notes (Signed)
  Speech Language Pathology Treatment: Dysphagia  Patient Details Name: Travis Hanna MRN: [id number] DOB: November 18, 1955 Today's Date: 08/25/2016 Time: 1002-1010 SLP Time Calculation (min) (ACUTE ONLY): 8 min  Assessment / Plan / Recommendation Clinical Impression  Travis Hanna was seen for dysphagia treatment and appeared drowsy requiring mod verbal/visual/tactile stimulation and cueing to arouse and to attend to POs. Observed mildly prolonged mastication with regular solids, however this was likely due to drowsiness and decreased alertness. No coughing, throat clearing, or indications of airway compromise at bedside. Educated pt re: diet recommendations. Recommend upgrading from Dys 3 solids to regular solids, continue thin liquids, meds crushed. Pt has met goals; ST signing off.   HPI HPI: Ptis a 61 y.o.malewith medical history of MR and Down's syndrome who is brought by a care taker from a group home. He was noted to start stumbling yesterday and dragging his right leg. CXR showed no acute cardiopulmonary abnormality. CT of head showed no acute intracranial abnormalities. 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 Per chart, pt is nonverbal.      SLP Plan  All goals met       Recommendations  Diet recommendations: Regular;Thin liquid Liquids provided via: Cup;Straw Medication Administration: Crushed with puree Supervision: Staff to assist with self feeding;Full supervision/cueing for compensatory strategies Compensations: Slow rate;Small sips/bites;Minimize environmental distractions Postural Changes and/or Swallow Maneuvers: Seated upright 90 degrees                Oral Care Recommendations: Oral care BID Follow up Recommendations: Skilled Nursing facility;24 hour supervision/assistance SLP Visit Diagnosis: Dysphagia, unspecified (R13.10) Plan: All goals met       GO                Travis Hanna , Student-SLP 08/25/2016, 11:48 AM

## 2016-08-25 NOTE — Progress Notes (Signed)
Report attempted to be called into group home. No one answered phone.

## 2016-08-25 NOTE — Evaluation (Signed)
Occupational Therapy Evaluation Patient Details Name: Travis Hanna MRN: [id number] DOB: 1955/08/19 Today's Date: 08/25/2016    History of Present Illness 61 y.o. male with medical history of MR and Down's syndrome who is brought by a care taker from a group home. He was noted to start stumbling yesterday and dragging his right leg. CT head showed possible developing L periventricular infarct.    Clinical Impression   Pt admitted with above. He demonstrates the below listed deficits and currently requires mod - max A for ADLs and min - mod A for functional mobility.  Spoke with Earl Lites at group home.  Pt is currently requiring more assist than he did previously.  Plan is for discharge back to group home today.  Recommend pediatric w/c and HHOT.   Will sign off at this time.       Follow Up Recommendations  Outpatient OT;Supervision/Assistance - 24 hour    Equipment Recommendations  Other (comment) (pediatric w/c )    Recommendations for Other Services       Precautions / Restrictions Precautions Precautions: Fall Restrictions Weight Bearing Restrictions: No      Mobility Bed Mobility Overal bed mobility: Needs Assistance Bed Mobility: Supine to Sit;Sit to Supine     Supine to sit: Mod assist Sit to supine: Min assist   General bed mobility comments: Pt is slow to initiate task.  He requires assist to move LEs off bed and to lift trunk   Transfers Overall transfer level: Needs assistance Equipment used: 1 person hand held assist;Rolling walker (2 wheeled) Transfers: Sit to/from [employer] Sit to Stand: Min assist;Min guard Stand pivot transfers: Min assist;Mod assist       General transfer comment: variable performance.  When turning to Rt, pt at times loses balance heavily requiring mod A to recover     Balance Overall balance assessment: Needs assistance Sitting-balance support: Feet supported Sitting balance-Leahy Scale: Fair Sitting  balance - Comments: able to maintain static sitting EOB with supervision    Standing balance support: Single extremity supported Standing balance-Leahy Scale: Poor Standing balance comment: requires min - mod A and UE support                            ADL either performed or assessed with clinical judgement   ADL Overall ADL's : Needs assistance/impaired Eating/Feeding: Minimal assistance;Sitting;Bed level   Grooming: Oral care;Wash/dry face;Wash/dry hands;Moderate assistance;Standing Grooming Details (indicate cue type and reason): Pt requires assist due to decreased thoroughness and requires assist for balance  Upper Body Bathing: Maximal assistance;Sitting   Lower Body Bathing: Maximal assistance;Sit to/from stand   Upper Body Dressing : Maximal assistance;Sitting   Lower Body Dressing: Moderate assistance;Sit to/from stand Lower Body Dressing Details (indicate cue type and reason): Pt requires min A to don socks while seated EOB  Toilet Transfer: Moderate assistance;Ambulation;Comfort height toilet;Grab bars;RW Toilet Transfer Details (indicate cue type and reason): Pt loses balance heavily when turning to right requiring mod A to recover.   Toileting- Clothing Manipulation and Hygiene: Maximal assistance;Sit to/from stand       Functional mobility during ADLs: Minimal assistance;Moderate assistance;Rolling walker (HHA )       Vision   Additional Comments: pt with bil. esotropia.  Unable to accurately assess vision      Perception     Praxis      Pertinent Vitals/Pain Pain Assessment: Faces Pain Score: 0-No pain Faces Pain Scale: No hurt  Hand Dominance  (unsure.  Pt spontaneously used Lt UE for grooming )   Extremity/Trunk Assessment Upper Extremity Assessment Upper Extremity Assessment: RUE deficits/detail RUE Deficits / Details: Rt shoulder grossly 3/5; elbow distally 3+/5.  Pt slow to use/incorporate Rt UE into activity  RUE Coordination:  decreased fine motor;decreased gross motor   Lower Extremity Assessment Lower Extremity Assessment: Defer to PT evaluation   Cervical / Trunk Assessment Cervical / Trunk Assessment: Kyphotic   Communication Communication Communication: Expressive difficulties   Cognition Arousal/Alertness: Awake/alert;Lethargic Behavior During Therapy: WFL for tasks assessed/performed Overall Cognitive Status: History of cognitive impairments - at baseline                                 General Comments: Pt subtly nod/shakes head for yes/no responses.  He requires encouragement to participate and indicates fatigue    General Comments  PT piggy backed on OT initially, but pt responding better to both therapists encouraging and interacting with him to facilitate maximal performance     Exercises     Shoulder Instructions      Home Living Family/patient expects to be discharged to:: Group home                                        Prior Functioning/Environment Level of Independence: Needs assistance  Gait / Transfers Assistance Needed: Pt required HHA per Earl Lites at pt's group home ADL's / Homemaking Assistance Needed: Per Earl Lites, at pt's group home, pt required assistance with bathing, dressing, grooming, and required scheduled toileting  Communication / Swallowing Assistance Needed: pt is non verbal           OT Problem List: Decreased strength;Decreased range of motion;Decreased activity tolerance;Impaired balance (sitting and/or standing);Decreased cognition;Decreased safety awareness;Decreased knowledge of use of DME or AE;Impaired UE functional use      OT Treatment/Interventions:      OT Goals(Current goals can be found in the care plan section) Acute Rehab OT Goals Patient Stated Goal: pt unable  OT Goal Formulation: All assessment and education complete, DC therapy Potential to Achieve Goals: Good  OT Frequency:     Barriers to D/C:             Co-evaluation PT/OT/SLP Co-Evaluation/Treatment: Yes Reason for Co-Treatment: Necessary to address cognition/behavior during functional activity;For patient/therapist safety PT goals addressed during session: Mobility/safety with mobility;Balance;Proper use of DME OT goals addressed during session: ADL's and self-care      End of Session Equipment Utilized During Treatment: Rolling walker;Gait belt Nurse Communication: Mobility status  Activity Tolerance: Patient tolerated treatment well Patient left: in bed;with call bell/phone within reach;with bed alarm set  OT Visit Diagnosis: Hemiplegia and hemiparesis Hemiplegia - Right/Left: Right Hemiplegia - caused by: Cerebral infarction                Time: 1610-9604 OT Time Calculation (min): 29 min Charges:  OT General Charges $OT Visit: 1 Procedure OT Evaluation $OT Eval Moderate Complexity: 1 Procedure G-Codes:     Jeani Hawking, OTR/L [phone]   Ranata Laughery M 08/25/2016, 11:27 AM

## 2016-08-25 NOTE — Progress Notes (Signed)
Patient very agitated assisted to bathroom hand held and he voided, but he refused to get back into bed he kept attempting to get to the floor so I eased him down to the floor he sat there for about 5 minutes and attempted to get up. I called for assistance and NT came he got him to get in the bed and he took of his gown. Patient was adjusted to bed and bed alarm placed. Telemetry removed and CMT was notified. Curtain was pulled to provide privacy since he didn't want to have gown door left open. Will continue to monitor.

## 2016-08-25 NOTE — Discharge Summary (Signed)
Physician Discharge Summary  Travis Hanna ZDG:[id number] DOB: [birth year] DOA: 08/23/2016  PCP: Lucretia Field  Admit date: 08/23/2016 Discharge date: 08/25/2016  Admitted From: group home Disposition:  Group home  Recommendations for Outpatient Follow-up:  1. Follow up with PCP in 1-2 weeks 2. Patient started on Aspirin and Statin on d/c  Discharge Condition: stable CODE STATUS: Full code Diet recommendation: heart healthy  HPI: Per Dr. Butler Denmark, Travis Hanna is a 61 y.o. male with medical history of MR, Down's syndrome who is brought by a care taker from a group home. He was noted to start stumbling yesterday. He is usually able to ambulate without issues. The care taker who is with him today states she feels that he is falling over to the right side. He is not able to talk and cannot give a history. When he was walked in the ER, the ER Doctor notes he was dragging his right leg.  Hospital Course: Discharge Diagnoses:  Principal Problem:   Right leg weakness Active Problems:   Down syndrome   Hypothyroidism   CVA (cerebral vascular accident) (HCC)   Right leg weakness - CT head negative on admission, however repeat CT scan 4/19 showed a "subtle hypodensity LEFT periventricular white matter could represent developing nonhemorrhagic infarct". Neurology consulted and have followed patient while hospitalized. Unfortunately patient not cooperating with most of the testing and MRI could not be obtained but he underwent complete stroke workup with 2D echo, which showed normal EF, carotid duplex which showed 1-39% ICA plaquing. Vertebral artery flow is antegrade. His A1C was 5.0. His LDL was 104. He was started on Aspirin and statin. He improved, appears back to baseline and will be discharged to group home in stable condition.  HTN -resume home medications Hypothyroidism -cont synthroid GERD -cont PPI Down syndrome MR -at baseline   Discharge Instructions   Allergies  as of 08/25/2016      Reactions   Carbapenems Other (See Comments)   unknown   Cephalosporins Other (See Comments)   unknown   Other Other (See Comments)   Allergic to Betalactams per group home-unknown reaction Allergic to Betalactams per group home   Penicillamine Other (See Comments)   unknown   Penicillins Other (See Comments)   unknown      Medication List    TAKE these medications   alendronate 70 MG tablet Commonly known as:  FOSAMAX Take 70 mg by mouth every Friday. Take with a full glass of water on an empty stomach.   ARTIFICIAL TEARS 1.4 % ophthalmic solution Generic drug:  polyvinyl alcohol Place 1 drop into both eyes 3 (three) times daily.   aspirin 81 MG EC tablet Take 1 tablet (81 mg total) by mouth daily. Start taking on:  08/26/2016   atorvastatin 40 MG tablet Commonly known as:  LIPITOR Take 1 tablet (40 mg total) by mouth daily at 6 PM.   Calcium Carbonate-Vitamin D 600-400 MG-UNIT tablet Take 1 tablet by mouth 2 (two) times daily.   carBAMazepine 100 MG/5ML suspension Commonly known as:  TEGRETOL Take 200 mg by mouth 2 (two) times daily.   cetaphil lotion Apply 1 application topically See admin instructions. Apply topically to face daily   enalapril 10 MG tablet Commonly known as:  VASOTEC Take 10 mg by mouth at bedtime.   esomeprazole 40 MG capsule Commonly known as:  NEXIUM Take 40 mg by mouth at bedtime.   ketoconazole 2 % shampoo Commonly known as:  NIZORAL Apply  1 application topically every Monday, Wednesday, and Friday.   lactulose 10 GM/15ML solution Commonly known as:  CHRONULAC Take 13.3 g by mouth daily.   lubiprostone 8 MCG capsule Commonly known as:  AMITIZA Take 8 mcg by mouth 2 (two) times daily with a meal.   metroNIDAZOLE 1 % gel Commonly known as:  METROGEL Apply 1 application topically See admin instructions. Apply to face every evening for acne   mometasone 0.1 % cream Commonly known as:  ELOCON Apply 1  application topically See admin instructions. Apply sparingly to nasal bridge every other day   nabumetone 500 MG tablet Commonly known as:  RELAFEN Take 500 mg by mouth daily.   polyethylene glycol packet Commonly known as:  MIRALAX / GLYCOLAX Take 17 g by mouth daily.   SYNTHROID 50 MCG tablet Generic drug:  levothyroxine Take 50 mcg by mouth daily before breakfast.   Vitamin D3 2000 units Tabs Take 2,000 Units by mouth daily.       Allergies  Allergen Reactions  . Carbapenems Other (See Comments)    unknown  . Cephalosporins Other (See Comments)    unknown  . Other Other (See Comments)    Allergic to Betalactams per group home-unknown reaction Allergic to Betalactams per group home  . Penicillamine Other (See Comments)    unknown  . Penicillins Other (See Comments)    unknown    Consultations:  Neurology   Procedures/Studies:  2D echo  Study Conclusions - Left ventricle: The cavity size was normal. Systolic function was normal. The estimated ejection fraction was in the range of 60% to 65%. Although no diagnostic regional wall motion abnormality was identified, this possibility cannot be completely excluded on the basis of this study. - Tricuspid valve: There was mild-moderate regurgitation. - Pericardium, extracardiac: A small pericardial effusion was identified circumferential to the heart.  Dg Chest 2 View  Result Date: 08/23/2016 CLINICAL DATA:  61 year old male with Down syndrome. Nonverbal. Increased weakness, limping on the right leg, unable to walk since yesterday. EXAM: CHEST  2 VIEW COMPARISON:  07/21/2016 and earlier. FINDINGS: Semi upright AP and lateral views of the chest. More rotated to the right on the AP view. Skin fold artifact over the left chest. Stable lung volumes. Stable mediastinal contours, mild tortuosity of the thoracic aorta. No pneumothorax, pulmonary edema, pleural effusion or confluent pulmonary opacity. Levoconvex thoracolumbar  scoliosis and kyphosis. No acute osseous abnormality identified. Negative visible bowel gas pattern. IMPRESSION: No acute cardiopulmonary abnormality. Electronically Signed   By: Odessa Fleming M.D.   On: 08/23/2016 10:02   Dg Pelvis 1-2 Views  Result Date: 08/23/2016 CLINICAL DATA:  61 year old male with Down syndrome. Nonverbal. Increased weakness, limping on the right leg, unable to walk since yesterday. EXAM: PELVIS - 1-2 VIEW COMPARISON:  CT Abdomen and Pelvis 06/25/2013 FINDINGS: Bilateral femoral heads are normally located. Hip joint spaces are stable and within normal limits. Both proximal femurs appear grossly intact. No pelvis fracture identified. Sacral ala and SI joints appear stable and within normal limits. Partially visible lumbar levoconvex scoliosis. Negative visible bowel gas pattern. IMPRESSION: No acute osseous abnormality identified about the pelvis. If there is lateralizing hip pain then a dedicated hip series would be recommended. Electronically Signed   By: Odessa Fleming M.D.   On: 08/23/2016 10:03   Ct Head Wo Contrast  Result Date: 08/24/2016 CLINICAL DATA:  RIGHT-sided weakness and unsteady gait. History of Down's syndrome. EXAM: CT HEAD WITHOUT CONTRAST TECHNIQUE: Contiguous axial images were  obtained from the base of the skull through the vertex without intravenous contrast. COMPARISON:  CT head 08/23/2016. FINDINGS: Brain: There is subtle hypodensity in the LEFT periventricular centrum semiovale, which could represent developing deep white matter infarct, see image 15 series 3. No cortical hypodensity is evident. Chronic LEFT thalamic lacunar infarct redemonstrated. No hydrocephalus, hemorrhage, or extra-axial fluid. Incidental small colloid cyst anterior third ventricle, 4 mm. Vascular: No hyperdense vessel or unexpected calcification. Skull: Normal. Negative for fracture or focal lesion. Sinuses/Orbits: No acute finding. Other: None. IMPRESSION: Subtle hypodensity LEFT periventricular  white matter could represent developing nonhemorrhagic infarct. Incidental small colloid cyst, 4 mm, without hydrocephalus. Electronically Signed   By: Elsie Stain M.D.   On: 08/24/2016 09:47   Ct Head Wo Contrast  Result Date: 08/23/2016 CLINICAL DATA:  Right leg weakness and limping. Unable to emboli from last 24 hours EXAM: CT HEAD WITHOUT CONTRAST TECHNIQUE: Contiguous axial images were obtained from the base of the skull through the vertex without intravenous contrast. COMPARISON:  None. FINDINGS: Brain: Chronic left thalamus lacunar infarct noted. Low attenuation throughout the subcortical and periventricular white matter identified compatible with chronic small vessel ischemic disease. Prominence of the sulci and ventricles noted. Vascular: No hyperdense vessel or unexpected calcification. Skull: Normal. Negative for fracture or focal lesion. Sinuses/Orbits: No acute finding. Other: None. IMPRESSION: 1. No acute intracranial abnormalities. 2. 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 Electronically Signed   By: Signa Kell M.D.   On: 08/23/2016 10:12   Dg Knee Complete 4 Views Right  Result Date: 08/23/2016 CLINICAL DATA:  61 year old male with Down syndrome. Nonverbal. Increased weakness, limping on the right leg, unable to walk since yesterday. EXAM: RIGHT KNEE - COMPLETE 4+ VIEW COMPARISON:  None. FINDINGS: No joint effusion on cross-table lateral view. Mild tricompartmental degenerative spurring, and spurring of the tibial intercondylar eminence. Joint spaces are within normal limits for age. No acute osseous abnormality identified. IMPRESSION: Mild tricompartmental degenerative changes. No acute osseous abnormality at the right knee. Electronically Signed   By: Odessa Fleming M.D.   On: 08/23/2016 10:04     Subjective: -non verbal, alert  Discharge Exam: Vitals:   08/25/16 0119 08/25/16 0400  BP: 109/65 120/80  Pulse: 60 (!) 56  Resp: 18 18  Temp: 97.9 F (36.6 C) 98 F  (36.7 C)   Vitals:   08/24/16 1657 08/24/16 2110 08/25/16 0119 08/25/16 0400  BP: 95/67 114/67 109/65 120/80  Pulse: (!) 59 (!) 55 60 (!) 56  Resp: Temp: 98 F (36.7 C) 98 F (36.7 C) 97.9 F (36.6 C) 98 F (36.7 C)  TempSrc: Oral Oral Oral Oral  SpO2: 96% 98% 100% 99%  Weight:      Height:        General: Pt is alert, awake, not in acute distress Cardiovascular: RRR, S1/S2 +, no rubs, no gallops Respiratory: CTA bilaterally, no wheezing, no rhonchi    The results of significant diagnostics from this hospitalization (including imaging, microbiology, ancillary and laboratory) are listed below for reference.     Microbiology: No results found for this or any previous visit (from the past 240 hour(s)).   Labs: BNP (last 3 results) No results for input(s): BNP in the last 8760 hours. Basic Metabolic Panel:  Recent Labs Lab 08/23/16 1011  NA 141  K 4.1  CL 104  CO2 29  GLUCOSE 94  BUN 20  CREATININE 0.96  CALCIUM 8.7*   Liver Function  Tests:  Recent Labs Lab 08/23/16 1011  AST 25  ALT 15*  ALKPHOS 88  BILITOT 0.7  PROT 7.3  ALBUMIN 3.9    Recent Labs Lab 08/23/16 1011  LIPASE 24   No results for input(s): AMMONIA in the last 168 hours. CBC:  Recent Labs Lab 08/23/16 1011  WBC 5.6  NEUTROABS 4.4  HGB 14.6  HCT 43.6  MCV 107.4*  PLT 195   Cardiac Enzymes: No results for input(s): CKTOTAL, CKMB, CKMBINDEX, TROPONINI in the last 168 hours. BNP: Invalid input(s): POCBNP CBG: No results for input(s): GLUCAP in the last 168 hours. D-Dimer No results for input(s): DDIMER in the last 72 hours. Hgb A1c  Recent Labs  08/24/16 0809  HGBA1C 5.0   Lipid Profile  Recent Labs  08/24/16 0809  CHOL 157  HDL 39*  LDLCALC 104*  TRIG 70  CHOLHDL 4.0   Thyroid function studies No results for input(s): TSH, T4TOTAL, T3FREE, THYROIDAB in the last 72 hours.  Invalid input(s): FREET3 Anemia work up  Recent Labs   08/24/16 0809  VITAMINB12 422   Urinalysis    Component Value Date/Time   COLORURINE YELLOW 07/21/2016 1647   APPEARANCEUR CLEAR 07/21/2016 1647   LABSPEC 1.011 07/21/2016 1647   PHURINE 7.0 07/21/2016 1647   GLUCOSEU NEGATIVE 07/21/2016 1647   HGBUR NEGATIVE 07/21/2016 1647   BILIRUBINUR NEGATIVE 07/21/2016 1647   KETONESUR NEGATIVE 07/21/2016 1647   PROTEINUR NEGATIVE 07/21/2016 1647   UROBILINOGEN 0.2 06/25/2013 2011   NITRITE NEGATIVE 07/21/2016 1647   LEUKOCYTESUR NEGATIVE 07/21/2016 1647   Sepsis Labs Invalid input(s): PROCALCITONIN,  WBC,  LACTICIDVEN Microbiology No results found for this or any previous visit (from the past 240 hour(s)).   Time coordinating discharge: 40 minutes  SIGNED:  Pamella Pert, MD  [facility] 08/25/2016, 9:13 AM Pager [phone]  If 7PM-7AM, please contact night-coverage [email or web address] Password [email or web address]

## 2016-08-25 NOTE — Progress Notes (Signed)
HPI    (From Consult 08/23/2016) This is a 61-yo male with h/o MR and Down's who was brought to the ED by his caregiver for evaluation of difficulty walking. History is obtained from his caregiver who is present at the bedside. I have also reviewed available medical records. The patient is nonverbal and thus unable to provide any history.   His caregiver reports that she last saw him normal at 1500 on 08/22/16. She states that at 1700, one to her getting ready for dinner, she noticed that he was dragging his right leg when he was trying to walk. As he was sitting at the table trying to eat, she also noticed that he was not moving his right arm normally and instead of bringing his hand up to his mouth to eat he was bringing his head down to his hand which was near his plate. When symptoms persisted today, he was brought to the emergency department at [facility] for further evaluation. CT scan of the head was obtained and reportedly showed no acute abnormality. Due to concern for possible stroke, the patient was transferred to [facility] for neurologic assessment.   Subjective: Non verbal. Resting quietly. Follows some simple commands.   Objective: Current vital signs: BP 120/80 (BP Location: Left Arm)   Pulse (!) 56   Temp 98 F (36.7 C) (Oral)   Resp 18   Ht  (1.549 m)   Wt 56.3 kg (124 lb 1.6 oz)   SpO2 99%   BMI 23.45 kg/m  Vital signs in last 24 hours: Temp:  [97.9 F (36.6 C)-98 F (36.7 C)] 98 F (36.7 C) (04/20 0400) Pulse Rate:  [55-60] 56 (04/20 0400) Resp:  [16-18] 18 (04/20 0400) BP: (95-120)/(63-80) 120/80 (04/20 0400) SpO2:  [96 %-100 %] 99 % (04/20 0400)  Intake/Output from previous day: 04/19 0701 - 04/20 0700 In: 360 [P.O.:360] Out: -  Intake/Output this shift: No intake/output data recorded. Nutritional status: DIET DYS 3 Room service appropriate? Yes; Fluid consistency: Thin  Physical Exam  General - 61 yo male with Down's features in  NAD. Heart - Regular rate and rhythm - distant Lungs - Clear to auscultation Extremities - Distal pulses weak to absent - no edema Skin - Warm and dry  Neurologic Exam:   (Limited exam due to pt's inability to follow commands)  MENTAL STATUS: awake, non verbal at baseline. Follows some simple commands. CRANIAL NERVES: disconjugate eye movements.  Tongue midline.  MOTOR: moves all extremities. SENSORY: normal and symmetric to light touch  COORDINATION: finger-nose-finger performed with much difficulty and maximum cueing. REFLEXES: deep tendon reflexes present and symmetric  GAIT/STATION: not tested.   Lab Results: Basic Metabolic Panel:  Recent Labs Lab 08/23/16 1011  NA 141  K 4.1  CL 104  CO2 29  GLUCOSE 94  BUN 20  CREATININE 0.96  CALCIUM 8.7*    Liver Function Tests:  Recent Labs Lab 08/23/16 1011  AST 25  ALT 15*  ALKPHOS 88  BILITOT 0.7  PROT 7.3  ALBUMIN 3.9    Recent Labs Lab 08/23/16 1011  LIPASE 24   No results for input(s): AMMONIA in the last 168 hours.  CBC:  Recent Labs Lab 08/23/16 1011  WBC 5.6  NEUTROABS 4.4  HGB 14.6  HCT 43.6  MCV 107.4*  PLT 195    Cardiac Enzymes: No results for input(s): CKTOTAL, CKMB, CKMBINDEX, TROPONINI in the last 168 hours.  Lipid Panel:  Recent Labs Lab 08/24/16 0809  CHOL 157  TRIG 70  HDL 39*  CHOLHDL 4.0  VLDL 14  LDLCALC 161*    CBG: No results for input(s): GLUCAP in the last 168 hours.  Microbiology: Results for orders placed or performed during the hospital encounter of 07/21/16  Blood culture (routine x 2)     Status: None   Collection Time: 07/21/16  2:55 PM  Result Value Ref Range Status   Specimen Description BLOOD LEFT HAND  Final   Special Requests IN PEDIATRIC BOTTLE 5CC  Final   Culture   Final    NO GROWTH 5 DAYS Performed at [facility], [address], [place], [place] [address]    Report Status 07/26/2016 FINAL  Final    Coagulation  Studies:  Recent Labs  08/23/16 1011  LABPROT 14.1  INR 1.09    Imaging:   Dg Chest 2 View 08/23/2016 No acute cardiopulmonary abnormality.    Dg Pelvis 1-2 Views 08/23/2016 No acute osseous abnormality identified about the pelvis. If there is lateralizing hip pain then a dedicated hip series would be recommended.    Ct Head Wo Contrast 08/24/2016 Subtle hypodensity LEFT periventricular white matter could represent developing nonhemorrhagic infarct.  Incidental small colloid cyst, 4 mm, without hydrocephalus.    Ct Head Wo Contrast  08/23/2016 1. No acute intracranial abnormalities.  2. 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    Dg Knee Complete 4 Views Right 08/23/2016 Mild tricompartmental degenerative changes. No acute osseous abnormality at the right knee.    Transthoracic Echo 08/24/2016 Study Conclusions - Left ventricle: The cavity size was normal. Systolic function was   normal. The estimated ejection fraction was in the range of 60% to 65%.    Although no diagnostic regional wall motion abnormality   was identified, this possibility cannot be completely excluded on   the basis of this study. - Tricuspid valve: There was mild-moderate regurgitation. - Pericardium, extracardiac: A small pericardial effusion was   identified circumferential to the heart. Impressions: No cardiac source of embolism was identified, but cannot be ruled out on the basis of this examination.   Carotid duplex 08/24/2016 1-39% ICA plaquing. Vertebral artery flow is antegrade.    Medications:  Scheduled: .  stroke: mapping our early stages of recovery book   Does not apply Once  . aspirin EC  81 mg Oral Daily  . atorvastatin  40 mg Oral q1800  . carbamazepine  200 mg Oral BID  . enalapril  10 mg Oral Daily  . enoxaparin (LOVENOX) injection  40 mg Subcutaneous Q24H  . lactulose  20 g Oral Daily  . levothyroxine  50 mcg Oral QAC breakfast  . nabumetone  500  mg Oral Q breakfast  . pantoprazole  40 mg Oral Daily  . sodium chloride flush  3 mL Intravenous Q12H    Assessment/Plan:  Echo and Carotid dopplers unremarkable. CT as noted above. Workup complete from neuro stand point. Continue ASA and statin. No neuro F/U recommended.   Neurology will sign off at this time. Please call if we can be of further service.   Delton See PA-C [facility] Pager [phone] 08/25/2016, 9:46 AM   Neurology Attending Addendum  This patient was seen, examined, and d/w PA. I have reviewed the note and agree with the findings, assessment and plan as documented with the following additions.   No significant 24-hour events apart from some intermittent agitation. Last night, he refused to get back into  bed after going to the bathroom and wanted to sit on the floor. He took off his gown and did not want to have it put back on. Telemetry had to be discontinued because pulling leads. He was allowed to stay in bed without his gown and seemed to do better once he was disconnected from telemetry. No new neurologic deficits have been reported. The patient is unable to participate with you systems due to his limited cognitive function.  Exam is grossly unremarkable. He doesn't have any obvious neurologic deficits as best I can tell. Exam is limited by poor cooperation.  Carotid Doppler showed no hemodynamically significant stenosis.  Transthoracic echocardiogram did not show any evidence of an embolic source.  Impression: 1. Acute ischemic stroke 2. Right hemiparesis 3. Encephalopathy, chronic 4. Down syndrome  Recommendations:  As per PA note. Workup unremarkable. At this point, patient is okay for discharge. Continue aspirin and statin. No additional recommendations at this time.

## 2016-08-25 NOTE — Care Management Note (Addendum)
Case Management Note  Patient Details  Name: NYKO GELL MRN: [id number] Date of Birth: Jul 29, 1955  Subjective/Objective:                    Action/Plan: Pt returning to his [facility]. Group home to provide transportation and time arranged is for 12 pm. CM called and spoke to the nursing department of the group home and inquired about [facility] services. They stated they provide theses services and need the orders faxed to: [phone]. Orders and d/c summary faxed per request.  Brothers current information updated in the system and CSW informed him of d/c. Bedside RN updated.  Addendum: OT also ordered at d/c. CM faxed these orders to his group home. Pt also with orders for a wheelchair. Jermaine with [facility] notified and will have it delivered to the room.  Pt not picked up by the group home by 2 pm. CM called and they state they will arrive at 2:30 to transport him.   Expected Discharge Date:  08/25/16               Expected Discharge Plan:  Group Home ([facility] group home)  In-House Referral:     Discharge planning Services  CM Consult  Post Acute Care Choice:  Home Health Choice offered to:   (facility)  DME Arranged:    DME Agency:     HH Arranged:  PT HH Agency:   (facility provides [facility] services)  Status of Service:  Completed, signed off  If discussed at Long Length of Stay Meetings, dates discussed:    Additional Comments:  Kermit Balo, RN 08/25/2016, 10:35 AM

## 2016-08-25 NOTE — Clinical Social Work Note (Signed)
Pt is ready for discharge today and will go back to [facility]. Pt is non-verbal and developmentally delayed. Brother is aware (via phone call) and agreeable to discharge plan.  RNCM spoke with group home rep to arrange home health. Transportation arranged with group home representative for 12pm pick up. RN aware of d/c plan. CSW is signing off as no further needs identified.  Corlis Hove, Theresia Majors, [facility] Clinical Social Worker  [phone]

## 2016-08-25 NOTE — Progress Notes (Signed)
Patient discharged back to group. Pt was picked up by group home van.

## 2016-08-25 NOTE — Progress Notes (Signed)
Physical Therapy Treatment Patient Details Name: Travis Hanna MRN: [id number] DOB: July 23, 1955 Today's Date: 08/25/2016    History of Present Illness 61 y.o. male with medical history of MR and Down's syndrome who is brought by a care taker from a group home. He was noted to start stumbling yesterday and dragging his right leg. CT head showed possible developing L periventricular infarct.     PT Comments    Pt performed increased activity but fatigues quickly.  Pt with balance deficits and multiple LOB during session.  PTA and OT performed co-treat to establish DME for d/c back to group home.  Pt will require a pediatric WC and HHPT at d/c.  Will inform supervising PT of needs for DME recommendations.  Pt to d/c to group home today around 12 pm.      Follow Up Recommendations  Home health PT;Supervision for mobility/OOB     Equipment Recommendations  Wheelchair cushion (measurements PT);Wheelchair (measurements PT) (pediatric WC for use of mobility until balance improves. )    Recommendations for Other Services       Precautions / Restrictions Precautions Precautions: Fall Restrictions Weight Bearing Restrictions: No    Mobility  Bed Mobility               General bed mobility comments: Pt standing at sink on arrival with OT.    Transfers Overall transfer level: Needs assistance Equipment used: 1 person hand held assist;Rolling walker (2 wheeled) (youth height) Transfers: Sit to/from Stand Sit to Stand: Min assist;Min guard         General transfer comment: Cues for hand placement,  Posterior lean and lateral lean noted on the R.    Ambulation/Gait Ambulation/Gait assistance: Mod assist (Initially min as patient fatigues he requires mod to max assist to correct LOB posterior and to the R.  ) Ambulation Distance (Feet): 12 Feet (+ 40 ft.  Encouraged patient to ambulate forward but he kept attempting to turn back to bed.  ) Assistive device: Rolling walker (2  wheeled) Gait Pattern/deviations: Step-through pattern;Leaning posteriorly;Ataxic;Scissoring;Decreased stance time - right;Decreased step length - right;Decreased dorsiflexion - right;Decreased weight shift to right   Gait velocity interpretation: Below normal speed for age/gender General Gait Details: Cues for increased step length and foot clearance on R patient with difficulty advancing step forward and turning R side of RW.     Stairs            Wheelchair Mobility    Modified Rankin (Stroke Patients Only)       Balance Overall balance assessment: Needs assistance   Sitting balance-Leahy Scale: Fair       Standing balance-Leahy Scale: Poor                              Cognition Arousal/Alertness: Lethargic Behavior During Therapy: WFL for tasks assessed/performed Overall Cognitive Status: History of cognitive impairments - at baseline                                        Exercises      General Comments        Pertinent Vitals/Pain Pain Assessment: Faces Pain Score: 0-No pain    Home Living                      Prior Function  PT Goals (current goals can now be found in the care plan section) Acute Rehab PT Goals Patient Stated Goal: pt mostly non-verbal Potential to Achieve Goals: Good Progress towards PT goals: Progressing toward goals    Frequency    Min 3X/week      PT Plan Discharge plan needs to be updated    Co-evaluation PT/OT/SLP Co-Evaluation/Treatment: Yes Reason for Co-Treatment: Necessary to address cognition/behavior during functional activity;To address functional/ADL transfers PT goals addressed during session: Mobility/safety with mobility;Balance;Proper use of DME OT goals addressed during session: Proper use of Adaptive equipment and DME;ADL's and self-care     End of Session Equipment Utilized During Treatment: Gait belt Activity Tolerance: Patient tolerated  treatment well Patient left: in bed;with call bell/phone within reach;with bed alarm set Nurse Communication: Mobility status (Informed about need for pediatric WC before d/c to group home) PT Visit Diagnosis: Difficulty in walking, not elsewhere classified (R26.2)     Time: 1610-9604 PT Time Calculation (min) (ACUTE ONLY): 32 min  Charges:  $Gait Training: 8-22 mins                    G Codes:       Joycelyn Rua, PTA pager [phone]    Florestine Avers 08/25/2016, 11:09 AM

## 2018-10-14 ENCOUNTER — Emergency Department (HOSPITAL_COMMUNITY): Payer: MEDICARE

## 2018-10-14 ENCOUNTER — Other Ambulatory Visit: Payer: Self-pay

## 2018-10-14 ENCOUNTER — Encounter (HOSPITAL_COMMUNITY): Payer: Self-pay

## 2018-10-14 ENCOUNTER — Emergency Department (HOSPITAL_COMMUNITY)
Admission: EM | Admit: 2018-10-14 | Discharge: 2018-10-14 | Disposition: A | Discharge status: Home / Self Care | Payer: MEDICARE | Attending: Emergency Medicine | Admitting: Emergency Medicine

## 2018-10-14 DIAGNOSIS — Y999 Unspecified external cause status: Secondary | ICD-10-CM | POA: Insufficient documentation

## 2018-10-14 DIAGNOSIS — W182XXA Fall in (into) shower or empty bathtub, initial encounter: Secondary | ICD-10-CM | POA: Insufficient documentation

## 2018-10-14 DIAGNOSIS — T148XXA Other injury of unspecified body region, initial encounter: Secondary | ICD-10-CM

## 2018-10-14 DIAGNOSIS — E039 Hypothyroidism, unspecified: Secondary | ICD-10-CM | POA: Diagnosis not present

## 2018-10-14 DIAGNOSIS — Y92002 Bathroom of unspecified non-institutional (private) residence single-family (private) house as the place of occurrence of the external cause: Secondary | ICD-10-CM | POA: Insufficient documentation

## 2018-10-14 DIAGNOSIS — Z79899 Other long term (current) drug therapy: Secondary | ICD-10-CM | POA: Insufficient documentation

## 2018-10-14 DIAGNOSIS — W19XXXA Unspecified fall, initial encounter: Secondary | ICD-10-CM

## 2018-10-14 DIAGNOSIS — Z7982 Long term (current) use of aspirin: Secondary | ICD-10-CM | POA: Insufficient documentation

## 2018-10-14 DIAGNOSIS — S0990XA Unspecified injury of head, initial encounter: Secondary | ICD-10-CM

## 2018-10-14 DIAGNOSIS — S0081XA Abrasion of other part of head, initial encounter: Secondary | ICD-10-CM | POA: Insufficient documentation

## 2018-10-14 DIAGNOSIS — Y93E1 Activity, personal bathing and showering: Secondary | ICD-10-CM | POA: Insufficient documentation

## 2018-10-14 NOTE — ED Notes (Signed)
Bed: WA17 Expected date:  Expected time:  Means of arrival:  Comments: EMS 62yo fall from group home- uncooperative

## 2018-10-14 NOTE — ED Provider Notes (Signed)
[facility]-EMERGENCY DEPT Provider Note   CSN: [id number] Arrival date & time: 10/14/18  0825    History   Chief Complaint Chief Complaint  Patient presents with   Fall    HPI Travis Hanna is a 63 y.o. male.     HPI   Patient is a 63 year old male with a history of Down syndrome, MR, myopia with astigmatism, osteoporosis, who presents to the emergency department today from group home for evaluation of a fall.  Per EMS, patient was receiving assistance to get a shower when he slipped and fell forward hitting his head and nose.  Unclear if there was LOC.  History is limited as patient is mostly nonverbal.  He can answer yes and no to some questions.  Complains of pain to the left hip, neck and back. Has bleeding from the nose.   8:55 AM contacted the facility and spoke with Victorino Dike 1 of the nurses, she states she has not received signout yet from the off going nurse and she does not have much information about what occurred with the patient's fall.  She suggested calling the on-call nurse Aram Beecham.  She does not think the patient is on blood thinners.  8:57 AM contacted Aram Beecham, nurse on call for [facility], who states that she was told patient was being assisted in getting a bath when he slipped and fell forward hitting his head and causing him to have a nosebleed.  He reportedly had no LOC. She does not have any further information.  Past Medical History:  Diagnosis Date   Anemia    Down syndrome    MR (mental retardation)    Myopia with astigmatism    Osteoporosis     Patient Active Problem List   Diagnosis Date Noted   CVA (cerebral vascular accident) (HCC) 08/25/2016   Right leg weakness 08/23/2016   Hypothyroidism 06/26/2013   Ileus (HCC) 06/25/2013   Abdominal pain, unspecified site 06/25/2013   Down syndrome 06/25/2013   Constipation 06/25/2013    History reviewed. No pertinent surgical history.      Home Medications    Prior  to Admission medications   Medication Sig Start Date End Date Taking? Authorizing Provider  alendronate (FOSAMAX) 70 MG tablet Take 70 mg by mouth every Friday. Take with a full glass of water on an empty stomach.    [provider]  aspirin EC 81 MG EC tablet Take 1 tablet (81 mg total) by mouth daily. 08/26/16   Leatha Gilding, MD  atorvastatin (LIPITOR) 40 MG tablet Take 1 tablet (40 mg total) by mouth daily at 6 PM. 08/25/16   Gherghe, Daylene Katayama, MD  Calcium Carbonate-Vitamin D 600-400 MG-UNIT tablet Take 1 tablet by mouth 2 (two) times daily.    [provider]  carBAMazepine (TEGRETOL) 100 MG/5ML suspension Take 200 mg by mouth 2 (two) times daily.     [provider]  cetaphil (CETAPHIL) lotion Apply 1 application topically See admin instructions. Apply topically to face daily    [provider]  Cholecalciferol (VITAMIN D3) 2000 units TABS Take 2,000 Units by mouth daily.    [provider]  enalapril (VASOTEC) 10 MG tablet Take 10 mg by mouth at bedtime.     [provider]  esomeprazole (NEXIUM) 40 MG capsule Take 40 mg by mouth at bedtime.    [provider]  ketoconazole (NIZORAL) 2 % shampoo Apply 1 application topically every Monday, Wednesday, and Friday.  [provider]  lactulose (CHRONULAC) 10 GM/15ML solution Take 13.3 g by mouth daily.     [provider]  levothyroxine (SYNTHROID) 50 MCG tablet Take 50 mcg by mouth daily before breakfast.     [provider]  lubiprostone (AMITIZA) 8 MCG capsule Take 8 mcg by mouth 2 (two) times daily with a meal.    [provider]  metroNIDAZOLE (METROGEL) 1 % gel Apply 1 application topically See admin instructions. Apply to face every evening for acne    [provider]  mometasone (ELOCON) 0.1 % cream Apply 1 application topically See admin instructions. Apply sparingly to nasal bridge every other day    [provider]    nabumetone (RELAFEN) 500 MG tablet Take 500 mg by mouth daily.  02/04/14   [provider]  polyethylene glycol (MIRALAX / GLYCOLAX) packet Take 17 g by mouth daily.     [provider]  polyvinyl alcohol (ARTIFICIAL TEARS) 1.4 % ophthalmic solution Place 1 drop into both eyes 3 (three) times daily.    [provider]    Family History No family history on file.  Social History Social History   Tobacco Use   Smoking status: Never Smoker   Smokeless tobacco: Never Used  Substance Use Topics   Alcohol use: No   Drug use: No     Allergies   Carbapenems; Cephalosporins; Other; Penicillamine; and Penicillins   Review of Systems Review of Systems  Reason unable to perform ROS: MR/Down's syndrome.  HENT: Positive for nosebleeds.   Musculoskeletal:       Left hip pain, neck pain, back pain  Neurological:       Head trauma     Physical Exam Updated Vital Signs BP (!) 158/90 (BP Location: Right Arm)    Pulse 66    Temp 98.3 F (36.8 C) (Axillary)    Resp 18    Ht 5' (1.524 m)    Wt 56.3 kg    SpO2 98%    BMI 24.24 kg/m   Physical Exam Vitals signs and nursing note reviewed.  Constitutional:      Appearance: He is well-developed.  HENT:     Head: Normocephalic.     Comments: Small superficial abrasion to the left forehead with no active bleeding.    Nose:     Comments: TTP to the nose. Dried blood to bilat nares, no nasal septal hematoma on the right. Exam limited by pt cooperation.  Eyes:     Conjunctiva/sclera: Conjunctivae normal.     Comments: Right pupil round and reactive. Left eye with strabismus. Pupil round and reactive.   Neck:     Musculoskeletal: Neck supple.  Cardiovascular:     Rate and Rhythm: Normal rate and regular rhythm.     Heart sounds: No murmur.  Pulmonary:     Effort: Pulmonary effort is normal. No respiratory distress.     Breath sounds: Normal breath sounds. No wheezing, rhonchi or rales.  Chest:     Chest  wall: No tenderness.  Abdominal:     General: Bowel sounds are normal.     Palpations: Abdomen is soft.     Tenderness: There is no abdominal tenderness. There is no guarding or rebound.  Musculoskeletal:     Comments: TTP to the cervical spine, mid thoracic and lumbar spine. TTP to the left hip. Otherwise no bony tenderness.   Skin:    General: Skin is warm and dry.  Neurological:  Mental Status: He is alert.     Comments: Alert. Moving all extremities.     ED Treatments / Results  Labs (all labs ordered are listed, but only abnormal results are displayed) Labs Reviewed - No data to display  EKG None  Radiology Dg Thoracic Spine 2 View  Result Date: 10/14/2018 CLINICAL DATA:  Pain following fall EXAM: THORACIC SPINE 3 VIEWS COMPARISON:  Chest radiograph August 23, 2016 FINDINGS: Frontal, lateral, and swimmer's views were obtained. There is thoracolumbar levoscoliosis with rotatory component, similar to prior chest radiograph. No evident fracture or spondylolisthesis. There is disc space narrowing at multiple levels in the lower thoracic spine as well as in the lower cervical spine. No erosive change or paraspinous lesions. Visualized lungs clear. IMPRESSION: Thoracolumbar levoscoliosis with rotatory component. Multilevel arthropathy in the lower cervical and lower thoracic regions. No fracture or spondylolisthesis. Electronically Signed   By: Bretta Bang III M.D.   On: 10/14/2018 09:32   Dg Lumbar Spine Complete  Result Date: 10/14/2018 CLINICAL DATA:  Pain following fall EXAM: LUMBAR SPINE - COMPLETE 4+ VIEW COMPARISON:  February 14, 2014. FINDINGS: Frontal, lateral, spot lumbosacral lateral, and bilateral oblique views were obtained. There are 5 non-rib-bearing lumbar type vertebral bodies. There is thoracolumbar levoscoliosis with rotatory component, essentially stable from prior study. There is no appreciable fracture. There is stable 4 mm of anterolisthesis of L4 on L5. No new  spondylolisthesis is evident. There is moderate disc space narrowing at all levels. There is facet osteoarthritic change at all levels bilaterally. IMPRESSION: Stable scoliosis. No fracture. Stable spondylolisthesis at L4-5. No other appreciable spondylolisthesis. Multilevel arthropathy, similar to prior study. Electronically Signed   By: Bretta Bang III M.D.   On: 10/14/2018 09:35   Ct Head Wo Contrast  Result Date: 10/14/2018 CLINICAL DATA:  Larey Seat and hit nose on bathtub. EXAM: CT HEAD WITHOUT CONTRAST CT MAXILLOFACIAL WITHOUT CONTRAST CT CERVICAL SPINE WITHOUT CONTRAST TECHNIQUE: Multidetector CT imaging of the head, cervical spine, and maxillofacial structures were performed using the standard protocol without intravenous contrast. Multiplanar CT image reconstructions of the cervical spine and maxillofacial structures were also generated. COMPARISON:  Head, maxillofacial, and cervical spine CT 11/28/2016 FINDINGS: CT HEAD FINDINGS Brain: There is no evidence of acute infarct, intracranial hemorrhage, midline shift, or extra-axial fluid collection. Chronic lacunar infarcts are again seen in the left caudate body and thalami. A 5 mm hyperdense mass in the third ventricle near the foramina of [place]ro is unchanged. Mild prominence of the ventricles is unchanged and likely secondary to cerebral atrophy. Cerebral white matter hypodensities are unchanged and nonspecific but compatible with mild chronic small vessel ischemic disease. Vascular: Calcified atherosclerosis at the skull base. No hyperdense vessel. Skull: No fracture or focal osseous lesion. Other: None. CT MAXILLOFACIAL FINDINGS Osseous: No acute fracture or mandibular dislocation. Orbits: No acute orbital finding. Sinuses: Mild mucosal thickening and bubbly material in the left sphenoid sinus. Rightward nasal septal deviation. Clear mastoid air cells. Soft tissues: Unremarkable. CT CERVICAL SPINE FINDINGS Alignment: Unchanged straightening of the  normal cervical lordosis. Unchanged grade 1 retrolisthesis of C3 on C4. Mildly increased grade 1 anterolisthesis of C7 on T1, likely degenerative. Skull base and vertebrae: No acute fracture or suspicious osseous lesion. C1-2 arthropathy, asymmetrically severe on the left. Unchanged mild basilar impression. Soft tissues and spinal canal: No prevertebral fluid or swelling. No visible canal hematoma. Disc levels: Diffuse cervical disc degeneration with severe disc space narrowing from C2-3-C6-7 and milder narrowing at C7-T1. Multilevel facet arthrosis,  severe on the right at C4-5 where there is facet ankylosis. 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 Moderate to severe right-sided neural foraminal stenosis at C3-4 and C4-5. Upper chest: Clear lung apices. Other: None. IMPRESSION: 1. No evidence of acute intracranial abnormality. 2. Unchanged 5 mm colloid cyst. 3. Mild chronic small vessel ischemic disease with chronic deep gray nuclei lacunar infarcts. 4. No maxillofacial fracture. 5. No evidence of acute cervical spine fracture. Advanced disc degeneration. Electronically Signed   By: Sebastian AcheAllen  Grady M.D.   On: 10/14/2018 10:04   Ct Cervical Spine Wo Contrast  Result Date: 10/14/2018 CLINICAL DATA:  Larey SeatFell and hit nose on bathtub. EXAM: CT HEAD WITHOUT CONTRAST CT MAXILLOFACIAL WITHOUT CONTRAST CT CERVICAL SPINE WITHOUT CONTRAST TECHNIQUE: Multidetector CT imaging of the head, cervical spine, and maxillofacial structures were performed using the standard protocol without intravenous contrast. Multiplanar CT image reconstructions of the cervical spine and maxillofacial structures were also generated. COMPARISON:  Head, maxillofacial, and cervical spine CT 11/28/2016 FINDINGS: CT HEAD FINDINGS Brain: There is no evidence of acute infarct, intracranial hemorrhage, midline shift, or extra-axial fluid collection. Chronic lacunar infarcts are again seen  in the left caudate body and thalami. A 5 mm hyperdense mass in the third ventricle near the foramina of [place] is unchanged. Mild prominence of the ventricles is unchanged and likely secondary to cerebral atrophy. Cerebral white matter hypodensities are unchanged and nonspecific but compatible with mild chronic small vessel ischemic disease. Vascular: Calcified atherosclerosis at the skull base. No hyperdense vessel. Skull: No fracture or focal osseous lesion. Other: None. CT MAXILLOFACIAL FINDINGS Osseous: No acute fracture or mandibular dislocation. Orbits: No acute orbital finding. Sinuses: Mild mucosal thickening and bubbly material in the left sphenoid sinus. Rightward nasal septal deviation. Clear mastoid air cells. Soft tissues: Unremarkable. CT CERVICAL SPINE FINDINGS Alignment: Unchanged straightening of the normal cervical lordosis. Unchanged grade 1 retrolisthesis of C3 on C4. Mildly increased grade 1 anterolisthesis of C7 on T1, likely degenerative. Skull base and vertebrae: No acute fracture or suspicious osseous lesion. C1-2 arthropathy, asymmetrically severe on the left. Unchanged mild basilar impression. Soft tissues and spinal canal: No prevertebral fluid or swelling. No visible canal hematoma. Disc levels: Diffuse cervical disc degeneration with severe disc space narrowing from C2-3-C6-7 and milder narrowing at C7-T1. Multilevel facet arthrosis, severe on the right at C4-5 where there is facet ankylosis. 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 Moderate to severe right-sided neural foraminal stenosis at C3-4 and C4-5. Upper chest: Clear lung apices. Other: None. IMPRESSION: 1. No evidence of acute intracranial abnormality. 2. Unchanged 5 mm colloid cyst. 3. Mild chronic small vessel ischemic disease with chronic deep gray nuclei lacunar infarcts. 4. No maxillofacial fracture. 5. No evidence of acute cervical spine fracture.  Advanced disc degeneration. Electronically Signed   By: Sebastian AcheAllen  Grady M.D.   On: 10/14/2018 10:04   Dg Hip Unilat W Or Wo Pelvis 2-3 Views Left  Result Date: 10/14/2018 CLINICAL DATA:  Pain following fall EXAM: DG HIP (WITH OR WITHOUT PELVIS) 2-3V LEFT COMPARISON:  November 28, 2016 FINDINGS: Frontal pelvis as well as frontal and lateral left hip images were obtained. No evident fracture or dislocation. There is mild symmetric narrowing of each hip joints. Sacroiliac joints appear within normal limits. No erosive change. There is lower lumbar levoscoliosis, stable. IMPRESSION: Mild symmetric narrowing of each hip joint, essentially stable. No fracture or  dislocation. Electronically Signed   By: Bretta BangWilliam  Woodruff III M.D.   On: 10/14/2018 09:36   Ct Maxillofacial Wo Contrast  Result Date: 10/14/2018 CLINICAL DATA:  Larey SeatFell and hit nose on bathtub. EXAM: CT HEAD WITHOUT CONTRAST CT MAXILLOFACIAL WITHOUT CONTRAST CT CERVICAL SPINE WITHOUT CONTRAST TECHNIQUE: Multidetector CT imaging of the head, cervical spine, and maxillofacial structures were performed using the standard protocol without intravenous contrast. Multiplanar CT image reconstructions of the cervical spine and maxillofacial structures were also generated. COMPARISON:  Head, maxillofacial, and cervical spine CT 11/28/2016 FINDINGS: CT HEAD FINDINGS Brain: There is no evidence of acute infarct, intracranial hemorrhage, midline shift, or extra-axial fluid collection. Chronic lacunar infarcts are again seen in the left caudate body and thalami. A 5 mm hyperdense mass in the third ventricle near the foramina of Monro is unchanged. Mild prominence of the ventricles is unchanged and likely secondary to cerebral atrophy. Cerebral white matter hypodensities are unchanged and nonspecific but compatible with mild chronic small vessel ischemic disease. Vascular: Calcified atherosclerosis at the skull base. No hyperdense vessel. Skull: No fracture or focal osseous  lesion. Other: None. CT MAXILLOFACIAL FINDINGS Osseous: No acute fracture or mandibular dislocation. Orbits: No acute orbital finding. Sinuses: Mild mucosal thickening and bubbly material in the left sphenoid sinus. Rightward nasal septal deviation. Clear mastoid air cells. Soft tissues: Unremarkable. CT CERVICAL SPINE FINDINGS Alignment: Unchanged straightening of the normal cervical lordosis. Unchanged grade 1 retrolisthesis of C3 on C4. Mildly increased grade 1 anterolisthesis of C7 on T1, likely degenerative. Skull base and vertebrae: No acute fracture or suspicious osseous lesion. C1-2 arthropathy, asymmetrically severe on the left. Unchanged mild basilar impression. Soft tissues and spinal canal: No prevertebral fluid or swelling. No visible canal hematoma. Disc levels: Diffuse cervical disc degeneration with severe disc space narrowing from C2-3-C6-7 and milder narrowing at C7-T1. Multilevel facet arthrosis, severe on the right at C4-5 where there is facet ankylosis. 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 Moderate to severe right-sided neural foraminal stenosis at C3-4 and C4-5. Upper chest: Clear lung apices. Other: None. IMPRESSION: 1. No evidence of acute intracranial abnormality. 2. Unchanged 5 mm colloid cyst. 3. Mild chronic small vessel ischemic disease with chronic deep gray nuclei lacunar infarcts. 4. No maxillofacial fracture. 5. No evidence of acute cervical spine fracture. Advanced disc degeneration. Electronically Signed   By: Sebastian AcheAllen  Grady M.D.   On: 10/14/2018 10:04    Procedures Procedures (including critical care time)  Medications Ordered in ED Medications - No data to display   Initial Impression / Assessment and Plan / ED Course  I have reviewed the triage vital signs and the nursing notes.  Pertinent labs & imaging results that were available during my care of the patient were reviewed by me and  considered in my medical decision making (see chart for details).     Final Clinical Impressions(s) / ED Diagnoses   Final diagnoses:  Fall, initial encounter  Minor head injury, initial encounter  Abrasion   Patient is a 63 year old male with a history of Down syndrome, MR,  osteoporosis, who presents to the emergency department today from group home for evaluation of a mechanical fall.  Per EMS, patient was receiving assistance to get a shower when he slipped and fell forward hitting his head and nose.  No LOC.   History is limited as patient is mostly nonverbal. Has some midline ttp to the cervical, mid thoracic, and lumbar spine. TTP  to the left hip as well.   CT head without acute intracranial abnormality.  Chronic small vessel ischemic disease and chronic infarcts.  Unchanged 5 mm colloid cyst. CT cervical spine without evidence of acute C-spine fracture.  Degenerative changes noted that are stable. CT maxillofacial with rightward nasal septal deviation but no nasal bone fracture or any evidence of facial fracture.  X-ray with pelvis and left hip negative for fracture, evidence of mild symmetric narrowing of each hip joint. X-ray lumbar spine with no evidence of fracture X-ray with thoracolumbar level scoliosis the rotary component without evidence of acute fracture or spondylolisthesis.   patient able to ambulate within the room with assistance which is his baseline.  He is stable for discharge.  I called the facility inform them of patient's discharge.  They are aware.  ED Discharge Orders    None       Rayne DuCouture, Kaliel Bolds S, PA-C 10/14/18 1532    Azalia Bilisampos, Kevin, MD 10/15/18 [phone]

## 2018-10-14 NOTE — ED Notes (Signed)
Cleaned dried blood off of hands, face. Attempted to get dried blood out of nostrils, pt began swatting and saying "No!"

## 2018-10-14 NOTE — Discharge Instructions (Addendum)
We did a CT scan of your head, face, and neck and none of them showed any fractures or any bleeding in your head.  We did an x-ray of your thoracic and lumbar spine and those were negative for any acute fracture.  We did an x-ray of your pelvis and left hip and that was negative for any fracture.  Please have the patient follow-up with his primary care physician in 1 week for reevaluation return to the ER for new or worsening symptoms.

## 2018-10-14 NOTE — ED Notes (Signed)
Pt able to bear weight on both legs, and able to take several steps w/o pain

## 2018-10-14 NOTE — ED Notes (Signed)
Report called to [facility] group [facility], they state that they will arrange for transportation ASAP

## 2018-10-14 NOTE — ED Notes (Signed)
Pt provided with graham crackers and ice water

## 2018-10-14 NOTE — ED Triage Notes (Signed)
Pt arrives [facility] from a group home after falling and hitting his nose on the side of the bathtub. Pt is bleeding from the nose, and has a small bump on his left forehead.

## 2018-11-07 ENCOUNTER — Emergency Department (HOSPITAL_COMMUNITY)
Admission: EM | Admit: 2018-11-07 | Discharge: 2018-11-07 | Disposition: A | Discharge status: Home / Self Care | Payer: MEDICARE | Attending: Emergency Medicine | Admitting: Emergency Medicine

## 2018-11-07 ENCOUNTER — Encounter (HOSPITAL_COMMUNITY): Payer: Self-pay | Admitting: *Deleted

## 2018-11-07 ENCOUNTER — Other Ambulatory Visit: Payer: Self-pay

## 2018-11-07 DIAGNOSIS — Z7982 Long term (current) use of aspirin: Secondary | ICD-10-CM | POA: Diagnosis not present

## 2018-11-07 DIAGNOSIS — R569 Unspecified convulsions: Secondary | ICD-10-CM | POA: Diagnosis present

## 2018-11-07 DIAGNOSIS — E039 Hypothyroidism, unspecified: Secondary | ICD-10-CM | POA: Insufficient documentation

## 2018-11-07 DIAGNOSIS — Q909 Down syndrome, unspecified: Secondary | ICD-10-CM | POA: Insufficient documentation

## 2018-11-07 DIAGNOSIS — Z79899 Other long term (current) drug therapy: Secondary | ICD-10-CM | POA: Insufficient documentation

## 2018-11-07 LAB — COMPREHENSIVE METABOLIC PANEL
ALT: 16 U/L (ref 0–44)
AST: 24 U/L (ref 15–41)
Albumin: 3.4 g/dL — ABNORMAL LOW (ref 3.5–5.0)
Alkaline Phosphatase: 77 U/L (ref 38–126)
Anion gap: 11 (ref 5–15)
BUN: 15 mg/dL (ref 6–20)
CO2: 24 mmol/L (ref 22–32)
Calcium: 8.7 mg/dL — ABNORMAL LOW (ref 8.9–10.3)
Chloride: 104 mmol/L (ref 98–111)
Creatinine, Ser: 1.05 mg/dL (ref 0.61–1.24)
GFR calc Af Amer: >60 mL/min (ref >60)
GFR calc non Af Amer: >60 mL/min (ref >60)
Glucose, Bld: 106 mg/dL — ABNORMAL HIGH (ref 70–99)
Potassium: 4 mmol/L (ref 3.5–5.1)
Sodium: 139 mmol/L (ref 135–145)
Total Bilirubin: 0.4 mg/dL (ref 0.3–1.2)
Total Protein: 7 g/dL (ref 6.5–8.1)

## 2018-11-07 LAB — CBC WITH DIFFERENTIAL/PLATELET
Abs Immature Granulocytes: 0 10*3/uL (ref 0.00–0.07)
Band Neutrophils: 22 %
Basophils Absolute: 0.1 10*3/uL (ref 0.0–0.1)
Basophils Relative: 1 %
Eosinophils Absolute: 0 10*3/uL (ref 0.0–0.5)
Eosinophils Relative: 0 %
HCT: 42.9 % (ref 39.0–52.0)
Hemoglobin: 14.1 g/dL (ref 13.0–17.0)
Lymphocytes Relative: 15 %
Lymphs Abs: 0.8 10*3/uL (ref 0.7–4.0)
MCH: 37 pg — ABNORMAL HIGH (ref 26.0–34.0)
MCHC: 32.9 g/dL (ref 30.0–36.0)
MCV: 112.6 fL — ABNORMAL HIGH (ref 80.0–100.0)
Monocytes Absolute: 0.4 10*3/uL (ref 0.1–1.0)
Monocytes Relative: 8 %
Neutro Abs: 3.8 10*3/uL (ref 1.7–7.7)
Neutrophils Relative %: 54 %
Platelets: 196 10*3/uL (ref 150–400)
RBC: 3.81 MIL/uL — ABNORMAL LOW (ref 4.22–5.81)
RDW: 13.1 % (ref 11.5–15.5)
WBC: 5 10*3/uL (ref 4.0–10.5)
nRBC: 0 % (ref 0.0–0.2)
nRBC: 1 /100 WBC — ABNORMAL HIGH

## 2018-11-07 LAB — CARBAMAZEPINE LEVEL, TOTAL: Carbamazepine Lvl: <2 ug/mL — ABNORMAL LOW (ref 4.0–12.0)

## 2018-11-07 MED ORDER — SODIUM CHLORIDE 0.9 % IV SOLN
INTRAVENOUS | Status: DC
Start: 2018-11-07 — End: 2018-11-08
  Administered 2018-11-07: 1000 mL via INTRAVENOUS

## 2018-11-07 MED ORDER — CARBAMAZEPINE 100 MG/5ML PO SUSP
400.0000 mg | Freq: Once | ORAL | Status: AC
  Administered 2018-11-07: 400 mg via ORAL
  Filled 2018-11-07: qty 20

## 2018-11-07 NOTE — ED Notes (Signed)
Patient is much calmer, laying on stretcher , will smile when you speak to him.

## 2018-11-07 NOTE — ED Triage Notes (Signed)
Patient presents to ed via [facility] states patient was sitting on the couch and slumped over patient had dried blood on his mouth and had biten his tongue. Per ems patient is back at baseline.

## 2018-11-07 NOTE — ED Provider Notes (Signed)
[facility] EM[facility]ENC[facility] DEPARTMENT Provider Note   CSN: [id number] Arrival date & time: 11/07/18  1152     History   Chief Complaint No chief complaint on file.   HPI Travis Hanna is a 63 y.o. male.     This is a 63 year old male with history of Down syndrome as well as mental retardation along with seizure disorder who presents after having possible seizure.  Patient was at the group home and patient was noted to have slumped over.  Some blood coming from his mouth was noted.  No actual seizure activity noted.  Patient is now back to his baseline.  Blood sugar noted to be above 100.  EMS called and patient transported here     Past Medical History:  Diagnosis Date  . Anemia   . Down syndrome   . MR (mental retardation)   . Myopia with astigmatism   . Osteoporosis     Patient Active Problem List   Diagnosis Date Noted  . CVA (cerebral vascular accident) ([place]) 08/25/2016  . Right leg weakness 08/23/2016  . Hypothyroidism 06/26/2013  . Ileus (IXL) 06/25/2013  . Abdominal pain, unspecified site 06/25/2013  . Down syndrome 06/25/2013  . Constipation 06/25/2013    No past surgical history on file.      Home Medications    Prior to Admission medications   Medication Sig Start Date End Date Taking? Authorizing Provider  alendronate (FOSAMAX) 70 MG tablet Take 70 mg by mouth every Friday. Take with a full glass of water on an empty stomach.    [provider]  aspirin EC 81 MG EC tablet Take 1 tablet (81 mg total) by mouth daily. 08/26/16   Caren Griffins, MD  atorvastatin (LIPITOR) 40 MG tablet Take 1 tablet (40 mg total) by mouth daily at 6 PM. 08/25/16   Gherghe, Vella Redhead, MD  Calcium Carbonate-Vitamin D 600-400 MG-UNIT tablet Take 1 tablet by mouth 2 (two) times daily.    [provider]  carBAMazepine (TEGRETOL) 100 MG/5ML suspension Take 200 mg by mouth 2 (two) times daily.     [provider]  cetaphil (CETAPHIL)  lotion Apply 1 application topically See admin instructions. Apply topically to face daily    [provider]  Cholecalciferol (VITAMIN D3) 2000 units TABS Take 2,000 Units by mouth daily.    [provider]  enalapril (VASOTEC) 10 MG tablet Take 10 mg by mouth at bedtime.     [provider]  esomeprazole (NEXIUM) 40 MG capsule Take 40 mg by mouth at bedtime.    [provider]  ketoconazole (NIZORAL) 2 % shampoo Apply 1 application topically every Monday, Wednesday, and Friday.     [provider]  lactulose (CHRONULAC) 10 GM/15ML solution Take 13.3 g by mouth daily.     [provider]  levothyroxine (SYNTHROID) 50 MCG tablet Take 50 mcg by mouth daily before breakfast.     [provider]  lubiprostone (AMITIZA) 8 MCG capsule Take 8 mcg by mouth 2 (two) times daily with a meal.    [provider]  metroNIDAZOLE (METROGEL) 1 % gel Apply 1 application topically See admin instructions. Apply to face every evening for acne    [provider]  mometasone (ELOCON) 0.1 % cream Apply 1 application topically See admin instructions. Apply sparingly to nasal bridge every other day    [provider]  nabumetone (RELAFEN) 500 MG tablet Take 500 mg by mouth daily.  02/04/14   [provider]  polyethylene glycol (MIRALAX / GLYCOLAX) packet Take 17 g by mouth daily.     [provider]  polyvinyl alcohol (ARTIFICIAL TEARS) 1.4 % ophthalmic solution Place 1 drop into both eyes 3 (three) times daily.    [provider]    Family History No family history on file.  Social History Social History   Tobacco Use  . Smoking status: Never Smoker  . Smokeless tobacco: Never Used  Substance Use Topics  . Alcohol use: No  . Drug use: No     Allergies   Carbapenems, Cephalosporins, Other, Penicillamine, and Penicillins   Review of Systems Review of Systems  Unable to perform ROS: Other      Physical Exam Updated Vital Signs There were no vitals taken for this visit.  Physical Exam Vitals signs and nursing note reviewed.  Constitutional:      General: He is not in acute distress.    Appearance: Normal appearance. He is well-developed. He is not toxic-appearing.  HENT:     Head: Normocephalic and atraumatic.  Eyes:     General: Lids are normal.     Conjunctiva/sclera: Conjunctivae normal.     Pupils: Pupils are equal, round, and reactive to light.  Neck:     Musculoskeletal: Normal range of motion and neck supple.     Thyroid: No thyroid mass.     Trachea: No tracheal deviation.  Cardiovascular:     Rate and Rhythm: Normal rate and regular rhythm.     Heart sounds: Normal heart sounds. No murmur. No gallop.   Pulmonary:     Effort: Pulmonary effort is normal. No respiratory distress.     Breath sounds: Normal breath sounds. No stridor. No decreased breath sounds, wheezing, rhonchi or rales.  Abdominal:     General: Bowel sounds are normal. There is no distension.     Palpations: Abdomen is soft.     Tenderness: There is no abdominal tenderness. There is no rebound.  Musculoskeletal: Normal range of motion.        General: No tenderness.  Skin:    General: Skin is warm and dry.     Findings: No abrasion or rash.  Neurological:     General: No focal deficit present.     Mental Status: He is alert. Mental status is at baseline.     GCS: GCS eye subscore is 4. GCS verbal subscore is 3. GCS motor subscore is 5.     Cranial Nerves: No cranial nerve deficit.     Sensory: No sensory deficit.     Comments: Patient moves all 4 extremities at this time.  Psychiatric:        Mood and Affect: Mood is anxious.      ED Treatments / Results  Labs (all labs ordered are listed, but only abnormal results are displayed) Labs Reviewed  CBC WITH DIFFERENTIAL/PLATELET  COMPREHENSIVE METABOLIC PANEL  CARBAMAZEPINE LEVEL, TOTAL    EKG None  Radiology No results  found.  Procedures Procedures (including critical care time)  Medications Ordered in ED Medications  0.9 %  sodium chloride infusion (has no administration in time range)     Initial Impression / Assessment and Plan / ED Course  I have reviewed the triage vital signs and the nursing notes.  Pertinent labs & imaging results that were available during my care of the patient were reviewed by me and considered in my medical decision making (see chart for details).  Patient given oral dose of Tegretol here.  No seizure activity here.  Stable for discharge   Final Clinical Impressions(s) / ED Diagnoses   Final diagnoses:  None    ED Discharge Orders    None       Lorre NickAllen, Jamaury Gumz, MD 11/07/18 1506

## 2018-11-09 ENCOUNTER — Other Ambulatory Visit: Payer: Self-pay

## 2018-11-09 ENCOUNTER — Encounter (HOSPITAL_COMMUNITY): Payer: Self-pay | Admitting: Emergency Medicine

## 2018-11-09 ENCOUNTER — Emergency Department (HOSPITAL_COMMUNITY)
Admission: EM | Admit: 2018-11-09 | Discharge: 2018-11-10 | Disposition: A | Discharge status: Home / Self Care | Payer: MEDICARE | Attending: Emergency Medicine | Admitting: Emergency Medicine

## 2018-11-09 DIAGNOSIS — Z7982 Long term (current) use of aspirin: Secondary | ICD-10-CM | POA: Diagnosis not present

## 2018-11-09 DIAGNOSIS — Q909 Down syndrome, unspecified: Secondary | ICD-10-CM | POA: Diagnosis not present

## 2018-11-09 DIAGNOSIS — E039 Hypothyroidism, unspecified: Secondary | ICD-10-CM | POA: Insufficient documentation

## 2018-11-09 DIAGNOSIS — R7889 Finding of other specified substances, not normally found in blood: Secondary | ICD-10-CM | POA: Diagnosis not present

## 2018-11-09 DIAGNOSIS — Z79899 Other long term (current) drug therapy: Secondary | ICD-10-CM | POA: Diagnosis not present

## 2018-11-09 DIAGNOSIS — R4182 Altered mental status, unspecified: Secondary | ICD-10-CM | POA: Diagnosis present

## 2018-11-09 LAB — CBG MONITORING, ED: Glucose-Capillary: 103 mg/dL — ABNORMAL HIGH (ref 70–99)

## 2018-11-09 NOTE — ED Notes (Signed)
Bed: WA07 Expected date:  Expected time:  Means of arrival:  Comments: EMS 63 yo male from group home-hx seizures-increasing weakness

## 2018-11-09 NOTE — ED Triage Notes (Signed)
Patient presents today with altered mental status and decreased mobility per facility. Normally he is combative when moved, but hasn't been since Thursday. They have noticed this alteration since his seizure on Thursday. The facility is concerned that the patient may have had a stroke on Thursday.   EMS vitals and CBG: 115/62 BP 80 HR 97% O2 sats on 2L, 94% O2 sats on room air 117 CBG 97.9 Temp

## 2018-11-10 ENCOUNTER — Emergency Department (HOSPITAL_COMMUNITY): Payer: MEDICARE

## 2018-11-10 DIAGNOSIS — Q909 Down syndrome, unspecified: Secondary | ICD-10-CM | POA: Diagnosis not present

## 2018-11-10 LAB — BASIC METABOLIC PANEL
Anion gap: 9 (ref 5–15)
BUN: 18 mg/dL (ref 6–20)
CO2: 29 mmol/L (ref 22–32)
Calcium: 9 mg/dL (ref 8.9–10.3)
Chloride: 103 mmol/L (ref 98–111)
Creatinine, Ser: 0.96 mg/dL (ref 0.61–1.24)
GFR calc Af Amer: >60 mL/min (ref >60)
GFR calc non Af Amer: >60 mL/min (ref >60)
Glucose, Bld: 111 mg/dL — ABNORMAL HIGH (ref 70–99)
Potassium: 4.1 mmol/L (ref 3.5–5.1)
Sodium: 141 mmol/L (ref 135–145)

## 2018-11-10 LAB — CBC WITH DIFFERENTIAL/PLATELET
Abs Immature Granulocytes: 0.06 10*3/uL (ref 0.00–0.07)
Basophils Absolute: 0 10*3/uL (ref 0.0–0.1)
Basophils Relative: 0 %
Eosinophils Absolute: 0 10*3/uL (ref 0.0–0.5)
Eosinophils Relative: 0 %
HCT: 42.1 % (ref 39.0–52.0)
Hemoglobin: 13.8 g/dL (ref 13.0–17.0)
Immature Granulocytes: 1 %
Lymphocytes Relative: 5 %
Lymphs Abs: 0.5 10*3/uL — ABNORMAL LOW (ref 0.7–4.0)
MCH: 37 pg — ABNORMAL HIGH (ref 26.0–34.0)
MCHC: 32.8 g/dL (ref 30.0–36.0)
MCV: 112.9 fL — ABNORMAL HIGH (ref 80.0–100.0)
Monocytes Absolute: 0.9 10*3/uL (ref 0.1–1.0)
Monocytes Relative: 8 %
Neutro Abs: 10.5 10*3/uL — ABNORMAL HIGH (ref 1.7–7.7)
Neutrophils Relative %: 86 %
Platelets: 183 10*3/uL (ref 150–400)
RBC: 3.73 MIL/uL — ABNORMAL LOW (ref 4.22–5.81)
RDW: 13.6 % (ref 11.5–15.5)
WBC: 12.1 10*3/uL — ABNORMAL HIGH (ref 4.0–10.5)
nRBC: 0 % (ref 0.0–0.2)

## 2018-11-10 LAB — URINALYSIS, ROUTINE W REFLEX MICROSCOPIC
Bilirubin Urine: NEGATIVE
Glucose, UA: NEGATIVE mg/dL
Hgb urine dipstick: NEGATIVE
Ketones, ur: 5 mg/dL — AB
Leukocytes,Ua: NEGATIVE
Nitrite: NEGATIVE
Protein, ur: NEGATIVE mg/dL
Specific Gravity, Urine: 1.021 (ref 1.005–1.030)
pH: 5 (ref 5.0–8.0)

## 2018-11-10 LAB — RAPID URINE DRUG SCREEN, HOSP PERFORMED
Amphetamines: NOT DETECTED
Barbiturates: NOT DETECTED
Benzodiazepines: NOT DETECTED
Cocaine: NOT DETECTED
Opiates: NOT DETECTED
Tetrahydrocannabinol: NOT DETECTED

## 2018-11-10 LAB — CARBAMAZEPINE LEVEL, TOTAL: Carbamazepine Lvl: 23.5 ug/mL (ref 4.0–12.0)

## 2018-11-10 NOTE — ED Notes (Signed)
Spoke with pt care manager from [facility], [place], and updated her to pt status and condition. This nurse gave instructions to care manager that were recommended from poison control and [place] MD to stop Tegretol immediately do to critical tegretol levels. Pt care manager verbalized understanding of instructions and is coming to pick pt up.

## 2018-11-10 NOTE — ED Notes (Signed)
While attempting to draw blood pt was physically combative and resisted procedure from this nurse and tech who was assisting. A third person was needed to hold pt while blood was draw due to pt physically combative and resistant behavior.

## 2018-11-10 NOTE — ED Provider Notes (Addendum)
[facility]-EMERGENCY DEPT Provider Note   CSN: [id number] Arrival date & time: 11/09/18  2240     History   Chief Complaint Chief Complaint  Patient presents with   Altered Mental Status    HPI Ronnell GuadalajaraCalvin B Timm is a 63 y.o. male.     The history is provided by the EMS personnel and medical records. The history is limited by the condition of the patient.  Altered Mental Status Presenting symptoms comment:  Is not being combative at the nursing home Severity:  Mild Most recent episode:  2 days ago Episode history:  Continuous Timing:  Constant Progression:  Unchanged Chronicity:  New Context: not alcohol use, not head injury, not recent illness and not recent infection   Associated symptoms: no abdominal pain, no difficulty breathing and no fever   Not being combative when moved since Thursday.    Past Medical History:  Diagnosis Date   Anemia    Down syndrome    MR (mental retardation)    Myopia with astigmatism    Osteoporosis     Patient Active Problem List   Diagnosis Date Noted   CVA (cerebral vascular accident) (HCC) 08/25/2016   Right leg weakness 08/23/2016   Hypothyroidism 06/26/2013   Ileus (HCC) 06/25/2013   Abdominal pain, unspecified site 06/25/2013   Down syndrome 06/25/2013   Constipation 06/25/2013    History reviewed. No pertinent surgical history.      Home Medications    Prior to Admission medications   Medication Sig Start Date End Date Taking? Authorizing Provider  alendronate (FOSAMAX) 70 MG tablet Take 70 mg by mouth every Friday. Take with a full glass of water on an empty stomach.   Yes [provider]  aspirin EC 81 MG EC tablet Take 1 tablet (81 mg total) by mouth daily. 08/26/16  Yes Leatha GildingGherghe, Costin M, MD  Calcium Carbonate-Vitamin D 600-400 MG-UNIT tablet Take 1 tablet by mouth 2 (two) times daily.   Yes [provider]  carBAMazepine (TEGRETOL) 100 MG/5ML suspension Take 200  mg by mouth 2 (two) times daily.    Yes [provider]  cetaphil (CETAPHIL) lotion Apply 1 application topically daily. Apply topically to face daily    Yes [provider]  Cholecalciferol (VITAMIN D3) 2000 units TABS Take 2,000 Units by mouth daily.   Yes [provider]  enalapril (VASOTEC) 10 MG tablet Take 10 mg by mouth at bedtime.    Yes [provider]  esomeprazole (NEXIUM) 40 MG capsule Take 40 mg by mouth at bedtime.   Yes [provider]  ketoconazole (NIZORAL) 2 % shampoo Apply 1 application topically every Monday, Wednesday, and Friday.    Yes [provider]  lactulose (CHRONULAC) 10 GM/15ML solution Take 10 g by mouth daily.    Yes [provider]  levothyroxine (SYNTHROID) 50 MCG tablet Take 50 mcg by mouth daily before breakfast.    Yes [provider]  lubiprostone (AMITIZA) 24 MCG capsule Take 24 mcg by mouth daily with breakfast.   Yes [provider]  metroNIDAZOLE (METROGEL) 1 % gel Apply 1 application topically See admin instructions. Apply to face every evening for acne   Yes [provider]  mometasone (ELOCON) 0.1 % cream Apply 1 application topically every other day. Apply sparingly to nasal bridge every other day   Yes [provider]  polyvinyl alcohol (ARTIFICIAL TEARS) 1.4 % ophthalmic solution Place 1 drop into both eyes 3 (three) times daily.  Yes [provider]  atorvastatin (LIPITOR) 40 MG tablet Take 1 tablet (40 mg total) by mouth daily at 6 PM. Patient not taking: Reported on 11/09/2018 08/25/16   Caren Griffins, MD    Family History History reviewed. No pertinent family history.  Social History Social History   Tobacco Use   Smoking status: Never Smoker   Smokeless tobacco: Never Used  Substance Use Topics   Alcohol use: No   Drug use: No     Allergies   Carbapenems, Cephalosporins, Other, Penicillamine, and Penicillins   Review  of Systems Review of Systems  Unable to perform ROS: Mental status change  Constitutional: Negative for fever.  Respiratory: Negative for cough.   Gastrointestinal: Negative for abdominal pain.     Physical Exam Updated Vital Signs BP 129/78 (BP Location: Left Arm)    Pulse 68    Temp 98.2 F (36.8 C) (Axillary)    Resp 18    Ht 5' (1.524 m)    Wt 56.3 kg    SpO2 96%    BMI 24.24 kg/m   Physical Exam Vitals signs and nursing note reviewed.  Constitutional:      General: He is not in acute distress.    Appearance: He is normal weight.  HENT:     Head: Normocephalic and atraumatic.     Nose: Nose normal.  Eyes:     Conjunctiva/sclera: Conjunctivae normal.     Pupils: Pupils are equal, round, and reactive to light.  Neck:     Musculoskeletal: Normal range of motion and neck supple.  Cardiovascular:     Rate and Rhythm: Normal rate and regular rhythm.     Pulses: Normal pulses.     Heart sounds: Normal heart sounds.  Pulmonary:     Effort: Pulmonary effort is normal.     Breath sounds: Normal breath sounds.  Abdominal:     General: Abdomen is flat. Bowel sounds are normal.     Tenderness: There is no abdominal tenderness. There is no guarding.  Musculoskeletal: Normal range of motion.  Skin:    General: Skin is warm and dry.     Capillary Refill: Capillary refill takes less than 2 seconds.  Neurological:     General: No focal deficit present.  Psychiatric:     Comments: Sleeping but arouses      ED Treatments / Results  Labs (all labs ordered are listed, but only abnormal results are displayed) Results for orders placed or performed during the hospital encounter of 11/09/18  CBC with Differential/Platelet  Result Value Ref Range   WBC 12.1 (H) 4.0 - 10.5 K/uL   RBC 3.73 (L) 4.22 - 5.81 MIL/uL   Hemoglobin 13.8 13.0 - 17.0 g/dL   HCT 42.1 39.0 - 52.0 %   MCV 112.9 (H) 80.0 - 100.0 fL   MCH 37.0 (H) 26.0 - 34.0 pg   MCHC 32.8 30.0 - 36.0 g/dL   RDW 13.6 11.5 -  15.5 %   Platelets 183 150 - 400 K/uL   nRBC 0.0 0.0 - 0.2 %   Neutrophils Relative % 86 %   Neutro Abs 10.5 (H) 1.7 - 7.7 K/uL   Lymphocytes Relative 5 %   Lymphs Abs 0.5 (L) 0.7 - 4.0 K/uL   Monocytes Relative 8 %   Monocytes Absolute 0.9 0.1 - 1.0 K/uL   Eosinophils Relative 0 %   Eosinophils Absolute 0.0 0.0 - 0.5 K/uL   Basophils Relative 0 %   Basophils  Absolute 0.0 0.0 - 0.1 K/uL   WBC Morphology MORPHOLOGY UNREMARKABLE    Immature Granulocytes 1 %   Abs Immature Granulocytes 0.06 0.00 - 0.07 K/uL  Basic metabolic panel  Result Value Ref Range   Sodium 141 135 - 145 mmol/L   Potassium 4.1 3.5 - 5.1 mmol/L   Chloride 103 98 - 111 mmol/L   CO2 29 22 - 32 mmol/L   Glucose, Bld 111 (H) 70 - 99 mg/dL   BUN 18 6 - 20 mg/dL   Creatinine, Ser 1.61 0.61 - 1.24 mg/dL   Calcium 9.0 8.9 - 09.6 mg/dL   GFR calc non Af Amer >60 >60 mL/min   GFR calc Af Amer >60 >60 mL/min   Anion gap 9 5 - 15  Urinalysis, Routine w reflex microscopic  Result Value Ref Range   Color, Urine YELLOW YELLOW   APPearance CLEAR CLEAR   Specific Gravity, Urine 1.021 1.005 - 1.030   pH 5.0 5.0 - 8.0   Glucose, UA NEGATIVE NEGATIVE mg/dL   Hgb urine dipstick NEGATIVE NEGATIVE   Bilirubin Urine NEGATIVE NEGATIVE   Ketones, ur 5 (A) NEGATIVE mg/dL   Protein, ur NEGATIVE NEGATIVE mg/dL   Nitrite NEGATIVE NEGATIVE   Leukocytes,Ua NEGATIVE NEGATIVE  Rapid urine drug screen (hospital performed)  Result Value Ref Range   Opiates NONE DETECTED NONE DETECTED   Cocaine NONE DETECTED NONE DETECTED   Benzodiazepines NONE DETECTED NONE DETECTED   Amphetamines NONE DETECTED NONE DETECTED   Tetrahydrocannabinol NONE DETECTED NONE DETECTED   Barbiturates NONE DETECTED NONE DETECTED  POC CBG, ED  Result Value Ref Range   Glucose-Capillary 103 (H) 70 - 99 mg/dL   Dg Thoracic Spine 2 View  Result Date: 10/14/2018 CLINICAL DATA:  Pain following fall EXAM: THORACIC SPINE 3 VIEWS COMPARISON:  Chest radiograph Daelon Dunivan  18, 2018 FINDINGS: Frontal, lateral, and swimmer's views were obtained. There is thoracolumbar levoscoliosis with rotatory component, similar to prior chest radiograph. No evident fracture or spondylolisthesis. There is disc space narrowing at multiple levels in the lower thoracic spine as well as in the lower cervical spine. No erosive change or paraspinous lesions. Visualized lungs clear. IMPRESSION: Thoracolumbar levoscoliosis with rotatory component. Multilevel arthropathy in the lower cervical and lower thoracic regions. No fracture or spondylolisthesis. Electronically Signed   By: Bretta Bang III M.D.   On: 10/14/2018 09:32   Dg Lumbar Spine Complete  Result Date: 10/14/2018 CLINICAL DATA:  Pain following fall EXAM: LUMBAR SPINE - COMPLETE 4+ VIEW COMPARISON:  February 14, 2014. FINDINGS: Frontal, lateral, spot lumbosacral lateral, and bilateral oblique views were obtained. There are 5 non-rib-bearing lumbar type vertebral bodies. There is thoracolumbar levoscoliosis with rotatory component, essentially stable from prior study. There is no appreciable fracture. There is stable 4 mm of anterolisthesis of L4 on L5. No new spondylolisthesis is evident. There is moderate disc space narrowing at all levels. There is facet osteoarthritic change at all levels bilaterally. IMPRESSION: Stable scoliosis. No fracture. Stable spondylolisthesis at L4-5. No other appreciable spondylolisthesis. Multilevel arthropathy, similar to prior study. Electronically Signed   By: Bretta Bang III M.D.   On: 10/14/2018 09:35   Ct Head Wo Contrast  Result Date: 11/10/2018 CLINICAL DATA:  Altered mental status. EXAM: CT HEAD WITHOUT CONTRAST TECHNIQUE: Contiguous axial images were obtained from the base of the skull through the vertex without intravenous contrast. COMPARISON:  Head CT 10/14/2018 FINDINGS: Brain: No intracranial hemorrhage, mass effect, or midline shift. No hydrocephalus. Stable degree of atrophy  and mild  chronic small vessel ischemia. 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 Unchanged remote bilateral basal ganglia infarcts. The basilar cisterns are patent. No evidence of territorial infarct or acute ischemia. No extra-axial or intracranial fluid collection. Vascular: Mild skull base atherosclerosis. No hyperdense vessel. Skull: No fracture or focal lesion. Sinuses/Orbits: Dysconjugate gaze, typically incidental. Fall frothy secretions in the sphenoid sinus, unchanged. Other: None. IMPRESSION: 1. No acute intracranial abnormality. 2. Unchanged 5 mm colloid cyst. No hydrocephalus. Electronically Signed   By: Narda RutherfordMelanie  Sanford M.D.   On: 11/10/2018 02:30   Ct Head Wo Contrast  Result Date: 10/14/2018 CLINICAL DATA:  Larey SeatFell and hit nose on bathtub. EXAM: CT HEAD WITHOUT CONTRAST CT MAXILLOFACIAL WITHOUT CONTRAST CT CERVICAL SPINE WITHOUT CONTRAST TECHNIQUE: Multidetector CT imaging of the head, cervical spine, and maxillofacial structures were performed using the standard protocol without intravenous contrast. Multiplanar CT image reconstructions of the cervical spine and maxillofacial structures were also generated. COMPARISON:  Head, maxillofacial, and cervical spine CT 11/28/2016 FINDINGS: CT HEAD FINDINGS Brain: There is no evidence of acute infarct, intracranial hemorrhage, midline shift, or extra-axial fluid collection. Chronic lacunar infarcts are again seen in the left caudate body and thalami. A 5 mm hyperdense mass in the third ventricle near the foramina of [place]ro is unchanged. Mild prominence of the ventricles is unchanged and likely secondary to cerebral atrophy. Cerebral white matter hypodensities are unchanged and nonspecific but compatible with mild chronic small vessel ischemic disease. Vascular: Calcified atherosclerosis at the skull base. No hyperdense vessel. Skull: No fracture or focal osseous lesion. Other: None. CT MAXILLOFACIAL FINDINGS Osseous: No acute fracture or  mandibular dislocation. Orbits: No acute orbital finding. Sinuses: Mild mucosal thickening and bubbly material in the left sphenoid sinus. Rightward nasal septal deviation. Clear mastoid air cells. Soft tissues: Unremarkable. CT CERVICAL SPINE FINDINGS Alignment: Unchanged straightening of the normal cervical lordosis. Unchanged grade 1 retrolisthesis of C3 on C4. Mildly increased grade 1 anterolisthesis of C7 on T1, likely degenerative. Skull base and vertebrae: No acute fracture or suspicious osseous lesion. C1-2 arthropathy, asymmetrically severe on the left. Unchanged mild basilar impression. Soft tissues and spinal canal: No prevertebral fluid or swelling. No visible canal hematoma. Disc levels: Diffuse cervical disc degeneration with severe disc space narrowing from C2-3-C6-7 and milder narrowing at C7-T1. Multilevel facet arthrosis, severe on the right at C4-5 where there is facet ankylosis. 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 Moderate to severe right-sided neural foraminal stenosis at C3-4 and C4-5. Upper chest: Clear lung apices. Other: None. IMPRESSION: 1. No evidence of acute intracranial abnormality. 2. Unchanged 5 mm colloid cyst. 3. Mild chronic small vessel ischemic disease with chronic deep gray nuclei lacunar infarcts. 4. No maxillofacial fracture. 5. No evidence of acute cervical spine fracture. Advanced disc degeneration. Electronically Signed   By: Sebastian AcheAllen  Grady M.D.   On: 10/14/2018 10:04   Ct Cervical Spine Wo Contrast  Result Date: 10/14/2018 CLINICAL DATA:  Larey SeatFell and hit nose on bathtub. EXAM: CT HEAD WITHOUT CONTRAST CT MAXILLOFACIAL WITHOUT CONTRAST CT CERVICAL SPINE WITHOUT CONTRAST TECHNIQUE: Multidetector CT imaging of the head, cervical spine, and maxillofacial structures were performed using the standard protocol without intravenous contrast. Multiplanar CT image reconstructions of the cervical spine and  maxillofacial structures were also generated. COMPARISON:  Head, maxillofacial, and cervical spine CT 11/28/2016 FINDINGS: CT HEAD FINDINGS Brain: There is no evidence of acute infarct, intracranial hemorrhage, midline shift, or extra-axial  fluid collection. Chronic lacunar infarcts are again seen in the left caudate body and thalami. A 5 mm hyperdense mass in the third ventricle near the foramina of [place] is unchanged. Mild prominence of the ventricles is unchanged and likely secondary to cerebral atrophy. Cerebral white matter hypodensities are unchanged and nonspecific but compatible with mild chronic small vessel ischemic disease. Vascular: Calcified atherosclerosis at the skull base. No hyperdense vessel. Skull: No fracture or focal osseous lesion. Other: None. CT MAXILLOFACIAL FINDINGS Osseous: No acute fracture or mandibular dislocation. Orbits: No acute orbital finding. Sinuses: Mild mucosal thickening and bubbly material in the left sphenoid sinus. Rightward nasal septal deviation. Clear mastoid air cells. Soft tissues: Unremarkable. CT CERVICAL SPINE FINDINGS Alignment: Unchanged straightening of the normal cervical lordosis. Unchanged grade 1 retrolisthesis of C3 on C4. Mildly increased grade 1 anterolisthesis of C7 on T1, likely degenerative. Skull base and vertebrae: No acute fracture or suspicious osseous lesion. C1-2 arthropathy, asymmetrically severe on the left. Unchanged mild basilar impression. Soft tissues and spinal canal: No prevertebral fluid or swelling. No visible canal hematoma. Disc levels: Diffuse cervical disc degeneration with severe disc space narrowing from C2-3-C6-7 and milder narrowing at C7-T1. Multilevel facet arthrosis, severe on the right at C4-5 where there is facet ankylosis. 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 Moderate to severe right-sided neural foraminal stenosis at C3-4 and C4-5. Upper chest:  Clear lung apices. Other: None. IMPRESSION: 1. No evidence of acute intracranial abnormality. 2. Unchanged 5 mm colloid cyst. 3. Mild chronic small vessel ischemic disease with chronic deep gray nuclei lacunar infarcts. 4. No maxillofacial fracture. 5. No evidence of acute cervical spine fracture. Advanced disc degeneration. Electronically Signed   By: Sebastian AcheAllen  Grady M.D.   On: 10/14/2018 10:04   Dg Chest Portable 1 View  Result Date: 11/10/2018 CLINICAL DATA:  Altered mental status EXAM: PORTABLE CHEST 1 VIEW COMPARISON:  10/14/2018, 08/23/2016 FINDINGS: The heart size and mediastinal contours are within normal limits. Both lungs are clear. The visualized skeletal structures are unremarkable. IMPRESSION: No active disease. Electronically Signed   By: Jasmine PangKim  Fujinaga M.D.   On: 11/10/2018 00:57   Dg Hip Unilat W Or Wo Pelvis 2-3 Views Left  Result Date: 10/14/2018 CLINICAL DATA:  Pain following fall EXAM: DG HIP (WITH OR WITHOUT PELVIS) 2-3V LEFT COMPARISON:  November 28, 2016 FINDINGS: Frontal pelvis as well as frontal and lateral left hip images were obtained. No evident fracture or dislocation. There is mild symmetric narrowing of each hip joints. Sacroiliac joints appear within normal limits. No erosive change. There is lower lumbar levoscoliosis, stable. IMPRESSION: Mild symmetric narrowing of each hip joint, essentially stable. No fracture or dislocation. Electronically Signed   By: Bretta BangWilliam  Woodruff III M.D.   On: 10/14/2018 09:36   Ct Maxillofacial Wo Contrast  Result Date: 10/14/2018 CLINICAL DATA:  Larey SeatFell and hit nose on bathtub. EXAM: CT HEAD WITHOUT CONTRAST CT MAXILLOFACIAL WITHOUT CONTRAST CT CERVICAL SPINE WITHOUT CONTRAST TECHNIQUE: Multidetector CT imaging of the head, cervical spine, and maxillofacial structures were performed using the standard protocol without intravenous contrast. Multiplanar CT image reconstructions of the cervical spine and maxillofacial structures were also generated.  COMPARISON:  Head, maxillofacial, and cervical spine CT 11/28/2016 FINDINGS: CT HEAD FINDINGS Brain: There is no evidence of acute infarct, intracranial hemorrhage, midline shift, or extra-axial fluid collection. Chronic lacunar infarcts are again seen in the left caudate body and thalami. A 5 mm hyperdense mass in the  third ventricle near the foramina of [place] is unchanged. Mild prominence of the ventricles is unchanged and likely secondary to cerebral atrophy. Cerebral white matter hypodensities are unchanged and nonspecific but compatible with mild chronic small vessel ischemic disease. Vascular: Calcified atherosclerosis at the skull base. No hyperdense vessel. Skull: No fracture or focal osseous lesion. Other: None. CT MAXILLOFACIAL FINDINGS Osseous: No acute fracture or mandibular dislocation. Orbits: No acute orbital finding. Sinuses: Mild mucosal thickening and bubbly material in the left sphenoid sinus. Rightward nasal septal deviation. Clear mastoid air cells. Soft tissues: Unremarkable. CT CERVICAL SPINE FINDINGS Alignment: Unchanged straightening of the normal cervical lordosis. Unchanged grade 1 retrolisthesis of C3 on C4. Mildly increased grade 1 anterolisthesis of C7 on T1, likely degenerative. Skull base and vertebrae: No acute fracture or suspicious osseous lesion. C1-2 arthropathy, asymmetrically severe on the left. Unchanged mild basilar impression. Soft tissues and spinal canal: No prevertebral fluid or swelling. No visible canal hematoma. Disc levels: Diffuse cervical disc degeneration with severe disc space narrowing from C2-3-C6-7 and milder narrowing at C7-T1. Multilevel facet arthrosis, severe on the right at C4-5 where there is facet ankylosis. 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 Moderate to severe right-sided neural foraminal stenosis at C3-4 and C4-5. Upper chest: Clear lung apices. Other: None. IMPRESSION: 1.  No evidence of acute intracranial abnormality. 2. Unchanged 5 mm colloid cyst. 3. Mild chronic small vessel ischemic disease with chronic deep gray nuclei lacunar infarcts. 4. No maxillofacial fracture. 5. No evidence of acute cervical spine fracture. Advanced disc degeneration. Electronically Signed   By: Sebastian Ache M.D.   On: 10/14/2018 10:04    Radiology No results found.  Procedures Procedures (including critical care time)  There is no Stroke.  Tegretol level is supratherapeutic poison control contacts.  Per poison control since this is not acute, stop tegretol and do not restart until PMD is seen.  Patient is to stop tegretol immediately.  Will need repeat tegretol level on Monday.  This was communicated to the facility verbally and and in written form on discharge paperwork.    Final Clinical Impressions(s) / ED Diagnoses   Return for intractable cough, coughing up blood,fevers >100.4 unrelieved by medication, shortness of breath, intractable vomiting, chest pain, shortness of breath, weakness,numbness, changes in speech, facial asymmetry,abdominal pain, passing out,Inability to tolerate liquids or food, cough, altered mental status or any concerns. No signs of systemic illness or infection. The patient is nontoxic-appearing on exam and vital signs are within normal limits.   I have reviewed the triage vital signs and the nursing notes. Pertinent labs &imaging results that were available during my care of the patient were reviewed by me and considered in my medical decision making (see chart for details).  After history, exam, and medical workup I feel the patient has been appropriately medically screened and is safe for discharge home. Pertinent diagnoses were discussed with the patient. Patient was given return precautions    Fitzpatrick Alberico, MD 11/10/18 1610

## 2018-11-10 NOTE — ED Notes (Signed)
This nurse spoke with poison control regarding pt critical Tegretol levels. Poison control recommended, as long as pt is at baseline mobility and baseline for eating and drinking, to send pt back to his group home with instructions to stop tegretol immediately and do not restart medication until pt sees PCP and tegretol levels are rechecked and medication dosage adjusted.

## 2018-11-10 NOTE — ED Notes (Signed)
While attempting to cath this pt additional assistance was needed by 2 techs due to pts combative behavior and physical resistance.

## 2018-11-10 NOTE — Discharge Instructions (Addendum)
Stop tegretol.  Recheck tegretol level on Monday and then until normal.  Do not restart drug until cleared by your family doctor

## 2018-12-06 ENCOUNTER — Encounter (HOSPITAL_COMMUNITY): Payer: Self-pay | Admitting: Emergency Medicine

## 2018-12-06 ENCOUNTER — Emergency Department (HOSPITAL_COMMUNITY)
Admission: EM | Admit: 2018-12-06 | Discharge: 2018-12-06 | Disposition: A | Discharge status: Home / Self Care | Payer: MEDICARE | Attending: Emergency Medicine | Admitting: Emergency Medicine

## 2018-12-06 ENCOUNTER — Other Ambulatory Visit: Payer: Self-pay

## 2018-12-06 DIAGNOSIS — Q909 Down syndrome, unspecified: Secondary | ICD-10-CM | POA: Diagnosis not present

## 2018-12-06 DIAGNOSIS — Z8673 Personal history of transient ischemic attack (TIA), and cerebral infarction without residual deficits: Secondary | ICD-10-CM | POA: Insufficient documentation

## 2018-12-06 DIAGNOSIS — E039 Hypothyroidism, unspecified: Secondary | ICD-10-CM | POA: Diagnosis not present

## 2018-12-06 DIAGNOSIS — Z7982 Long term (current) use of aspirin: Secondary | ICD-10-CM | POA: Insufficient documentation

## 2018-12-06 DIAGNOSIS — R569 Unspecified convulsions: Secondary | ICD-10-CM | POA: Diagnosis present

## 2018-12-06 DIAGNOSIS — Z79899 Other long term (current) drug therapy: Secondary | ICD-10-CM | POA: Insufficient documentation

## 2018-12-06 DIAGNOSIS — G40909 Epilepsy, unspecified, not intractable, without status epilepticus: Secondary | ICD-10-CM

## 2018-12-06 LAB — COMPREHENSIVE METABOLIC PANEL
ALT: 21 U/L (ref 0–44)
AST: 22 U/L (ref 15–41)
Albumin: 3.5 g/dL (ref 3.5–5.0)
Alkaline Phosphatase: 87 U/L (ref 38–126)
Anion gap: 7 (ref 5–15)
BUN: 19 mg/dL (ref 6–20)
CO2: 28 mmol/L (ref 22–32)
Calcium: 8.4 mg/dL — ABNORMAL LOW (ref 8.9–10.3)
Chloride: 104 mmol/L (ref 98–111)
Creatinine, Ser: 0.86 mg/dL (ref 0.61–1.24)
GFR calc Af Amer: >60 mL/min (ref >60)
GFR calc non Af Amer: >60 mL/min (ref >60)
Glucose, Bld: 153 mg/dL — ABNORMAL HIGH (ref 70–99)
Potassium: 3.9 mmol/L (ref 3.5–5.1)
Sodium: 139 mmol/L (ref 135–145)
Total Bilirubin: 0.2 mg/dL — ABNORMAL LOW (ref 0.3–1.2)
Total Protein: 7.2 g/dL (ref 6.5–8.1)

## 2018-12-06 LAB — CBC WITH DIFFERENTIAL/PLATELET
Abs Immature Granulocytes: 0.03 10*3/uL (ref 0.00–0.07)
Basophils Absolute: 0.1 10*3/uL (ref 0.0–0.1)
Basophils Relative: 1 %
Eosinophils Absolute: 0 10*3/uL (ref 0.0–0.5)
Eosinophils Relative: 0 %
HCT: 39.3 % (ref 39.0–52.0)
Hemoglobin: 12.7 g/dL — ABNORMAL LOW (ref 13.0–17.0)
Immature Granulocytes: 0 %
Lymphocytes Relative: 6 %
Lymphs Abs: 0.5 10*3/uL — ABNORMAL LOW (ref 0.7–4.0)
MCH: 37.4 pg — ABNORMAL HIGH (ref 26.0–34.0)
MCHC: 32.3 g/dL (ref 30.0–36.0)
MCV: 115.6 fL — ABNORMAL HIGH (ref 80.0–100.0)
Monocytes Absolute: 0.5 10*3/uL (ref 0.1–1.0)
Monocytes Relative: 7 %
Neutro Abs: 6.7 10*3/uL (ref 1.7–7.7)
Neutrophils Relative %: 86 %
Platelets: 212 10*3/uL (ref 150–400)
RBC: 3.4 MIL/uL — ABNORMAL LOW (ref 4.22–5.81)
RDW: 14.1 % (ref 11.5–15.5)
WBC: 7.8 10*3/uL (ref 4.0–10.5)
nRBC: 0 % (ref 0.0–0.2)

## 2018-12-06 LAB — CARBAMAZEPINE LEVEL, TOTAL: Carbamazepine Lvl: 14.4 ug/mL — ABNORMAL HIGH (ref 4.0–12.0)

## 2018-12-06 MED ORDER — SODIUM CHLORIDE 0.9 % IV BOLUS
1000.0000 mL | Freq: Once | INTRAVENOUS | Status: AC
  Administered 2018-12-06: 1000 mL via INTRAVENOUS

## 2018-12-06 NOTE — ED Provider Notes (Signed)
[facility] DEPT Provider Note   CSN: [id number] Arrival date & time: 12/06/18  1003     History   Chief Complaint Chief Complaint  Patient presents with  . Seizures    HPI VERLIN UHER is a 63 y.o. male.     HPI Patient presents to the emergency department for a seizure that occurred prior to arrival.  Patient is unable to give any history due to mental retardation and is nonverbal.  The report was given by EMS from the facility that he stays.  Patient had a seizure last 1 month ago.  Patient has been stable here in the emergency department. Past Medical History:  Diagnosis Date  . Anemia   . Down syndrome   . MR (mental retardation)   . Myopia with astigmatism   . Osteoporosis     Patient Active Problem List   Diagnosis Date Noted  . CVA (cerebral vascular accident) ([place]) 08/25/2016  . Right leg weakness 08/23/2016  . Hypothyroidism 06/26/2013  . Ileus ([place]) 06/25/2013  . Abdominal pain, unspecified site 06/25/2013  . Down syndrome 06/25/2013  . Constipation 06/25/2013    History reviewed. No pertinent surgical history.      Home Medications    Prior to Admission medications   Medication Sig Start Date End Date Taking? Authorizing Provider  alendronate (FOSAMAX) 70 MG tablet Take 70 mg by mouth every Friday. Take with a full glass of water on an empty stomach.    [provider]  aspirin EC 81 MG EC tablet Take 1 tablet (81 mg total) by mouth daily. 08/26/16   Caren Griffins, MD  atorvastatin (LIPITOR) 40 MG tablet Take 1 tablet (40 mg total) by mouth daily at 6 PM. Patient not taking: Reported on 11/09/2018 08/25/16   Caren Griffins, MD  Calcium Carbonate-Vitamin D 600-400 MG-UNIT tablet Take 1 tablet by mouth 2 (two) times daily.    [provider]  carBAMazepine (TEGRETOL) 100 MG/5ML suspension Take 200 mg by mouth 2 (two) times daily.     [provider]  cetaphil (CETAPHIL) lotion Apply 1  application topically daily. Apply topically to face daily     [provider]  Cholecalciferol (VITAMIN D3) 2000 units TABS Take 2,000 Units by mouth daily.    [provider]  enalapril (VASOTEC) 10 MG tablet Take 10 mg by mouth at bedtime.     [provider]  esomeprazole (NEXIUM) 40 MG capsule Take 40 mg by mouth at bedtime.    [provider]  ketoconazole (NIZORAL) 2 % shampoo Apply 1 application topically every Monday, Wednesday, and Friday.     [provider]  lactulose (CHRONULAC) 10 GM/15ML solution Take 10 g by mouth daily.     [provider]  levothyroxine (SYNTHROID) 50 MCG tablet Take 50 mcg by mouth daily before breakfast.     [provider]  lubiprostone (AMITIZA) 24 MCG capsule Take 24 mcg by mouth daily with breakfast.    [provider]  metroNIDAZOLE (METROGEL) 1 % gel Apply 1 application topically See admin instructions. Apply to face every evening for acne    [provider]  mometasone (ELOCON) 0.1 % cream Apply 1 application topically every other day. Apply sparingly to nasal bridge every other day    [provider]  polyvinyl alcohol (ARTIFICIAL TEARS) 1.4 % ophthalmic solution Place 1 drop into both eyes 3 (three) times daily.    [provider]  Family History History reviewed. No pertinent family history.  Social History Social History   Tobacco Use  . Smoking status: Never Smoker  . Smokeless tobacco: Never Used  Substance Use Topics  . Alcohol use: No  . Drug use: No     Allergies   Carbapenems, Cephalosporins, Other, Penicillamine, and Penicillins   Review of Systems Review of Systems Level 5 caveat applies due to mental retardation  Physical Exam Updated Vital Signs BP 128/71   Pulse (!) 50   Temp (!) 97.5 F (36.4 C) (Oral)   Resp 14   Ht 5' (1.524 m)   Wt 56.3 kg   SpO2 99%   BMI 24.24 kg/m   Physical Exam Vitals signs and  nursing note reviewed.  Constitutional:      General: He is not in acute distress.    Appearance: He is well-developed.  HENT:     Head: Normocephalic and atraumatic.  Eyes:     Pupils: Pupils are equal, round, and reactive to light.  Neck:     Musculoskeletal: Normal range of motion and neck supple.  Cardiovascular:     Rate and Rhythm: Normal rate and regular rhythm.     Heart sounds: Normal heart sounds. No murmur. No friction rub. No gallop.   Pulmonary:     Effort: Pulmonary effort is normal. No respiratory distress.     Breath sounds: Normal breath sounds. No wheezing.  Abdominal:     General: Bowel sounds are normal. There is no distension.     Palpations: Abdomen is soft.     Tenderness: There is no abdominal tenderness.  Skin:    General: Skin is warm and dry.     Capillary Refill: Capillary refill takes less than 2 seconds.     Findings: No erythema or rash.  Neurological:     Mental Status: He is alert. Mental status is at baseline.     Motor: No abnormal muscle tone.     Coordination: Coordination normal.  Psychiatric:        Behavior: Behavior normal.      ED Treatments / Results  Labs (all labs ordered are listed, but only abnormal results are displayed) Labs Reviewed  CARBAMAZEPINE LEVEL, TOTAL - Abnormal; Notable for the following components:      Result Value   Carbamazepine Lvl 14.4 (*)    All other components within normal limits    EKG None  Radiology No results found.  Procedures Procedures (including critical care time)  Medications Ordered in ED Medications - No data to display   Initial Impression / Assessment and Plan / ED Course  I have reviewed the triage vital signs and the nursing notes.  Pertinent labs & imaging results that were available during my care of the patient were reviewed by me and considered in my medical decision making (see chart for details).        Patient will be discharged back to his group home.   Patient has been stable while here in the emergency department.  Final Clinical Impressions(s) / ED Diagnoses   Final diagnoses:  None    ED Discharge Orders    None       Charlestine NightLawyer, Catheline Hixon, PA-C 12/06/18 1328    Pricilla LovelessGoldston, Scott, MD 12/07/18 [phone]

## 2018-12-06 NOTE — ED Triage Notes (Signed)
Patient arrived by EMS from [facility] ([place]). PT had witnessed seizure.  Staff at group home pt was sitting on the sofa while having a seizure.   PT is alert.   PT is usually mute and doesn't answer any questions per EMS this is his baseline.   Contact information for facility is at bedside.    Patient has Down Syndrome.

## 2018-12-06 NOTE — ED Notes (Signed)
Patient unable to sign for discharge due to disability. This Probation officer notified group home ([facility], Therapist, sports) about patient being discharged. Group home stated a [name] will be coming to pick up patient.   Patient is ambulatory upon discharge.

## 2018-12-06 NOTE — Discharge Instructions (Addendum)
Return here as needed.  Follow-up with his neurologist.

## 2018-12-06 NOTE — ED Notes (Signed)
Tabitha RN called from [facility] asking for update on patient's status. This RN gave [place] RN report on patient.

## 2018-12-20 ENCOUNTER — Encounter (HOSPITAL_COMMUNITY): Payer: Self-pay | Admitting: Emergency Medicine

## 2018-12-20 ENCOUNTER — Emergency Department (HOSPITAL_COMMUNITY)
Admission: EM | Admit: 2018-12-20 | Discharge: 2018-12-21 | Disposition: A | Discharge status: Home / Self Care | Payer: MEDICARE | Attending: Emergency Medicine | Admitting: Emergency Medicine

## 2018-12-20 ENCOUNTER — Other Ambulatory Visit: Payer: Self-pay

## 2018-12-20 DIAGNOSIS — E039 Hypothyroidism, unspecified: Secondary | ICD-10-CM | POA: Insufficient documentation

## 2018-12-20 DIAGNOSIS — Z7982 Long term (current) use of aspirin: Secondary | ICD-10-CM | POA: Diagnosis not present

## 2018-12-20 DIAGNOSIS — S1083XA Contusion of other specified part of neck, initial encounter: Secondary | ICD-10-CM | POA: Insufficient documentation

## 2018-12-20 DIAGNOSIS — Z79899 Other long term (current) drug therapy: Secondary | ICD-10-CM | POA: Diagnosis not present

## 2018-12-20 DIAGNOSIS — D72819 Decreased white blood cell count, unspecified: Secondary | ICD-10-CM

## 2018-12-20 DIAGNOSIS — Y999 Unspecified external cause status: Secondary | ICD-10-CM | POA: Insufficient documentation

## 2018-12-20 DIAGNOSIS — W19XXXA Unspecified fall, initial encounter: Secondary | ICD-10-CM | POA: Diagnosis not present

## 2018-12-20 DIAGNOSIS — F99 Mental disorder, not otherwise specified: Secondary | ICD-10-CM | POA: Diagnosis not present

## 2018-12-20 DIAGNOSIS — Y9210 Unspecified residential institution as the place of occurrence of the external cause: Secondary | ICD-10-CM | POA: Diagnosis not present

## 2018-12-20 DIAGNOSIS — S0990XA Unspecified injury of head, initial encounter: Secondary | ICD-10-CM | POA: Diagnosis present

## 2018-12-20 DIAGNOSIS — S0083XA Contusion of other part of head, initial encounter: Secondary | ICD-10-CM | POA: Diagnosis not present

## 2018-12-20 DIAGNOSIS — Y939 Activity, unspecified: Secondary | ICD-10-CM | POA: Insufficient documentation

## 2018-12-20 NOTE — ED Triage Notes (Signed)
Pt brought in by [employer] aide. Pt had multiple falls this weekend and an unwitnessed fall on Wednesday. Pt has hx of dementia, unable to provide hx. Pt has bruising around face.  Healthcare aide, Karrie Doffing who brought pt can be reached at [phone] The on-call nurse can be reached at [phone]

## 2018-12-21 ENCOUNTER — Emergency Department (HOSPITAL_COMMUNITY): Payer: MEDICARE

## 2018-12-21 DIAGNOSIS — S0083XA Contusion of other part of head, initial encounter: Secondary | ICD-10-CM | POA: Diagnosis not present

## 2018-12-21 LAB — CBC WITH DIFFERENTIAL/PLATELET
Abs Immature Granulocytes: 0.01 10*3/uL (ref 0.00–0.07)
Basophils Absolute: 0.1 10*3/uL (ref 0.0–0.1)
Basophils Relative: 2 %
Eosinophils Absolute: 0.1 10*3/uL (ref 0.0–0.5)
Eosinophils Relative: 2 %
HCT: 37.2 % — ABNORMAL LOW (ref 39.0–52.0)
Hemoglobin: 12 g/dL — ABNORMAL LOW (ref 13.0–17.0)
Immature Granulocytes: 0 %
Lymphocytes Relative: 24 %
Lymphs Abs: 0.7 10*3/uL (ref 0.7–4.0)
MCH: 37.5 pg — ABNORMAL HIGH (ref 26.0–34.0)
MCHC: 32.3 g/dL (ref 30.0–36.0)
MCV: 116.3 fL — ABNORMAL HIGH (ref 80.0–100.0)
Monocytes Absolute: 0.3 10*3/uL (ref 0.1–1.0)
Monocytes Relative: 11 %
Neutro Abs: 1.6 10*3/uL — ABNORMAL LOW (ref 1.7–7.7)
Neutrophils Relative %: 61 %
Platelets: 198 10*3/uL (ref 150–400)
RBC: 3.2 MIL/uL — ABNORMAL LOW (ref 4.22–5.81)
RDW: 14.4 % (ref 11.5–15.5)
WBC: 2.7 10*3/uL — ABNORMAL LOW (ref 4.0–10.5)
nRBC: 0 % (ref 0.0–0.2)

## 2018-12-21 LAB — BASIC METABOLIC PANEL
Anion gap: 8 (ref 5–15)
BUN: 13 mg/dL (ref 6–20)
CO2: 27 mmol/L (ref 22–32)
Calcium: 8.5 mg/dL — ABNORMAL LOW (ref 8.9–10.3)
Chloride: 104 mmol/L (ref 98–111)
Creatinine, Ser: 0.92 mg/dL (ref 0.61–1.24)
GFR calc Af Amer: >60 mL/min (ref >60)
GFR calc non Af Amer: >60 mL/min (ref >60)
Glucose, Bld: 93 mg/dL (ref 70–99)
Potassium: 4.1 mmol/L (ref 3.5–5.1)
Sodium: 139 mmol/L (ref 135–145)

## 2018-12-21 LAB — CARBAMAZEPINE LEVEL, TOTAL: Carbamazepine Lvl: 7.3 ug/mL (ref 4.0–12.0)

## 2018-12-21 NOTE — ED Provider Notes (Signed)
[facility]   CSN: [id number] Arrival date & time: 12/20/18  2148    History   Chief Complaint Chief Complaint  Patient presents with   Fall    HPI Ronnell GuadalajaraCalvin B Baratta is a 63 y.o. male.     63 year old male brought in by [facility] home health aide for multiple unwitnessed falls with bruising to the face.  Patient is not on blood thinners, patient is unable to provide any history due to down syndrome/patient is non verbal.     Past Medical History:  Diagnosis Date   Anemia    Down syndrome    MR (mental retardation)    Myopia with astigmatism    Osteoporosis     Patient Active Problem List   Diagnosis Date Noted   CVA (cerebral vascular accident) ([facility]) 08/25/2016   Right leg weakness 08/23/2016   Hypothyroidism 06/26/2013   Ileus ([facility]) 06/25/2013   Abdominal pain, unspecified site 06/25/2013   Down syndrome 06/25/2013   Constipation 06/25/2013    History reviewed. No pertinent surgical history.      Home Medications    Prior to Admission medications   Medication Sig Start Date End Date Taking? Authorizing Provider  alendronate (FOSAMAX) 70 MG tablet Take 70 mg by mouth every Friday. Take with a full glass of water on an empty stomach.    [provider]  aspirin EC 81 MG EC tablet Take 1 tablet (81 mg total) by mouth daily. 08/26/16   Leatha GildingGherghe, Costin M, MD  atorvastatin (LIPITOR) 40 MG tablet Take 1 tablet (40 mg total) by mouth daily at 6 PM. Patient not taking: Reported on 11/09/2018 08/25/16   Leatha GildingGherghe, Costin M, MD  Calcium Carbonate-Vitamin D 600-400 MG-UNIT tablet Take 1 tablet by mouth 2 (two) times daily.    [provider]  carBAMazepine (TEGRETOL) 100 MG/5ML suspension Take 200 mg by mouth 2 (two) times daily.     [provider]  cetaphil (CETAPHIL) lotion Apply 1 application topically daily. Apply topically to face daily     [provider]  Cholecalciferol  (VITAMIN D3) 2000 units TABS Take 2,000 Units by mouth daily.    [provider]  enalapril (VASOTEC) 10 MG tablet Take 10 mg by mouth at bedtime.     [provider]  esomeprazole (NEXIUM) 40 MG capsule Take 40 mg by mouth at bedtime.    [provider]  ketoconazole (NIZORAL) 2 % shampoo Apply 1 application topically every Monday, Wednesday, and Friday.     [provider]  lactulose (CHRONULAC) 10 GM/15ML solution Take 15 g by mouth daily.     [provider]  levothyroxine (SYNTHROID) 50 MCG tablet Take 50 mcg by mouth daily before breakfast.     [provider]  lubiprostone (AMITIZA) 24 MCG capsule Take 24 mcg by mouth 2 (two) times a day.     [provider]  metroNIDAZOLE (METROGEL) 1 % gel Apply 1 application topically See admin instructions. Apply to face every evening for acne    [provider]  mometasone (ELOCON) 0.1 % cream Apply 1 application topically every other day. Apply sparingly to nasal bridge every other day    [provider]  nabumetone (RELAFEN) 500 MG tablet Take 500 mg by mouth daily. 02/04/14   [provider]  polyvinyl alcohol (ARTIFICIAL TEARS) 1.4 % ophthalmic solution Place 1 drop into both eyes 3 (three) times daily.    [provider]  Soap & Cleansers (PHISODERM NORMAL TO OILY EX) Apply 1 application topically daily. To face    [provider]    Family History History reviewed. No pertinent family history.  Social History Social History   Tobacco Use   Smoking status: Never Smoker   Smokeless tobacco: Never Used  Substance Use Topics   Alcohol use: No   Drug use: No     Allergies   Carbapenems, Cephalosporins, Other, Penicillamine, and Penicillins   Review of Systems Review of Systems  Unable to perform ROS: Patient nonverbal     Physical Exam Updated Vital Signs BP 121/71    Pulse (!) 53    Temp 98.4 F (36.9 C) (Oral)    Resp  16    SpO2 96%   Physical Exam Vitals signs and nursing note reviewed.  Constitutional:      General: He is not in acute distress.    Appearance: He is well-developed. He is not diaphoretic.  HENT:     Head: Normocephalic.      Nose: Nose normal.     Mouth/Throat:     Mouth: Mucous membranes are moist.  Eyes:     Conjunctiva/sclera: Conjunctivae normal.  Neck:     Musculoskeletal: No muscular tenderness.  Cardiovascular:     Rate and Rhythm: Normal rate.     Pulses: Normal pulses.  Pulmonary:     Effort: Pulmonary effort is normal.  Abdominal:     Tenderness: There is no abdominal tenderness.  Musculoskeletal:        General: No swelling, tenderness, deformity or signs of injury.  Skin:    General: Skin is warm and dry.  Neurological:     Mental Status: He is alert. Mental status is at baseline.      ED Treatments / Results  Labs (all labs ordered are listed, but only abnormal results are displayed) Labs Reviewed  BASIC METABOLIC PANEL - Abnormal; Notable for the following components:      Result Value   Calcium 8.5 (*)    All other components within normal limits  CBC WITH DIFFERENTIAL/PLATELET - Abnormal; Notable for the following components:   WBC 2.7 (*)    RBC 3.20 (*)    Hemoglobin 12.0 (*)    HCT 37.2 (*)    MCV 116.3 (*)    MCH 37.5 (*)    Neutro Abs 1.6 (*)    All other components within normal limits  CARBAMAZEPINE LEVEL, TOTAL    EKG None  Radiology Ct Head Wo Contrast  Result Date: 12/21/2018 CLINICAL DATA:  Multiple falls.  Bruising to right-side of face. EXAM: CT HEAD WITHOUT CONTRAST CT MAXILLOFACIAL WITHOUT CONTRAST CT CERVICAL SPINE WITHOUT CONTRAST TECHNIQUE: Multidetector CT imaging of the head, cervical spine, and maxillofacial structures were performed using the standard protocol without intravenous contrast. Multiplanar CT image reconstructions of the cervical spine and maxillofacial structures were also generated. COMPARISON:   11/10/2018 and 10/14/2018 FINDINGS: CT HEAD FINDINGS Brain: No evidence of acute infarction, hemorrhage, hydrocephalus, extra-axial collection or mass lesion/mass effect. There is diffuse low-attenuation within the subcortical and periventricular white matter compatible with chronic microvascular disease. Prominence of the sulci and ventricles compatible with brain atrophy. Vascular: No hyperdense vessel or unexpected calcification. Skull: Normal. Negative for fracture or focal lesion. Other: None CT MAXILLOFACIAL FINDINGS Osseous: No fracture or mandibular dislocation. No destructive process. Orbits: Negative. No traumatic or inflammatory finding. Sinuses: No air-fluid levels. Mild mucosal thickening within the sphenoid sinus. Soft tissues: Negative. CT  CERVICAL SPINE FINDINGS Alignment: Reversal of normal cervical lordosis. Skull base and vertebrae: No acute fracture. No primary bone lesion or focal pathologic process. Soft tissues and spinal canal: No prevertebral fluid or swelling. No visible canal hematoma. Disc levels: Advanced multi level degenerative disc disease is identified throughout the cervical spine. Upper chest: Clear Other: None IMPRESSION: 1. No acute intracranial abnormality. 2. 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 3. No maxillofacial fracture. 4. No evidence for acute cervical spine fracture. Advanced cervical disc degeneration noted. Electronically Signed   By: Signa Kellaylor  Stroud M.D.   On: 12/21/2018 09:38   Ct Cervical Spine Wo Contrast  Result Date: 12/21/2018 CLINICAL DATA:  Multiple falls.  Bruising to right-side of face. EXAM: CT HEAD WITHOUT CONTRAST CT MAXILLOFACIAL WITHOUT CONTRAST CT CERVICAL SPINE WITHOUT CONTRAST TECHNIQUE: Multidetector CT imaging of the head, cervical spine, and maxillofacial structures were performed using the standard protocol without intravenous contrast. Multiplanar CT image reconstructions of the cervical spine and maxillofacial structures  were also generated. COMPARISON:  11/10/2018 and 10/14/2018 FINDINGS: CT HEAD FINDINGS Brain: No evidence of acute infarction, hemorrhage, hydrocephalus, extra-axial collection or mass lesion/mass effect. There is diffuse low-attenuation within the subcortical and periventricular white matter compatible with chronic microvascular disease. Prominence of the sulci and ventricles compatible with brain atrophy. Vascular: No hyperdense vessel or unexpected calcification. Skull: Normal. Negative for fracture or focal lesion. Other: None CT MAXILLOFACIAL FINDINGS Osseous: No fracture or mandibular dislocation. No destructive process. Orbits: Negative. No traumatic or inflammatory finding. Sinuses: No air-fluid levels. Mild mucosal thickening within the sphenoid sinus. Soft tissues: Negative. CT CERVICAL SPINE FINDINGS Alignment: Reversal of normal cervical lordosis. Skull base and vertebrae: No acute fracture. No primary bone lesion or focal pathologic process. Soft tissues and spinal canal: No prevertebral fluid or swelling. No visible canal hematoma. Disc levels: Advanced multi level degenerative disc disease is identified throughout the cervical spine. Upper chest: Clear Other: None IMPRESSION: 1. No acute intracranial abnormality. 2. 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 3. No maxillofacial fracture. 4. No evidence for acute cervical spine fracture. Advanced cervical disc degeneration noted. Electronically Signed   By: Signa Kellaylor  Stroud M.D.   On: 12/21/2018 09:38   Ct Maxillofacial Wo Cm  Result Date: 12/21/2018 CLINICAL DATA:  Multiple falls.  Bruising to right-side of face. EXAM: CT HEAD WITHOUT CONTRAST CT MAXILLOFACIAL WITHOUT CONTRAST CT CERVICAL SPINE WITHOUT CONTRAST TECHNIQUE: Multidetector CT imaging of the head, cervical spine, and maxillofacial structures were performed using the standard protocol without intravenous contrast. Multiplanar CT image reconstructions of the cervical spine  and maxillofacial structures were also generated. COMPARISON:  11/10/2018 and 10/14/2018 FINDINGS: CT HEAD FINDINGS Brain: No evidence of acute infarction, hemorrhage, hydrocephalus, extra-axial collection or mass lesion/mass effect. There is diffuse low-attenuation within the subcortical and periventricular white matter compatible with chronic microvascular disease. Prominence of the sulci and ventricles compatible with brain atrophy. Vascular: No hyperdense vessel or unexpected calcification. Skull: Normal. Negative for fracture or focal lesion. Other: None CT MAXILLOFACIAL FINDINGS Osseous: No fracture or mandibular dislocation. No destructive process. Orbits: Negative. No traumatic or inflammatory finding. Sinuses: No air-fluid levels. Mild mucosal thickening within the sphenoid sinus. Soft tissues: Negative. CT CERVICAL SPINE FINDINGS Alignment: Reversal of normal cervical lordosis. Skull base and vertebrae: No acute fracture. No primary bone lesion or focal pathologic process. Soft tissues and spinal canal: No prevertebral fluid or swelling. No visible canal hematoma. Disc levels: Advanced multi level degenerative disc disease is identified throughout the cervical  spine. Upper chest: Clear Other: None IMPRESSION: 1. No acute intracranial abnormality. 2. 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 3. No maxillofacial fracture. 4. No evidence for acute cervical spine fracture. Advanced cervical disc degeneration noted. Electronically Signed   By: Kerby Moors M.D.   On: 12/21/2018 09:38    Procedures Procedures (including critical care time)  Medications Ordered in ED Medications - No data to display   Initial Impression / Assessment and Plan / ED Course  I have reviewed the triage vital signs and the nursing notes.  Pertinent labs & imaging results that were available during my care of the patient were reviewed by me and considered in my medical decision making (see chart for  details).  Clinical Course as of Dec 20 998  Sat Dec 21, 4458  548 63 year old male sent by group home for unwitnessed fall.  Patient has bruising to his right forehead as well as both sides of his neck and mandible areas.  Patient is nonverbal, does not provide history today.  CT of the head, face, C-spine without acute injuries or findings.  Review of lab work, BMP unremarkable, Tegretol level therapeutic.  CBC with neutropenia, white blood cell count 2.7, recommend follow-up with repeat lab draw next week through PCP.  Hemoglobin unchanged from baseline at 12.0.  Results discussed with Sharen Hones, on-call nurse for facility, patient sent to the ER for evaluation after unwitnessed fall, no further history or information for today's visit, verbalizes understanding of plan of care, will send Herbie Baltimore to pick patient up.   [LM]    Clinical Course User Index [LM] Tacy Learn, PA-C      Final Clinical Impressions(s) / ED Diagnoses   Final diagnoses:  Fall, initial encounter  Contusion of face, initial encounter  Leukopenia, unspecified type    ED Discharge Orders    None       Tacy Learn, PA-C 12/21/18 1000    Gareth Morgan, MD 12/24/18 1605

## 2018-12-21 NOTE — ED Notes (Signed)
Robert care giver would like a call for updates [phone]

## 2018-12-21 NOTE — ED Notes (Signed)
Patient transported to CT 

## 2018-12-21 NOTE — ED Notes (Signed)
Attempted to recheck VS; pt became agitated and pulled away

## 2018-12-21 NOTE — Discharge Instructions (Addendum)
CT of the head, neck, face look ok, no fractures. WBC is low today, recommend recheck with repeat lab work next week. Tegretol level is therapeutic.

## 2018-12-21 NOTE — ED Notes (Signed)
Robert, caregiver called and stated he would pick up pt.

## 2019-01-09 ENCOUNTER — Other Ambulatory Visit: Payer: Self-pay

## 2019-01-09 ENCOUNTER — Emergency Department (HOSPITAL_COMMUNITY)
Admission: EM | Admit: 2019-01-09 | Discharge: 2019-01-09 | Disposition: A | Discharge status: Home / Self Care | Payer: MEDICARE | Attending: Emergency Medicine | Admitting: Emergency Medicine

## 2019-01-09 DIAGNOSIS — Q909 Down syndrome, unspecified: Secondary | ICD-10-CM | POA: Diagnosis not present

## 2019-01-09 DIAGNOSIS — Z7982 Long term (current) use of aspirin: Secondary | ICD-10-CM | POA: Diagnosis not present

## 2019-01-09 DIAGNOSIS — Z79899 Other long term (current) drug therapy: Secondary | ICD-10-CM | POA: Diagnosis not present

## 2019-01-09 DIAGNOSIS — Z880 Allergy status to penicillin: Secondary | ICD-10-CM | POA: Diagnosis not present

## 2019-01-09 DIAGNOSIS — F0390 Unspecified dementia without behavioral disturbance: Secondary | ICD-10-CM | POA: Insufficient documentation

## 2019-01-09 DIAGNOSIS — F70 Mild intellectual disabilities: Secondary | ICD-10-CM | POA: Insufficient documentation

## 2019-01-09 NOTE — ED Triage Notes (Signed)
Pt here from [facility] for abnormal ekg and bradycardia. Pt has recently had more frequent falls which promoted them to do an ekg, which showed new RBBB. With EMS, EKG is normal. Pt nonverbal.

## 2019-01-09 NOTE — ED Provider Notes (Addendum)
[facility] EMERGENCY DEPARTMENT Provider Note   CSN: [id number] Arrival date & time: 01/09/19  1721     History   Chief Complaint No chief complaint on file.   HPI Travis Hanna is a 63 y.o. male.     Patient with hx Down syndrome, presents via ems with concern for possible ecg. Patient is alert, content. Level 5 caveat - pt limited historian, hx dementia, hx Down. Patient appears alert and content, no apparent pain or distress.   The history is provided by the patient and the EMS personnel. The history is limited by the condition of the patient.    Past Medical History:  Diagnosis Date  . Anemia   . Down syndrome   . MR (mental retardation)   . Myopia with astigmatism   . Osteoporosis     Patient Active Problem List   Diagnosis Date Noted  . CVA (cerebral vascular accident) ([place]) 08/25/2016  . Right leg weakness 08/23/2016  . Hypothyroidism 06/26/2013  . Ileus ([place]) 06/25/2013  . Abdominal pain, unspecified site 06/25/2013  . Down syndrome 06/25/2013  . Constipation 06/25/2013    No past surgical history on file.      Home Medications    Prior to Admission medications   Medication Sig Start Date End Date Taking? Authorizing Provider  alendronate (FOSAMAX) 70 MG tablet Take 70 mg by mouth every Friday. Take with a full glass of water on an empty stomach.    [provider]  aspirin EC 81 MG EC tablet Take 1 tablet (81 mg total) by mouth daily. 08/26/16   Caren Griffins, MD  atorvastatin (LIPITOR) 40 MG tablet Take 1 tablet (40 mg total) by mouth daily at 6 PM. Patient not taking: Reported on 11/09/2018 08/25/16   Caren Griffins, MD  Calcium Carbonate-Vitamin D 600-400 MG-UNIT tablet Take 1 tablet by mouth 2 (two) times daily.    [provider]  carBAMazepine (TEGRETOL) 100 MG/5ML suspension Take 200 mg by mouth 2 (two) times daily.     [provider]  cetaphil (CETAPHIL) lotion Apply 1 application topically  daily. Apply topically to face daily     [provider]  Cholecalciferol (VITAMIN D3) 2000 units TABS Take 2,000 Units by mouth daily.    [provider]  enalapril (VASOTEC) 10 MG tablet Take 10 mg by mouth at bedtime.     [provider]  esomeprazole (NEXIUM) 40 MG capsule Take 40 mg by mouth at bedtime.    [provider]  ketoconazole (NIZORAL) 2 % shampoo Apply 1 application topically every Monday, Wednesday, and Friday.     [provider]  lactulose (CHRONULAC) 10 GM/15ML solution Take 15 g by mouth daily.     [provider]  levothyroxine (SYNTHROID) 50 MCG tablet Take 50 mcg by mouth daily before breakfast.     [provider]  lubiprostone (AMITIZA) 24 MCG capsule Take 24 mcg by mouth 2 (two) times a day.     [provider]  metroNIDAZOLE (METROGEL) 1 % gel Apply 1 application topically See admin instructions. Apply to face every evening for acne    [provider]  mometasone (ELOCON) 0.1 % cream Apply 1 application topically every other day. Apply sparingly to nasal bridge every other day    [provider]  nabumetone (RELAFEN) 500 MG tablet Take 500 mg by mouth daily. 02/04/14   [provider]  polyvinyl alcohol (ARTIFICIAL TEARS) 1.4 % ophthalmic  solution Place 1 drop into both eyes 3 (three) times daily.    [provider]  Soap & Cleansers (PHISODERM NORMAL TO OILY EX) Apply 1 application topically daily. To face    [provider]    Family History No family history on file.  Social History Social History   Tobacco Use  . Smoking status: Never Smoker  . Smokeless tobacco: Never Used  Substance Use Topics  . Alcohol use: No  . Drug use: No     Allergies   Carbapenems, Cephalosporins, Other, Penicillamine, and Penicillins   Review of Systems Review of Systems  Unable to perform ROS: Dementia  Constitutional: Negative for fever.  Respiratory:  Negative for shortness of breath.   Gastrointestinal: Negative for vomiting.   level 5 caveat  - Downs/MR.    Physical Exam Updated Vital Signs BP 138/80 (BP Location: Right Arm)   Pulse 96   Temp 97.9 F (36.6 C) (Oral)   Resp 16   SpO2 96%   Physical Exam Vitals signs and nursing note reviewed.  Constitutional:      Appearance: Normal appearance. He is well-developed.  HENT:     Head: Atraumatic.     Nose: Nose normal.     Mouth/Throat:     Mouth: Mucous membranes are moist.     Pharynx: Oropharynx is clear.  Eyes:     General: No scleral icterus.    Conjunctiva/sclera: Conjunctivae normal.  Neck:     Musculoskeletal: No neck rigidity or muscular tenderness.     Trachea: No tracheal deviation.  Cardiovascular:     Rate and Rhythm: Normal rate and regular rhythm.     Pulses: Normal pulses.     Heart sounds: Normal heart sounds. No murmur. No friction rub. No gallop.   Pulmonary:     Effort: Pulmonary effort is normal. No accessory muscle usage or respiratory distress.     Breath sounds: Normal breath sounds.  Abdominal:     General: Bowel sounds are normal. There is no distension.     Palpations: Abdomen is soft.     Tenderness: There is no abdominal tenderness.  Genitourinary:    Comments: No cva tenderness. Musculoskeletal:        General: No swelling or tenderness.  Skin:    General: Skin is warm and dry.     Findings: No rash.  Neurological:     Mental Status: He is alert.     Comments: Alert, speech clear. Moves bil ext purposefully.   Psychiatric:        Mood and Affect: Mood normal.      ED Treatments / Results  Labs (all labs ordered are listed, but only abnormal results are displayed) Labs Reviewed - No data to display  EKG EKG Interpretation  Date/Time:  Thursday January 09 2019 17:37:18 EDT Ventricular Rate:  53 PR Interval:    QRS Duration: 101 QT Interval:  415 QTC Calculation: 390 R Axis:   70 Text Interpretation:  Sinus rhythm  Nonspecific ST abnormality No significant change since last tracing Confirmed by Cathren Laine ([id number]) on 01/09/2019 5:49:25 PM   Radiology No results found.  Procedures Procedures (including critical care time)  Medications Ordered in ED Medications - No data to display   Initial Impression / Assessment and Plan / ED Course  I have reviewed the triage vital signs and the nursing notes.  Pertinent labs & imaging results that were available during my care of the patient were reviewed by  me and considered in my medical decision making (see chart for details).  Patient remains alert, content. No apparent pain or discomfort.   ECG appears similar to prior.   Reviewed nursing notes and prior charts for additional history.   Patient with recent labs approx 2 weeks ago - appeared c/w baseline.  Patient vitals normal, no distress - appears stable for d/c.     Final Clinical Impressions(s) / ED Diagnoses   Final diagnoses:  None    ED Discharge Orders    None         Cathren LaineSteinl, Crissie Aloi, MD 01/09/19 [phone]

## 2019-01-09 NOTE — Discharge Instructions (Addendum)
It was our pleasure to provide your ER care today - we hope that you feel better.  ECG appears similar to prior.  Return to ER if worse, new symptoms, high fevers, increased trouble breathing, or other concern.

## 2019-01-22 NOTE — Progress Notes (Deleted)
Patient referred by Royals, [name], MD for ***  Subjective:   Travis Hanna, male    DOB: [birth year], 63 y.o.   MRN: [id number]   No chief complaint on file.   *** HPI  63 y.o. *** male with ***  *** Past Medical History:  Diagnosis Date  . Anemia   . Down syndrome   . MR (mental retardation)   . Myopia with astigmatism   . Osteoporosis     *** No past surgical history on file.  *** Social History   Socioeconomic History  . Marital status: Single    Spouse name: Not on file  . Number of children: Not on file  . Years of education: Not on file  . Highest education level: Not on file  Occupational History  . Not on file  Social Needs  . Financial resource strain: Not on file  . Food insecurity    Worry: Not on file    Inability: Not on file  . Transportation needs    Medical: Not on file    Non-medical: Not on file  Tobacco Use  . Smoking status: Never Smoker  . Smokeless tobacco: Never Used  Substance and Sexual Activity  . Alcohol use: No  . Drug use: No  . Sexual activity: Never  Lifestyle  . Physical activity    Days per week: Not on file    Minutes per session: Not on file  . Stress: Not on file  Relationships  . Social Musicianconnections    Talks on phone: Not on file    Gets together: Not on file    Attends religious service: Not on file    Active member of club or organization: Not on file    Attends meetings of clubs or organizations: Not on file    Relationship status: Not on file  . Intimate partner violence    Fear of current or ex partner: Not on file    Emotionally abused: Not on file    Physically abused: Not on file    Forced sexual activity: Not on file  Other Topics Concern  . Not on file  Social History Narrative  . Not on file    *** No family history on file.  *** Current Outpatient Medications on File Prior to Visit  Medication Sig Dispense Refill  . alendronate (FOSAMAX) 70 MG tablet Take 70 mg by mouth every  Friday. Take with a full glass of water on an empty stomach.    Marland Kitchen. aspirin EC 81 MG EC tablet Take 1 tablet (81 mg total) by mouth daily.    . Calcium Carbonate-Vitamin D 600-400 MG-UNIT tablet Take 1 tablet by mouth 2 (two) times daily.    . carBAMazepine (TEGRETOL) 100 MG/5ML suspension Take 200 mg by mouth 2 (two) times daily.     . cetaphil (CETAPHIL) lotion Apply 1 application topically daily. Apply topically to face daily     . enalapril (VASOTEC) 10 MG tablet Take 10 mg by mouth at bedtime.     Marland Kitchen. esomeprazole (NEXIUM) 40 MG capsule Take 40 mg by mouth at bedtime.    Marland Kitchen. ketoconazole (NIZORAL) 2 % shampoo Apply 1 application topically every Monday, Wednesday, and Friday.     . lactulose (CHRONULAC) 10 GM/15ML solution Take 15 g by mouth daily.     Marland Kitchen. levothyroxine (SYNTHROID) 50 MCG tablet Take 50 mcg by mouth daily before breakfast.     . lubiprostone (AMITIZA) 24 MCG capsule  Take 24 mcg by mouth 2 (two) times a day.     . metroNIDAZOLE (METROGEL) 1 % gel Apply 1 application topically See admin instructions. Apply to face every evening for acne    . mometasone (ELOCON) 0.1 % cream Apply 1 application topically every other day. Apply sparingly to nasal bridge every other day    . polyvinyl alcohol (ARTIFICIAL TEARS) 1.4 % ophthalmic solution Place 1 drop into both eyes 3 (three) times daily.    Marland Kitchen Soap & Cleansers (PHISODERM NORMAL TO OILY EX) Apply 1 application topically daily. To face     No current facility-administered medications on file prior to visit.     Cardiovascular studies:  ***  *** Recent labs: ***  *** ROS      *** There were no vitals filed for this visit.   There is no height or weight on file to calculate BMI. There were no vitals filed for this visit.  *** Objective:   Physical Exam        Assessment & Recommendations:   ***  ***   Thank you for referring the patient to [place]. Please feel free to contact with any questions.  Nigel Mormon, MD [facility] Pager: [phone] Office: [phone] If no answer Cell [phone]

## 2019-01-23 ENCOUNTER — Ambulatory Visit: Payer: Self-pay | Admitting: Cardiology

## 2019-01-23 ENCOUNTER — Telehealth: Payer: Self-pay | Admitting: Cardiology

## 2019-01-23 ENCOUNTER — Ambulatory Visit (INDEPENDENT_AMBULATORY_CARE_PROVIDER_SITE_OTHER): Payer: MEDICARE | Admitting: Cardiology

## 2019-01-23 ENCOUNTER — Encounter: Payer: Self-pay | Admitting: Cardiology

## 2019-01-23 ENCOUNTER — Other Ambulatory Visit: Payer: Self-pay

## 2019-01-23 VITALS — BP 155/76 | HR 51 | Ht 60.0 in | Wt 110.0 lb

## 2019-01-23 DIAGNOSIS — R001 Bradycardia, unspecified: Secondary | ICD-10-CM

## 2019-01-23 DIAGNOSIS — I10 Essential (primary) hypertension: Secondary | ICD-10-CM

## 2019-01-23 DIAGNOSIS — R9431 Abnormal electrocardiogram [ECG] [EKG]: Secondary | ICD-10-CM

## 2019-01-23 DIAGNOSIS — R296 Repeated falls: Secondary | ICD-10-CM | POA: Diagnosis not present

## 2019-01-23 NOTE — Progress Notes (Signed)
Patient referred by Royals, [name], MD for evaluation of frequent falls, concern for abnormal EKG.  Subjective:   Travis Hanna, male    DOB: March 11, 1956, [age] y.o.   MRN: [id number]   Chief Complaint  Patient presents with  . Bradycardia  . New Patient (Initial Visit)    HPI  63 y.o. Caucasian male with Down's syndrome, dementia, h/o seizure, h/o stroke, hypertension, referred for evaluation of frequent falls, concern for abnormal EKG.  Patient is in a group home, is here today with his caregiver.  Patient is nonverbal and history is obtained from caregiver.  According to her, patient has had episodes of refusing certain activity, such as walking into shower, followed by falls, questionable seizure activity.  There is no clear loss of consciousness.  Patient is on carbamazepine for his history of seizures with supratherapeutic levels noted during ED admission prompting withdrawal of the medication.  This is recently resumed.  He has an echocardiogram from 2018 that showed structurally normal heart.  There is question of history of stroke, however recent CT head does not show any evidence of for stroke.  Reviewed ED notes over the last few months, along with vital signs and EKGs, as well as brain imaging.   Past Medical History:  Diagnosis Date  . Anemia   . Down syndrome   . MR (mental retardation)   . Myopia with astigmatism   . Osteoporosis      No past surgical history on file.   Social History   Socioeconomic History  . Marital status: Single    Spouse name: Not on file  . Number of children: Not on file  . Years of education: Not on file  . Highest education level: Not on file  Occupational History  . Not on file  Social Needs  . Financial resource strain: Not on file  . Food insecurity    Worry: Not on file    Inability: Not on file  . Transportation needs    Medical: Not on file    Non-medical: Not on file  Tobacco Use  . Smoking status: Never Smoker   . Smokeless tobacco: Never Used  Substance and Sexual Activity  . Alcohol use: No  . Drug use: No  . Sexual activity: Never  Lifestyle  . Physical activity    Days per week: Not on file    Minutes per session: Not on file  . Stress: Not on file  Relationships  . Social Musicianconnections    Talks on phone: Not on file    Gets together: Not on file    Attends religious service: Not on file    Active member of club or organization: Not on file    Attends meetings of clubs or organizations: Not on file    Relationship status: Not on file  . Intimate partner violence    Fear of current or ex partner: Not on file    Emotionally abused: Not on file    Physically abused: Not on file    Forced sexual activity: Not on file  Other Topics Concern  . Not on file  Social History Narrative  . Not on file     Family history: Not available.  Current Outpatient Medications on File Prior to Visit  Medication Sig Dispense Refill  . alendronate (FOSAMAX) 70 MG tablet Take 70 mg by mouth every Friday. Take with a full glass of water on an empty stomach.    Marland Kitchen. aspirin  EC 81 MG EC tablet Take 1 tablet (81 mg total) by mouth daily.    . Calcium Carbonate-Vitamin D 600-400 MG-UNIT tablet Take 1 tablet by mouth 2 (two) times daily.    . carBAMazepine (TEGRETOL) 100 MG/5ML suspension Take 200 mg by mouth 2 (two) times daily.     . cetaphil (CETAPHIL) lotion Apply 1 application topically daily. Apply topically to face daily     . enalapril (VASOTEC) 10 MG tablet Take 10 mg by mouth at bedtime.     Marland Kitchen esomeprazole (NEXIUM) 40 MG capsule Take 40 mg by mouth at bedtime.    Marland Kitchen ketoconazole (NIZORAL) 2 % shampoo Apply 1 application topically every Monday, Wednesday, and Friday.     . lactulose (CHRONULAC) 10 GM/15ML solution Take 15 g by mouth daily.     Marland Kitchen levothyroxine (SYNTHROID) 50 MCG tablet Take 50 mcg by mouth daily before breakfast.     . lubiprostone (AMITIZA) 24 MCG capsule Take 24 mcg by mouth 2 (two)  times a day.     . metroNIDAZOLE (METROGEL) 1 % gel Apply 1 application topically See admin instructions. Apply to face every evening for acne    . mometasone (ELOCON) 0.1 % cream Apply 1 application topically every other day. Apply sparingly to nasal bridge every other day    . polyvinyl alcohol (ARTIFICIAL TEARS) 1.4 % ophthalmic solution Place 1 drop into both eyes 3 (three) times daily.    Marland Kitchen Soap & Cleansers (PHISODERM NORMAL TO OILY EX) Apply 1 application topically daily. To face     No current facility-administered medications on file prior to visit.     Cardiovascular studies:  Echocardiogram 08/24/2016: - Left ventricle: The cavity size was normal. Systolic function was   normal. The estimated ejection fraction was in the range of 60%   to 65%. Although no diagnostic regional wall motion abnormality   was identified, this possibility cannot be completely excluded on   the basis of this study. - Tricuspid valve: There was mild-moderate regurgitation. - Pericardium, extracardiac: A small pericardial effusion was   identified circumferential to the heart.  Impressions:  - No cardiac source of embolism was identified, but cannot be ruled   out on the basis of this examination.  Carotid US 08/24/2016: Bilateral: mild mixed plaque origin ICA. 1-39% ICA plaquing. Vertebral artery flow is antegrade.   EKG 01/09/2019: Sinus rhythm 53 bpm. Nonspecific ST-T abnormality No change compared to previous EKG in 2018   Recent labs: Results for KALIK, ZECHER (MRN [id number]) as of 01/23/2019 17:03  Ref. Range 12/21/2018 08:05  Sodium Latest Ref Range: 135 - 145 mmol/L 139  Potassium Latest Ref Range: 3.5 - 5.1 mmol/L 4.1  Chloride Latest Ref Range: 98 - 111 mmol/L 104  CO2 Latest Ref Range: 22 - 32 mmol/L 27  Glucose Latest Ref Range: 70 - 99 mg/dL 93  BUN Latest Ref Range: 6 - 20 mg/dL 13  Creatinine Latest Ref Range: 0.61 - 1.24 mg/dL 8.20  Calcium Latest Ref Range: 8.9 -  10.3 mg/dL 8.5 (L)  Anion gap Latest Ref Range: 5 - 15  8  GFR, Est Non African American Latest Ref Range: >60 mL/min >60  GFR, Est African American Latest Ref Range: >60 mL/min >60   Results for ANDREN, WAITKUS (MRN [id number]) as of 01/23/2019 17:03  Ref. Range 12/21/2018 08:05  WBC Latest Ref Range: 4.0 - 10.5 K/uL 2.7 (L)  RBC Latest Ref Range: 4.22 - 5.81 MIL/uL 3.20 (L)  Hemoglobin Latest Ref Range: 13.0 - 17.0 g/dL 12.0 (L)  HCT Latest Ref Range: 39.0 - 52.0 % 37.2 (L)  MCV Latest Ref Range: 80.0 - 100.0 fL 116.3 (H)  MCH Latest Ref Range: 26.0 - 34.0 pg 37.5 (H)  MCHC Latest Ref Range: 30.0 - 36.0 g/dL 32.3  RDW Latest Ref Range: 11.5 - 15.5 % 14.4  Platelets Latest Ref Range: 150 - 400 K/uL 198  nRBC Latest Ref Range: 0.0 - 0.2 % 0.0   Results for JABARIE, POP (MRN [id number]) as of 01/23/2019 17:03  Ref. Range 11/07/2018 12:03 11/10/2018 00:42 12/06/2018 10:59 12/21/2018 08:05  Carbamazepine (Tegretol), S Latest Ref Range: 4.0 - 12.0 ug/mL <2.0 (L) 23.5 (HH) 14.4 (H) 7.3    Review of Systems  Unable to perform ROS: patient nonverbal         Vitals:   01/23/19 1336  BP: (!) 155/76  Pulse: (!) 51  SpO2: 98%     Body mass index is 21.48 kg/m. Filed Weights   01/23/19 1336  Weight: 110 lb (49.9 kg)     Objective:   Physical Exam  Constitutional: He appears well-developed and well-nourished. No distress.  Non-verbal. Sitting in a wheelchair  HENT:  Head: Normocephalic and atraumatic.  Eyes: Pupils are equal, round, and reactive to light. Conjunctivae are normal.  Neck: No JVD present.  Cardiovascular: Normal rate, regular rhythm and intact distal pulses.  No murmur heard. Pulmonary/Chest: Effort normal and breath sounds normal. He has no wheezes. He has no rales.  Abdominal: Soft. Bowel sounds are normal. There is no rebound.  Musculoskeletal:        General: No edema.  Lymphadenopathy:    He has no cervical adenopathy.  Neurological: He is alert.   Deferred to baseline mental status  Skin: Skin is warm and dry.  Psychiatric:  Unable to assess  Nursing note and vitals reviewed.         Assessment & Recommendations:   63 y.o. Caucasian male with Down's syndrome, mental retardation, dementia, h/o seizure, h/o stroke, hypertension, referred for evaluation of frequent falls, concern for abnormal EKG.  Bradycardia and borderline EKG: This was primary reason for referral.  I suspect his borderline low heart rate is due to his ongoing use of carbamazepine.  I do not see any AV conduction or intraventricular conduction abnormalities.  I do not think his frequent falls are in any way related to his EKG findings.  I suspect either seizure related or behavioral issues leading to his falls.  I do not think any noninvasive or invasive cardiac testing will be high yield.   Hypertension: BP elevated today. Defer management to patient's PCP.  Thank you for referring the patient to [place]. Please feel free to contact with any questions.  Nigel Mormon, MD [facility] [place] Pager: [phone] Office: [phone] If no answer Cell [phone]

## 2019-01-23 NOTE — Telephone Encounter (Signed)
I have reviewed patients EKG in the ED on 01/09/2019. I do not see any significant abnormalities. If no other complaints, patient does not need cardiology evaluation.  Vernell Leep, MD

## 2019-02-05 ENCOUNTER — Emergency Department (HOSPITAL_COMMUNITY): Payer: MEDICARE

## 2019-02-05 ENCOUNTER — Emergency Department (HOSPITAL_COMMUNITY)
Admission: EM | Admit: 2019-02-05 | Discharge: 2019-02-05 | Disposition: A | Discharge status: Home / Self Care | Payer: MEDICARE | Attending: Emergency Medicine | Admitting: Emergency Medicine

## 2019-02-05 ENCOUNTER — Other Ambulatory Visit: Payer: Self-pay

## 2019-02-05 ENCOUNTER — Encounter (HOSPITAL_COMMUNITY): Payer: Self-pay

## 2019-02-05 DIAGNOSIS — Y999 Unspecified external cause status: Secondary | ICD-10-CM | POA: Insufficient documentation

## 2019-02-05 DIAGNOSIS — S0121XA Laceration without foreign body of nose, initial encounter: Secondary | ICD-10-CM | POA: Diagnosis not present

## 2019-02-05 DIAGNOSIS — S022XXA Fracture of nasal bones, initial encounter for closed fracture: Secondary | ICD-10-CM | POA: Insufficient documentation

## 2019-02-05 DIAGNOSIS — Z7982 Long term (current) use of aspirin: Secondary | ICD-10-CM | POA: Insufficient documentation

## 2019-02-05 DIAGNOSIS — F79 Unspecified intellectual disabilities: Secondary | ICD-10-CM | POA: Diagnosis not present

## 2019-02-05 DIAGNOSIS — W19XXXA Unspecified fall, initial encounter: Secondary | ICD-10-CM

## 2019-02-05 DIAGNOSIS — E039 Hypothyroidism, unspecified: Secondary | ICD-10-CM | POA: Diagnosis not present

## 2019-02-05 DIAGNOSIS — Z79899 Other long term (current) drug therapy: Secondary | ICD-10-CM | POA: Diagnosis not present

## 2019-02-05 DIAGNOSIS — Y939 Activity, unspecified: Secondary | ICD-10-CM | POA: Insufficient documentation

## 2019-02-05 DIAGNOSIS — W1830XA Fall on same level, unspecified, initial encounter: Secondary | ICD-10-CM | POA: Insufficient documentation

## 2019-02-05 DIAGNOSIS — F0390 Unspecified dementia without behavioral disturbance: Secondary | ICD-10-CM | POA: Diagnosis not present

## 2019-02-05 DIAGNOSIS — Y92193 Bedroom in other specified residential institution as the place of occurrence of the external cause: Secondary | ICD-10-CM | POA: Insufficient documentation

## 2019-02-05 DIAGNOSIS — Q909 Down syndrome, unspecified: Secondary | ICD-10-CM | POA: Diagnosis not present

## 2019-02-05 DIAGNOSIS — I10 Essential (primary) hypertension: Secondary | ICD-10-CM | POA: Diagnosis not present

## 2019-02-05 MED ORDER — LORAZEPAM 1 MG PO TABS: 0.5000 mg | ORAL_TABLET | Freq: Once | ORAL | Status: DC

## 2019-02-05 MED ORDER — HALOPERIDOL LACTATE 5 MG/ML IJ SOLN
2.0000 mg | Freq: Once | INTRAMUSCULAR | Status: AC
  Administered 2019-02-05: 14:00:00 2 mg via INTRAMUSCULAR
  Filled 2019-02-05: qty 1

## 2019-02-05 MED ORDER — LIDOCAINE-EPINEPHRINE-TETRACAINE (LET) TOPICAL GEL
3.0000 mL | Freq: Once | TOPICAL | Status: AC
  Administered 2019-02-05: 3 mL via TOPICAL
  Filled 2019-02-05 (×2): qty 3

## 2019-02-05 MED ORDER — LORAZEPAM 2 MG/ML IJ SOLN
0.5000 mg | Freq: Once | INTRAMUSCULAR | Status: AC
  Administered 2019-02-05: 13:00:00 0.5 mg via INTRAMUSCULAR
  Filled 2019-02-05: qty 1

## 2019-02-05 MED ORDER — ACETAMINOPHEN 325 MG PO TABS
650.0000 mg | ORAL_TABLET | Freq: Once | ORAL | Status: AC
  Administered 2019-02-05: 10:00:00 650 mg via ORAL
  Filled 2019-02-05: qty 2

## 2019-02-05 NOTE — ED Provider Notes (Signed)
[facility] EMERGENCY DEPARTMENT Provider Note   CSN: [id number] Arrival date & time: 02/05/19  4098     History   Chief Complaint Chief Complaint  Patient presents with   Fall    HPI LARS JEZIORSKI is a 63 y.o. male.     The history is provided by medical records, the EMS personnel and a caregiver. No language interpreter was used.   JARYN ROSKO is a 63 y.o. male who presents to the Emergency Department complaining of fall.  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  Hx is provided by EMS and his group home, [facility].  He was in his room this morning when he had an unwitnessed fall.  He struck his face and knees.  The fall occurred at 0600.  He has a hx/o recurrent falls.    QT Bobbye Riggs, [phone] Past Medical History:  Diagnosis Date   Anemia    Down syndrome    MR (mental retardation)    Myopia with astigmatism    Osteoporosis     Patient Active Problem List   Diagnosis Date Noted   Nonspecific abnormal electrocardiogram (ECG) (EKG) 01/23/2019   Frequent falls 01/23/2019   Essential hypertension 01/23/2019   Bradycardia 01/23/2019   CVA (cerebral vascular accident) (HCC) 08/25/2016   Right leg weakness 08/23/2016   Hypothyroidism 06/26/2013   Ileus (HCC) 06/25/2013   Abdominal pain, unspecified site 06/25/2013   Down syndrome 06/25/2013   Constipation 06/25/2013    History reviewed. No pertinent surgical history.      Home Medications    Prior to Admission medications   Medication Sig Start Date End Date Taking? Authorizing Provider  alendronate (FOSAMAX) 70 MG tablet Take 70 mg by mouth every Friday. Take with a full glass of water on an empty stomach.    [provider]  aspirin EC 81 MG EC tablet Take 1 tablet (81 mg total) by mouth daily. 08/26/16   Leatha Gilding, MD  Calcium Carbonate-Vitamin D 600-400 MG-UNIT tablet Take 1 tablet by mouth 2 (two) times daily.    [provider]  carbamazepine (TEGRETOL) 100 MG chewable tablet Chew 1 tablet by mouth daily. 01/07/19   [provider]  carBAMazepine (TEGRETOL) 100 MG/5ML suspension Take 200 mg by mouth 2 (two) times daily.     [provider]  cetaphil (CETAPHIL) lotion Apply 1 application topically daily. Apply topically to face daily     [provider]  enalapril (VASOTEC) 10 MG tablet Take 10 mg by mouth at bedtime.     [provider]  esomeprazole (NEXIUM) 40 MG capsule Take 40 mg by mouth at bedtime.    [provider]  ketoconazole (NIZORAL) 2 % shampoo Apply 1 application topically every Monday, Wednesday, and Friday.     [provider]  lactulose (CHRONULAC) 10 GM/15ML solution Take 15 g by mouth daily.     [provider]  levothyroxine (SYNTHROID) 50 MCG tablet Take 50 mcg by mouth daily before breakfast.     [provider]  lubiprostone (AMITIZA) 24 MCG capsule Take 24 mcg by mouth 2 (two) times a day.     [provider]  metroNIDAZOLE (METROGEL) 1 % gel Apply 1 application topically See admin instructions. Apply to face every evening for acne    [provider]  mometasone (ELOCON) 0.1 % cream Apply 1 application topically every other day. Apply sparingly to nasal bridge every other day  [provider]  polyvinyl alcohol (ARTIFICIAL TEARS) 1.4 % ophthalmic solution Place 1 drop into both eyes 3 (three) times daily.    [provider]  Soap & Cleansers (PHISODERM NORMAL TO OILY EX) Apply 1 application topically daily. To face    [provider]    Family History History reviewed. No pertinent family history.  Social History Social History   Tobacco Use   Smoking status: Never Smoker   Smokeless tobacco: Never Used  Substance Use Topics   Alcohol use: No   Drug use: No     Allergies   Carbapenems, Cephalosporins, Other, Penicillamine, and Penicillins   Review  of Systems Review of Systems  Unable to perform ROS: Dementia     Physical Exam Updated Vital Signs BP 128/85    Pulse 83    Temp 97.8 F (36.6 C) (Axillary)    Resp 15    SpO2 94%   Physical Exam Vitals signs and nursing note reviewed.  Constitutional:      Appearance: He is well-developed.  HENT:     Head: Normocephalic.     Comments: Laceration to nasal bridge with large amount of dried blood on the face.   Eyes:     Comments: Left eye deviated medially.  Tracks with right eye.   Neck:     Comments: ccollar in place Cardiovascular:     Rate and Rhythm: Normal rate and regular rhythm.     Heart sounds: No murmur.  Pulmonary:     Effort: Pulmonary effort is normal. No respiratory distress.     Breath sounds: Normal breath sounds.  Abdominal:     Palpations: Abdomen is soft.     Tenderness: There is no abdominal tenderness. There is no guarding or rebound.  Musculoskeletal:        General: No tenderness.  Skin:    General: Skin is warm and dry.  Neurological:     Mental Status: He is alert.     Comments: Minimally verbal.  States no.  Does not follow commands.  Moves all four extremities.    Psychiatric:     Comments: Mildly agitated      ED Treatments / Results  Labs (all labs ordered are listed, but only abnormal results are displayed) Labs Reviewed - No data to display  EKG None  Radiology Ct Head Wo Contrast  Result Date: 02/05/2019 CLINICAL DATA:  Minor head trauma. EXAM: CT HEAD WITHOUT CONTRAST CT MAXILLOFACIAL WITHOUT CONTRAST CT CERVICAL SPINE WITHOUT CONTRAST TECHNIQUE: Multidetector CT imaging of the head, cervical spine, and maxillofacial structures were performed using the standard protocol without intravenous contrast. Multiplanar CT image reconstructions of the cervical spine and maxillofacial structures were also generated. COMPARISON:  December 21, 2018 FINDINGS: CT HEAD FINDINGS Brain: Signs of generalized atrophy is similar to the prior study.  Subtle basal ganglia calcifications are unchanged as is a third ventricle colloid cyst. No signs of acute infarct, hydrocephalus, mass or mass effect. No signs of intracranial hemorrhage. Vascular: No hyperdense vessel or unexpected calcification. Skull: Fractures of the nasal bone bilaterally. Displaced fracture of the septum. Frothy secretions and bilateral hemosinus with small amount of hyperdense material layering in the maxillary sinuses marked displacement of the nasal bone, see CT of the face for further detail. Zygomatic arch is grossly intact. No signs of mastoid effusion or fracture of the calvarium. Other: None. CT MAXILLOFACIAL FINDINGS Osseous: Bilateral nasal bone fractures and fracture of the nasal septum deviated from left to right  more so than on the prior study with acute angulation in the midportion. No signs of orbital fracture. Bilateral zygomatic arches are intact Pterygoids are unremarkable. Mandible is intact. Orbits: No signs of orbital fracture. No retro bulbar fat stranding. Sinuses: Mild hemosinus in right maxillary and ethmoid sinuses in a setting fracture of the bony nasal septum. Soft tissues: Soft tissue stranding over the nasal bone. CT CERVICAL SPINE FINDINGS Alignment: Alignment is unchanged compared to the previous exam with multilevel degenerative changes seen throughout the cervical spine. Skull base and vertebrae: No acute fracture. No primary bone lesion or focal pathologic process. Extensive degenerative changes are unchanged. Soft tissues and spinal canal: No prevertebral fluid or swelling. No visible canal hematoma. Disc levels:  Multilevel degenerative changes, as before. Upper chest: Negative. Other: None IMPRESSION: 1. No acute intracranial findings. 2. Displaced nasal bone fractures and fracture of the bony nasal septum with mild hemosinus. 3. Extensive, multilevel spinal degenerative change without fracture. Electronically Signed   By: Donzetta Kohut M.D.   On:  02/05/2019 09:50   Ct Cervical Spine Wo Contrast  Result Date: 02/05/2019 CLINICAL DATA:  Minor head trauma. EXAM: CT HEAD WITHOUT CONTRAST CT MAXILLOFACIAL WITHOUT CONTRAST CT CERVICAL SPINE WITHOUT CONTRAST TECHNIQUE: Multidetector CT imaging of the head, cervical spine, and maxillofacial structures were performed using the standard protocol without intravenous contrast. Multiplanar CT image reconstructions of the cervical spine and maxillofacial structures were also generated. COMPARISON:  December 21, 2018 FINDINGS: CT HEAD FINDINGS Brain: Signs of generalized atrophy is similar to the prior study. Subtle basal ganglia calcifications are unchanged as is a third ventricle colloid cyst. No signs of acute infarct, hydrocephalus, mass or mass effect. No signs of intracranial hemorrhage. Vascular: No hyperdense vessel or unexpected calcification. Skull: Fractures of the nasal bone bilaterally. Displaced fracture of the septum. Frothy secretions and bilateral hemosinus with small amount of hyperdense material layering in the maxillary sinuses marked displacement of the nasal bone, see CT of the face for further detail. Zygomatic arch is grossly intact. No signs of mastoid effusion or fracture of the calvarium. Other: None. CT MAXILLOFACIAL FINDINGS Osseous: Bilateral nasal bone fractures and fracture of the nasal septum deviated from left to right more so than on the prior study with acute angulation in the midportion. No signs of orbital fracture. Bilateral zygomatic arches are intact Pterygoids are unremarkable. Mandible is intact. Orbits: No signs of orbital fracture. No retro bulbar fat stranding. Sinuses: Mild hemosinus in right maxillary and ethmoid sinuses in a setting fracture of the bony nasal septum. Soft tissues: Soft tissue stranding over the nasal bone. CT CERVICAL SPINE FINDINGS Alignment: Alignment is unchanged compared to the previous exam with multilevel degenerative changes seen throughout the  cervical spine. Skull base and vertebrae: No acute fracture. No primary bone lesion or focal pathologic process. Extensive degenerative changes are unchanged. Soft tissues and spinal canal: No prevertebral fluid or swelling. No visible canal hematoma. Disc levels:  Multilevel degenerative changes, as before. Upper chest: Negative. Other: None IMPRESSION: 1. No acute intracranial findings. 2. Displaced nasal bone fractures and fracture of the bony nasal septum with mild hemosinus. 3. Extensive, multilevel spinal degenerative change without fracture. Electronically Signed   By: Donzetta Kohut M.D.   On: 02/05/2019 09:50   Ct Maxillofacial Wo Cm  Result Date: 02/05/2019 CLINICAL DATA:  Minor head trauma. EXAM: CT HEAD WITHOUT CONTRAST CT MAXILLOFACIAL WITHOUT CONTRAST CT CERVICAL SPINE WITHOUT CONTRAST TECHNIQUE: Multidetector CT imaging of the head, cervical spine, and maxillofacial  structures were performed using the standard protocol without intravenous contrast. Multiplanar CT image reconstructions of the cervical spine and maxillofacial structures were also generated. COMPARISON:  December 21, 2018 FINDINGS: CT HEAD FINDINGS Brain: Signs of generalized atrophy is similar to the prior study. Subtle basal ganglia calcifications are unchanged as is a third ventricle colloid cyst. No signs of acute infarct, hydrocephalus, mass or mass effect. No signs of intracranial hemorrhage. Vascular: No hyperdense vessel or unexpected calcification. Skull: Fractures of the nasal bone bilaterally. Displaced fracture of the septum. Frothy secretions and bilateral hemosinus with small amount of hyperdense material layering in the maxillary sinuses marked displacement of the nasal bone, see CT of the face for further detail. Zygomatic arch is grossly intact. No signs of mastoid effusion or fracture of the calvarium. Other: None. CT MAXILLOFACIAL FINDINGS Osseous: Bilateral nasal bone fractures and fracture of the nasal septum  deviated from left to right more so than on the prior study with acute angulation in the midportion. No signs of orbital fracture. Bilateral zygomatic arches are intact Pterygoids are unremarkable. Mandible is intact. Orbits: No signs of orbital fracture. No retro bulbar fat stranding. Sinuses: Mild hemosinus in right maxillary and ethmoid sinuses in a setting fracture of the bony nasal septum. Soft tissues: Soft tissue stranding over the nasal bone. CT CERVICAL SPINE FINDINGS Alignment: Alignment is unchanged compared to the previous exam with multilevel degenerative changes seen throughout the cervical spine. Skull base and vertebrae: No acute fracture. No primary bone lesion or focal pathologic process. Extensive degenerative changes are unchanged. Soft tissues and spinal canal: No prevertebral fluid or swelling. No visible canal hematoma. Disc levels:  Multilevel degenerative changes, as before. Upper chest: Negative. Other: None IMPRESSION: 1. No acute intracranial findings. 2. Displaced nasal bone fractures and fracture of the bony nasal septum with mild hemosinus. 3. Extensive, multilevel spinal degenerative change without fracture. Electronically Signed   By: Donzetta Kohut M.D.   On: 02/05/2019 09:50    Procedures .Marland KitchenLaceration Repair  Date/Time: 02/05/2019 4:09 PM Performed by: Tilden Fossa, MD Authorized by: Tilden Fossa, MD   Consent:    Consent obtained:  Verbal   Consent given by:  Guardian Anesthesia (see MAR for exact dosages):    Anesthesia method:  Topical application   Topical anesthetic:  LET Laceration details:    Location: nasal bridge.   Length (cm):  1 Repair type:    Repair type:  Simple Exploration:    Contaminated: no   Treatment:    Area cleansed with:  Saline and Hibiclens   Amount of cleaning:  Standard   Irrigation solution:  Sterile saline Skin repair:    Repair method:  Tissue adhesive Approximation:    Approximation:  Close Post-procedure details:      Dressing:  Open (no dressing)   Patient tolerance of procedure:  Tolerated well, no immediate complications   (including critical care time)  Medications Ordered in ED Medications  lidocaine-EPINEPHrine-tetracaine (LET) topical gel (3 mLs Topical Given 02/05/19 1021)  acetaminophen (TYLENOL) tablet 650 mg (650 mg Oral Given 02/05/19 1019)  LORazepam (ATIVAN) injection 0.5 mg (0.5 mg Intramuscular Given 02/05/19 1302)  haloperidol lactate (HALDOL) injection 2 mg (2 mg Intramuscular Given 02/05/19 1330)     Initial Impression / Assessment and Plan / ED Course  I have reviewed the triage vital signs and the nursing notes.  Pertinent labs & imaging results that were available during my care of the patient were reviewed by me and considered in my  medical decision making (see chart for details).        Patient here for evaluation of injuries following a fall. Examination is challenging due to poor patient cooperation. He does have a laceration to his nasal bridge that was cleansed and closed with dermabond after treatment with Haldol and Ativan due to his agitation on trying to cleanse his face. Patient otherwise happy and interactive, smiling and cooperative. He is able to ambulate in the department with no clear evidence of pain to his hips, knees, legs. Discussed with patient's brother and power of attorney as well as nurse with [facility] patient's wounds and need for outpatient follow-up with ENT.  Final Clinical Impressions(s) / ED Diagnoses   Final diagnoses:  Fall, initial encounter  Laceration of nose, initial encounter  Closed fracture of nasal bone, initial encounter    ED Discharge Orders    None       Tilden Fossaees, Danille Oppedisano, MD 02/05/19 [phone]

## 2019-02-05 NOTE — ED Triage Notes (Signed)
Per [facility], pt is coming from a special needs group home. Group home reported they heard pt fall around 6 am this morning but did not witness the fall.

## 2019-02-05 NOTE — ED Notes (Signed)
Patient transported to CT 

## 2019-02-05 NOTE — Discharge Instructions (Addendum)
Travis Hanna has a broken nose. He should not blow his nose for the next week. He had skin glue placed over the wound on his nose. The glue will peel off over time. Do not put any ointments over the glue. It is okay to wash his face and pat the skin dry. He can take Tylenol, according to label instructions as needed for pain. He needs to follow up with Dr. Iran Planas about his broken nose next week, call for appointment.

## 2019-02-05 NOTE — ED Notes (Signed)
Pt very agitated, continues to rub nose and cause blood to run down face. Pt taking all clothes off and redirected to bed

## 2019-02-13 NOTE — H&P (Signed)
Subjective:     Patient ID: Travis Hanna is a 63 y.o. male.  HPI  Referred from [facility] ED for follow up facial injury DOI 9.30.20 following fall at group home onto bed. Patient with Travis Hanna syndrome, non verbal, history frequent falls and seizure disorder. Accompanied by home care giver who reports patient overall doing well, no bleeding from nose. Notes breathing is noisy.  CLINICAL DATA: Minor head trauma.  EXAM: CT HEAD WITHOUT CONTRAST  CT MAXILLOFACIAL WITHOUT CONTRAST  TECHNIQUE: Multidetector CT imaging of the head, cervical spine, and maxillofacial structures were performed using the standard protocol without intravenous contrast. Multiplanar CT image reconstructions of the cervical spine and maxillofacial structures were also generated.  COMPARISON: December 21, 2018  FINDINGS: CT HEAD FINDINGS  Brain: Signs of generalized atrophy is similar to the prior study. Subtle basal ganglia calcifications are unchanged as is a third ventricle colloid cyst. No signs of acute infarct, hydrocephalus, mass or mass effect. No signs of intracranial hemorrhage.  Vascular: No hyperdense vessel or unexpected calcification.  Skull: Fractures of the nasal bone bilaterally. Displaced fracture of the septum. Frothy secretions and bilateral hemosinus with small amount of hyperdense material layering in the maxillary sinuses marked displacement of the nasal bone, see CT of the face for further detail. Zygomatic arch is grossly intact.  No signs of mastoid effusion or fracture of the calvarium.  Other: None.  CT MAXILLOFACIAL FINDINGS  Osseous: Bilateral nasal bone fractures and fracture of the nasal septum deviated from left to right more so than on the prior study with acute angulation in the midportion.  No signs of orbital fracture.  Bilateral zygomatic arches are intact  Pterygoids are unremarkable.  Mandible is intact.  Orbits: No signs of orbital  fracture. No retro bulbar fat stranding.  Sinuses: Mild hemosinus in right maxillary and ethmoid sinuses in a setting fracture of the bony nasal septum.  Soft tissues: Soft tissue stranding over the nasal bone.  CT CERVICAL SPINE FINDINGS  Alignment: Alignment is unchanged compared to the previous exam with multilevel degenerative changes seen throughout the cervical spine.  Skull base and vertebrae: No acute fracture. No primary bone lesion or focal pathologic process. Extensive degenerative changes are unchanged.  Soft tissues and spinal canal: No prevertebral fluid or swelling. No visible canal hematoma.  Disc levels: Multilevel degenerative changes, as before.  Upper chest: Negative.  Other: None  IMPRESSION: 1. No acute intracranial findings. 2. Displaced nasal bone fractures and fracture of the bony nasal septum with mild hemosinus. 3. Extensive, multilevel spinal degenerative change without fracture.   Electronically Signed By: Zetta Bills M.D. On: 02/05/2019 09:50  Review of Systems     Objective:   Physical Exam  Cardiovascular: Normal rate and regular rhythm.  Pulmonary/Chest: Effort normal and breath sounds normal.  Neurological:  Non verbal but cooperates with exam  Physical Exam  Cardiovascular: Normal rate and regular rhythm.   Pulmonary/Chest Effort normal and breath sounds normal.   HEENT: scab nasal dorsum, mild depression nasal bone, intranasal without septal hematoma, septum deviated occluding right nostril, resolving ecchymoses face     Assessment:     Nasal bone fracture Septal fracture    Plan:        Spoke with guardian [name] via phone. Reviewed injuries and typical recommendation for closed reduction with internal and external splints. Reviewed purpose of splints to stabilize reduction, that often sewn into place and if patient attempted to remove would cause greater injury. Reviewed not  dangerous  to not intervene surgically, but near complete obstruction on right and suspect difficult to breathe, in setting any upper respiratory infections may be even more difficult. Also discussed in setting multiple falls, additional falls in perioperative period would result in recurrence. Brother would like to try reduction. States due to COVID restriction he himself not able to see patient, also reports he is high risk himself and would not be present at hospital for surgery.  Plan closed reduction nose and septum. Given above will defer any splints, may still need packing for drainage. Plan OR in next week or two. Plan consent with guardian over phone. Plan OP surgery.  Glenna Fellows, MD [facility] [phone], pin [phone]

## 2019-02-14 ENCOUNTER — Other Ambulatory Visit: Payer: Self-pay

## 2019-02-14 ENCOUNTER — Encounter (HOSPITAL_COMMUNITY): Payer: Self-pay | Admitting: *Deleted

## 2019-02-14 NOTE — Anesthesia Preprocedure Evaluation (Addendum)
Anesthesia Evaluation  Patient identified by MRN, date of birth, ID band Patient awake    Reviewed: Allergy & Precautions, NPO status , Patient's Chart, lab work & pertinent test results  Airway Mallampati: II  TM Distance: >3 FB Neck ROM: Full    Dental  (+) Dental Advisory Given   Pulmonary neg pulmonary ROS,    Pulmonary exam normal breath sounds clear to auscultation       Cardiovascular hypertension, negative cardio ROS Normal cardiovascular exam Rhythm:Regular Rate:Normal     Neuro/Psych Seizures -,  PSYCHIATRIC DISORDERS CVA    GI/Hepatic negative GI ROS, Neg liver ROS,   Endo/Other  Hypothyroidism   Renal/GU negative Renal ROS     Musculoskeletal  (+) Arthritis ,   Abdominal   Peds  Hematology  (+) Blood dyscrasia, anemia ,   Anesthesia Other Findings   Reproductive/Obstetrics                            Anesthesia Physical Anesthesia Plan  ASA: III  Anesthesia Plan: General   Post-op Pain Management:    Induction: Inhalational  PONV Risk Score and Plan:   Airway Management Planned:   Additional Equipment:   Intra-op Plan:   Post-operative Plan: Extubation in OR  Informed Consent: I have reviewed the patients History and Physical, chart, labs and discussed the procedure including the risks, benefits and alternatives for the proposed anesthesia with the patient or authorized representative who has indicated his/her understanding and acceptance.     Dental advisory given  Plan Discussed with: CRNA  Anesthesia Plan Comments: (See PAT note written 02/14/2019 by Myra Gianotti, PA-C. SAME DAY WORK-UP  Down Syndrome, intellectual disability with limited communication. Frequent falls. Saw Dr. Earnie Larsson for 01/2019 for bradycardia, but not felt to be contributing to falls.   )       Anesthesia Quick Evaluation

## 2019-02-14 NOTE — Progress Notes (Signed)
Anesthesia Chart Review: SAME DAY WORK-UP   Case: [id number] Date/Time: 02/18/19 1130   Procedure: CLOSED REDUCTION NASAL FRACTURE (N/A Nose)   Anesthesia type: General   Pre-op diagnosis: closed nasal bone fracture closed septum fracture   Location: [facility] OR ROOM 11 / [place] [facility]   Surgeon: Irene Limbo, MD      DISCUSSION: Patient is a 63 year old male scheduled for the above procedure. He had an unwitnessed fall on 02/05/19 and sustained a nasal fracture. He is a same day work-up. He lives at [place].    History includes Down syndrome, intellectual disability (with limited verbal communication), HTN (enalapril recently on hold due to hypotension), seizures, hypothyroidism, anemia, myopia, never smoker, frequent falls. Possible CVA (08/23/16 RLE, CT "subtle hypodensity LEFT periventricular white matter could represent developing nonhemorrhagic infarct"; unable to do MRI due to uncooperativeness, at baseline 08/25/16 and discharged). S/p left inguinal hernia repair 10/27/15.   - Unwitnessed fall 9/30/120 with CT showing displaced nasal bone fracture and of the bony nasal septum. Dermabond was used to close a nasal bridge laceration and out-patient ENT follow-up recommended (ED referred Plastics Dr. Iran Planas).   - Cardiology evaluation by Dr. Virgina Jock on 01/23/19 for bradycardia with history of falls. He reviewed previous EKGs and 2018 echocardiogram. He wrote, " suspect his borderline low heart rate is due to his ongoing use of carbamazepine.  I do not see any AV conduction or intraventricular conduction abnormalities.  I do not think his frequent falls are in any way related to his EKG findings.  I suspect either seizure related or behavioral issues leading to his falls.  I do not think any noninvasive or invasive cardiac testing will be high yield." Return to cardiology if symptoms worsen or fail to improve, otherwise on-going PCP follow-up recommended.  PAT nursing staff are following up on  guardianship paper and consent. Dr. Iran Planas did speak guardian (as outlined in her 02/10/19 note). Patient will get day of surgery COVID-19 testing. I did not see any flexion/extension neck films, but multiple CT scans of the neck--which are summarized below in IMAGING section.   VS:  Wt Readings from Last 3 Encounters:  01/23/19 49.9 kg  12/06/18 56.3 kg  11/09/18 56.3 kg   Temp Readings from Last 3 Encounters:  02/05/19 36.6 C (Axillary)  01/09/19 36.6 C (Oral)  12/20/18 36.9 C (Oral)   BP Readings from Last 3 Encounters:  02/05/19 (!) 141/71  01/23/19 (!) 155/76  01/09/19 112/72   Pulse Readings from Last 3 Encounters:  02/05/19 62  01/23/19 (!) 51  01/09/19 72    PROVIDERS: [place], [place] is PCP Vernell Leep, MD is cardiologist   LABS: Labs per anesthesia. As of 12/21/18: Cr 0.92. Glucose 93. H/H 12.0/37.2, WBC 2.7, PLT 198K. Tegretol 7.3.    IMAGES: CT head/maxillofacial 02/05/19: IMPRESSION: 1. No acute intracranial findings. 2. Displaced nasal bone fractures and fracture of the bony nasal septum with mild hemosinus.   CT C-spine: FINDINGS 02/05/19: - Alignment: Alignment is unchanged compared to the previous exam with multilevel degenerative changes seen throughout the cervical spine. - Skull base and vertebrae: No acute fracture. No primary bone lesion or focal pathologic process. Extensive degenerative changes are unchanged. - Soft tissues and spinal canal: No prevertebral fluid or swelling. No visible canal hematoma. - Disc levels:  Multilevel degenerative changes, as before. FINDINGS 10/14/18:  - Alignment: Unchanged straightening of the normal cervical lordosis. Unchanged grade 1 retrolisthesis of C3 on C4. Mildly increased grade 1 anterolisthesis  of C7 on T1, likely degenerative. - Skull base and vertebrae: No acute fracture or suspicious osseous lesion. C1-2 arthropathy, asymmetrically severe on the left. Unchanged mild basilar impression. -  Soft tissues and spinal canal: No prevertebral fluid or swelling. No visible canal hematoma. - Disc levels: Diffuse cervical disc degeneration with severe disc space narrowing from C2-3-C6-7 and milder narrowing at C7-T1. Multilevel facet arthrosis, severe on the right at C4-5 where there is facet ankylosis. 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 Moderate to severe right-sided neural foraminal stenosis at C3-4 and C4-5. FINDINGS 11/28/16 [facility] Care Everywhere): - No cervical spine fracture. Congenital nonunion posterior C1 ring. - Mild cranial settling C1 ring on dens. Transverse ligament hypertrophy. Mild impression upon the ventral aspect of cervicomedullary junction. No excessive space between the anterior C1 ring and the dens in neutral position. There may be excessive motion at this level given patient's history of Down's syndrome (associated with ligamentum laxity).   1V CXR 11/10/18: FINDINGS: The heart size and mediastinal contours are within normal limits. Both lungs are clear. The visualized skeletal structures are unremarkable. IMPRESSION: No active disease.   EKG: 01/09/19: Sinus rhythm Nonspecific ST abnormality No significant change since last tracing Confirmed by Cathren Laine ([id number]) on 01/09/2019 5:49:25 PM   CV: Echo 08/24/16: Study Conclusions - Left ventricle: The cavity size was normal. Systolic function was   normal. The estimated ejection fraction was in the range of 60%   to 65%. Although no diagnostic regional wall motion abnormality   was identified, this possibility cannot be completely excluded on   the basis of this study. - Tricuspid valve: There was mild-moderate regurgitation. - Pericardium, extracardiac: A small pericardial effusion was   identified circumferential to the heart. Impressions: - No cardiac source of embolism was identified, but cannot be ruled   out on  the basis of this examination.   Carotid US 08/24/16: Summary: Bilateral: mild mixed plaque origin ICA. 1-39% ICA plaquing. Vertebral artery flow is antegrade.   Past Medical History:  Diagnosis Date  . Anemia   . Arthritis   . Down syndrome   . Hypertension    now with hypotension  . Hypothyroidism   . MR (mental retardation)   . Myopia with astigmatism   . Osteoporosis   . Seizure disorder [facility])     History reviewed. No pertinent surgical history.  MEDICATIONS: No current facility-administered medications for this encounter.    Marland Kitchen alendronate (FOSAMAX) 70 MG tablet  . aspirin EC 81 MG EC tablet  . Calcium Carbonate-Vitamin D 600-400 MG-UNIT tablet  . carbamazepine (TEGRETOL) 100 MG chewable tablet  . cetaphil (CETAPHIL) lotion  . Cholecalciferol (VITAMIN D) 50 MCG (2000 UT) tablet  . esomeprazole (NEXIUM) 40 MG capsule  . Glycerin-Hypromellose-PEG 400 (VISINE TEARS) 0.2-0.36-1 % SOLN  . ketoconazole (NIZORAL) 2 % shampoo  . lactulose (CHRONULAC) 10 GM/15ML solution  . levothyroxine (SYNTHROID) 50 MCG tablet  . lubiprostone (AMITIZA) 24 MCG capsule  . metroNIDAZOLE (METROGEL) 1 % gel  . mometasone (ELOCON) 0.1 % cream  . oxymetazoline (AFRIN) 0.05 % nasal spray  . Soap & Cleansers (PHISODERM NORMAL TO OILY EX)    Shonna Chock, PA-C Surgical Short Stay/Anesthesiology [facility] Phone [phone] [facility] Phone [phone] 02/14/2019 6:38 PM

## 2019-02-14 NOTE — Progress Notes (Signed)
Pt is a resident at a [place] group home. I spoke with Vivia Ewing, LPN for pre-op call. She states pt saw Dr. Virgina Jock on 01/23/19 because of an abnormal EKG and that his BP was running low. States he was falling more often. Dr. Virgina Jock reviewed EKG and Echo that was done in the past and did not feel that pt needed further workup. Anderson Malta states pt's Enalapril has been stopped due to pt being hypotensive. Pt has Down's syndrome and has limited speech, can say a few small words.  Pt will need Covid test done day of surgery. Anderson Malta states that there as not been anyone at the group home that has had Covid.   Anderson Malta told me that Dr. Para Skeans office had gotten verbal consent from patient's legal guardian for the surgery. I called Dr. Para Skeans office and they thought the group home had legal guardianship and they have not gotten any consent. Anderson Malta is to fax me the guardianship papers.  I faxed pre-op instructions to [facility] at [phone].

## 2019-02-14 NOTE — Pre-Procedure Instructions (Signed)
   ANVAY TENNIS  02/14/2019    Mr. Llera' procedure is scheduled on Tuesday, 02/18/19 at 11:30 AM.   Report to [facility] Entrance "A" Admitting Office at 8:30 AM.   Call this number if you have problems the morning of surgery: [phone]   Remember:  Patient is not to eat or drink after midnight Monday, 02/17/19.  Take these medicines the morning of surgery with A SIP OF WATER: NONE    Do not wear jewelry.  Do not wear lotions, powders, cologne or deodorant.  Men may shave face and neck.  Do not bring valuables to the hospital.  [facility] is not responsible for any belongings or valuables.  Contacts, dentures or bridgework may not be worn into surgery.    Patients discharged the day of surgery will not be allowed to drive home.   Any questions today or Monday, please call me, Lilia Pro, RN at [phone].

## 2019-02-17 NOTE — Progress Notes (Signed)
Called and left message for pt's brother, Gustabo Gordillo who is pt's legal guardian to return my call. Need to make sure he is aware of pt having surgery and will need to call him in the AM to get a phone consent done.

## 2019-02-17 NOTE — Progress Notes (Signed)
Spoke with pt's legal guardian, Jaivon Vanbeek and he states he will be available in the AM to do a telephone consent. His number is [phone]. Guardianship papers are in pt's chart.

## 2019-02-18 ENCOUNTER — Encounter (HOSPITAL_COMMUNITY): Payer: Self-pay | Admitting: *Deleted

## 2019-02-18 ENCOUNTER — Ambulatory Visit (HOSPITAL_COMMUNITY): Payer: MEDICARE | Admitting: Vascular Surgery

## 2019-02-18 ENCOUNTER — Encounter (HOSPITAL_COMMUNITY)
Admission: RE | Disposition: A | Discharge status: Home / Self Care | Payer: Self-pay | Source: Home / Self Care | Attending: Plastic Surgery

## 2019-02-18 ENCOUNTER — Ambulatory Visit (HOSPITAL_COMMUNITY)
Admission: RE | Admit: 2019-02-18 | Discharge: 2019-02-18 | Disposition: A | Discharge status: Home / Self Care | Payer: MEDICARE | Attending: Plastic Surgery | Admitting: Plastic Surgery

## 2019-02-18 ENCOUNTER — Other Ambulatory Visit: Payer: Self-pay

## 2019-02-18 DIAGNOSIS — Z79899 Other long term (current) drug therapy: Secondary | ICD-10-CM | POA: Diagnosis not present

## 2019-02-18 DIAGNOSIS — R296 Repeated falls: Secondary | ICD-10-CM | POA: Insufficient documentation

## 2019-02-18 DIAGNOSIS — W06XXXA Fall from bed, initial encounter: Secondary | ICD-10-CM | POA: Insufficient documentation

## 2019-02-18 DIAGNOSIS — Z7982 Long term (current) use of aspirin: Secondary | ICD-10-CM | POA: Insufficient documentation

## 2019-02-18 DIAGNOSIS — Z881 Allergy status to other antibiotic agents status: Secondary | ICD-10-CM | POA: Insufficient documentation

## 2019-02-18 DIAGNOSIS — G40909 Epilepsy, unspecified, not intractable, without status epilepticus: Secondary | ICD-10-CM | POA: Insufficient documentation

## 2019-02-18 DIAGNOSIS — Z880 Allergy status to penicillin: Secondary | ICD-10-CM | POA: Diagnosis not present

## 2019-02-18 DIAGNOSIS — Z20828 Contact with and (suspected) exposure to other viral communicable diseases: Secondary | ICD-10-CM | POA: Insufficient documentation

## 2019-02-18 DIAGNOSIS — E039 Hypothyroidism, unspecified: Secondary | ICD-10-CM | POA: Insufficient documentation

## 2019-02-18 DIAGNOSIS — M1990 Unspecified osteoarthritis, unspecified site: Secondary | ICD-10-CM | POA: Diagnosis not present

## 2019-02-18 DIAGNOSIS — Z7983 Long term (current) use of bisphosphonates: Secondary | ICD-10-CM | POA: Insufficient documentation

## 2019-02-18 DIAGNOSIS — S022XXA Fracture of nasal bones, initial encounter for closed fracture: Secondary | ICD-10-CM | POA: Insufficient documentation

## 2019-02-18 DIAGNOSIS — Y92092 Bedroom in other non-institutional residence as the place of occurrence of the external cause: Secondary | ICD-10-CM | POA: Diagnosis not present

## 2019-02-18 DIAGNOSIS — F79 Unspecified intellectual disabilities: Secondary | ICD-10-CM | POA: Insufficient documentation

## 2019-02-18 DIAGNOSIS — I10 Essential (primary) hypertension: Secondary | ICD-10-CM | POA: Diagnosis not present

## 2019-02-18 DIAGNOSIS — J342 Deviated nasal septum: Secondary | ICD-10-CM | POA: Insufficient documentation

## 2019-02-18 DIAGNOSIS — Z79890 Hormone replacement therapy (postmenopausal): Secondary | ICD-10-CM | POA: Insufficient documentation

## 2019-02-18 DIAGNOSIS — Q909 Down syndrome, unspecified: Secondary | ICD-10-CM | POA: Insufficient documentation

## 2019-02-18 HISTORY — DX: Hypothyroidism, unspecified: E03.9

## 2019-02-18 HISTORY — DX: Repeated falls: R29.6

## 2019-02-18 HISTORY — DX: Unspecified osteoarthritis, unspecified site: M19.90

## 2019-02-18 HISTORY — PX: CLOSED REDUCTION NASAL FRACTURE: SHX5365

## 2019-02-18 HISTORY — DX: Essential (primary) hypertension: I10

## 2019-02-18 HISTORY — DX: Epilepsy, unspecified, not intractable, without status epilepticus: G40.909

## 2019-02-18 LAB — SARS CORONAVIRUS 2 BY RT PCR (HOSPITAL ORDER, PERFORMED IN ~~LOC~~ HOSPITAL LAB): SARS Coronavirus 2: NEGATIVE

## 2019-02-18 SURGERY — CLOSED REDUCTION, FRACTURE, NASAL BONE
Anesthesia: General | Site: Nose

## 2019-02-18 MED ORDER — LIDOCAINE-EPINEPHRINE 1 %-1:100000 IJ SOLN
INTRAMUSCULAR | Status: DC | PRN
  Administered 2019-02-18: 5 mL

## 2019-02-18 MED ORDER — CLINDAMYCIN PHOSPHATE 900 MG/50ML IV SOLN
INTRAVENOUS | Status: AC
  Filled 2019-02-18: qty 50

## 2019-02-18 MED ORDER — CLINDAMYCIN PHOSPHATE 900 MG/50ML IV SOLN
900.0000 mg | INTRAVENOUS | Status: AC
  Administered 2019-02-18: 12:00:00 900 mg via INTRAVENOUS

## 2019-02-18 MED ORDER — MIDAZOLAM HCL 2 MG/2ML IJ SOLN
INTRAMUSCULAR | Status: AC
  Filled 2019-02-18: qty 2

## 2019-02-18 MED ORDER — PROPOFOL 10 MG/ML IV BOLUS
INTRAVENOUS | Status: AC
  Filled 2019-02-18: qty 40

## 2019-02-18 MED ORDER — LACTATED RINGERS IV SOLN
INTRAVENOUS | Status: DC
  Administered 2019-02-18: 10:00:00 via INTRAVENOUS

## 2019-02-18 MED ORDER — ONDANSETRON HCL 4 MG/2ML IJ SOLN
INTRAMUSCULAR | Status: AC
  Filled 2019-02-18: qty 2

## 2019-02-18 MED ORDER — BACITRACIN-NEOMYCIN-POLYMYXIN 400-5-5000 EX OINT
TOPICAL_OINTMENT | CUTANEOUS | Status: AC
  Filled 2019-02-18: qty 1

## 2019-02-18 MED ORDER — OXYMETAZOLINE HCL 0.05 % NA SOLN
NASAL | Status: AC
  Filled 2019-02-18: qty 30

## 2019-02-18 MED ORDER — LIDOCAINE-EPINEPHRINE 1 %-1:100000 IJ SOLN
INTRAMUSCULAR | Status: AC
  Filled 2019-02-18: qty 1

## 2019-02-18 MED ORDER — FENTANYL CITRATE (PF) 100 MCG/2ML IJ SOLN: 25.0000 ug | INTRAMUSCULAR | Status: DC | PRN

## 2019-02-18 MED ORDER — DEXAMETHASONE SODIUM PHOSPHATE 10 MG/ML IJ SOLN
INTRAMUSCULAR | Status: AC
  Filled 2019-02-18: qty 1

## 2019-02-18 MED ORDER — FENTANYL CITRATE (PF) 250 MCG/5ML IJ SOLN
INTRAMUSCULAR | Status: AC
  Filled 2019-02-18: qty 5

## 2019-02-18 MED ORDER — PROMETHAZINE HCL 25 MG/ML IJ SOLN: 6.2500 mg | INTRAMUSCULAR | Status: DC | PRN

## 2019-02-18 MED ORDER — MEPERIDINE HCL 25 MG/ML IJ SOLN: 6.2500 mg | INTRAMUSCULAR | Status: DC | PRN

## 2019-02-18 SURGICAL SUPPLY — 28 items
BLADE SURG 15 STRL LF DISP TIS (BLADE) IMPLANT
BLADE SURG 15 STRL SS (BLADE)
CANISTER SUCT 1200ML W/VALVE (MISCELLANEOUS) ×3 IMPLANT
COVER BACK TABLE 60X90IN (DRAPES) ×3 IMPLANT
COVER MAYO STAND STRL (DRAPES) IMPLANT
COVER WAND RF STERILE (DRAPES) ×1 IMPLANT
DRAPE HALF SHEET 40X57 (DRAPES) ×3 IMPLANT
GAUZE SPONGE 2X2 8PLY STRL LF (GAUZE/BANDAGES/DRESSINGS) ×1 IMPLANT
GAUZE SPONGE 4X4 12PLY STRL (GAUZE/BANDAGES/DRESSINGS) ×2 IMPLANT
GLOVE BIO SURGEON STRL SZ 6 (GLOVE) ×3 IMPLANT
GOWN STRL NON-REIN LRG LVL3 (GOWN DISPOSABLE) ×3 IMPLANT
KIT BASIN OR (CUSTOM PROCEDURE TRAY) ×3 IMPLANT
KIT SPLINT NASAL DENVER LRG BE (GAUZE/BANDAGES/DRESSINGS) IMPLANT
KIT SPLINT NASAL DENVER MIN BE (GAUZE/BANDAGES/DRESSINGS) IMPLANT
KIT SPLINT NASAL DENVER PET BE (GAUZE/BANDAGES/DRESSINGS) IMPLANT
KIT SPLINT NASAL DENVER SM BEI (GAUZE/BANDAGES/DRESSINGS) IMPLANT
NDL PRECISIONGLIDE 27X1.5 (NEEDLE) IMPLANT
NEEDLE PRECISIONGLIDE 27X1.5 (NEEDLE) IMPLANT
PATTIES SURGICAL .5 X3 (DISPOSABLE) ×1 IMPLANT
SPLINT NASAL DOYLE BI-VL (GAUZE/BANDAGES/DRESSINGS) IMPLANT
SPLINT NASAL THERMO PLAST (MISCELLANEOUS) IMPLANT
SPONGE GAUZE 2X2 STER 10/PKG (GAUZE/BANDAGES/DRESSINGS)
SUT CHROMIC 4 0 P 3 18 (SUTURE) IMPLANT
SUT PROLENE 2 0 CT2 30 (SUTURE) ×1 IMPLANT
SYR CONTROL 10ML LL (SYRINGE) ×2 IMPLANT
TOWEL GREEN STERILE FF (TOWEL DISPOSABLE) ×3 IMPLANT
TUBE CONNECTING 20'X1/4 (TUBING) ×1
TUBE CONNECTING 20X1/4 (TUBING) ×2 IMPLANT

## 2019-02-18 NOTE — Anesthesia Postprocedure Evaluation (Signed)
Anesthesia Post Note  Patient: Travis Hanna  Procedure(s) Performed: CLOSED REDUCTION NASAL FRACTURE (N/A Nose)     Patient location during evaluation: PACU Anesthesia Type: General Level of consciousness: awake and alert Pain management: pain level controlled Vital Signs Assessment: post-procedure vital signs reviewed and stable Respiratory status: spontaneous breathing, nonlabored ventilation, respiratory function stable and patient connected to nasal cannula oxygen Cardiovascular status: blood pressure returned to baseline and stable Postop Assessment: no apparent nausea or vomiting Anesthetic complications: no    Last Vitals:  Vitals:   02/18/19 1240 02/18/19 1255  BP: 101/65 (!) 118/97  Pulse: 68 66  Resp: (!) 9 16  Temp:  (!) 36.3 C  SpO2: 99% 100%    Last Pain:  Vitals:   02/18/19 0954  PainSc: 0-No pain                 Effie Berkshire

## 2019-02-18 NOTE — Op Note (Signed)
Operative Note   DATE OF OPERATION: 10.13.20  LOCATION: [place] [facility] OR-outpatient  SURGICAL DIVISION: Plastic Surgery  PREOPERATIVE DIAGNOSES:  1. Closed nasal bone fracture 2. Closed nasal septum fracture  POSTOPERATIVE DIAGNOSES:  same  PROCEDURE:  1. Close reduction nasal bones, nasal septum  SURGEON: Irene Limbo MD MBA  ASSISTANT: none  ANESTHESIA:  General.   EBL: minimal  COMPLICATIONS: None immediate.   INDICATIONS FOR PROCEDURE:  The patient, Travis Hanna, is a 63 y.o. male born on 10-13-1955, is here for closed reduction nasal septal fracture. Patient has a history of Downs, multiple falls, seizure disorder.    FINDINGS: Presents with complete obstruction right nostril from septal deviation.  DESCRIPTION OF PROCEDURE:   The patient was taken to the operating room. A time out was performed and all information was confirmed to be correct. After achieving adequate sedation, local anesthetic infiltrated to perform bilateral infraorbital nerve blocks. Nasal speculum then used to reduce nasal septum to midline and to elevate depressed nasal bones. Nasopharynx suctioned.   The patient was allowed to wake from anesthesia and taken to the recovery room in satisfactory condition.   SPECIMENS: none  DRAINS: none  Irene Limbo, MD [facility] Plastic & Reconstructive Surgery [phone], pin [phone]

## 2019-02-18 NOTE — Interval H&P Note (Signed)
History and Physical Interval Note:  02/18/2019 9:15 AM  Travis Hanna  has presented today for surgery, with the diagnosis of closed nasal bone fracture closed septum fracture.  The various methods of treatment have been discussed with the patient and family. After consideration of risks, benefits and other options for treatment, the patient has consented to  Procedure(s): CLOSED REDUCTION NASAL FRACTURE (N/A) as a surgical intervention.  The patient's history has been reviewed, patient examined, no change in status, stable for surgery.  I have reviewed the patient's chart and labs.  Questions were answered to the patient's satisfaction.     Arnoldo Hooker [name]

## 2019-02-18 NOTE — Transfer of Care (Signed)
Immediate Anesthesia Transfer of Care Note  Patient: Travis Hanna  Procedure(s) Performed: CLOSED REDUCTION NASAL FRACTURE (N/A Nose)  Patient Location: PACU  Anesthesia Type:General  Level of Consciousness: drowsy  Airway & Oxygen Therapy: Patient Spontanous Breathing and Patient connected to face mask oxygen  Post-op Assessment: Report given to RN and Post -op Vital signs reviewed and stable  Post vital signs: Reviewed and stable  Last Vitals:  Vitals Value Taken Time  BP 120/65 02/18/19 1225  Temp    Pulse 66 02/18/19 1227  Resp 11 02/18/19 1227  SpO2 100 % 02/18/19 1227  Vitals shown include unvalidated device data.  Last Pain:  Vitals:   02/18/19 0954  PainSc: 0-No pain         Complications: No apparent anesthesia complications

## 2019-02-18 NOTE — Anesthesia Procedure Notes (Signed)
Procedure Name: General with mask airway Date/Time: 02/18/2019 12:04 PM Performed by: Colin Benton, CRNA Pre-anesthesia Checklist: Patient identified, Emergency Drugs available, Suction available and Patient being monitored Patient Re-evaluated:Patient Re-evaluated prior to induction Oxygen Delivery Method: Circle system utilized Preoxygenation: Pre-oxygenation with 100% oxygen Induction Type: IV induction Ventilation: Mask ventilation without difficulty and Oral airway inserted - appropriate to patient size Placement Confirmation: positive ETCO2 Dental Injury: Teeth and Oropharynx as per pre-operative assessment

## 2019-02-18 NOTE — Progress Notes (Signed)
No labs needed per Dr. Lissa Hoard

## 2019-02-19 ENCOUNTER — Encounter (HOSPITAL_COMMUNITY): Payer: Self-pay | Admitting: Plastic Surgery

## 2019-02-19 NOTE — Addendum Note (Signed)
Addendum  created 02/19/19 0744 by Colin Benton, CRNA   Intraprocedure Event edited, Intraprocedure Staff edited

## 2019-06-11 ENCOUNTER — Encounter (HOSPITAL_COMMUNITY): Payer: Self-pay

## 2019-06-11 ENCOUNTER — Emergency Department (HOSPITAL_COMMUNITY): Payer: MEDICARE

## 2019-06-11 ENCOUNTER — Inpatient Hospital Stay (HOSPITAL_COMMUNITY)
Admission: EM | Admit: 2019-06-11 | Discharge: 2019-06-16 | DRG: 641 | Disposition: A | Discharge status: Home Health | Payer: MEDICARE | Attending: Internal Medicine | Admitting: Internal Medicine

## 2019-06-11 DIAGNOSIS — R636 Underweight: Secondary | ICD-10-CM | POA: Diagnosis present

## 2019-06-11 DIAGNOSIS — I10 Essential (primary) hypertension: Secondary | ICD-10-CM

## 2019-06-11 DIAGNOSIS — Z79899 Other long term (current) drug therapy: Secondary | ICD-10-CM

## 2019-06-11 DIAGNOSIS — E861 Hypovolemia: Secondary | ICD-10-CM | POA: Diagnosis present

## 2019-06-11 DIAGNOSIS — Z20822 Contact with and (suspected) exposure to covid-19: Secondary | ICD-10-CM | POA: Diagnosis present

## 2019-06-11 DIAGNOSIS — E039 Hypothyroidism, unspecified: Secondary | ICD-10-CM

## 2019-06-11 DIAGNOSIS — G40909 Epilepsy, unspecified, not intractable, without status epilepticus: Secondary | ICD-10-CM | POA: Diagnosis present

## 2019-06-11 DIAGNOSIS — Q909 Down syndrome, unspecified: Secondary | ICD-10-CM

## 2019-06-11 DIAGNOSIS — Z681 Body mass index (BMI) 19 or less, adult: Secondary | ICD-10-CM

## 2019-06-11 DIAGNOSIS — K219 Gastro-esophageal reflux disease without esophagitis: Secondary | ICD-10-CM | POA: Diagnosis present

## 2019-06-11 DIAGNOSIS — E878 Other disorders of electrolyte and fluid balance, not elsewhere classified: Secondary | ICD-10-CM | POA: Diagnosis present

## 2019-06-11 DIAGNOSIS — Z7982 Long term (current) use of aspirin: Secondary | ICD-10-CM

## 2019-06-11 DIAGNOSIS — E870 Hyperosmolality and hypernatremia: Secondary | ICD-10-CM

## 2019-06-11 DIAGNOSIS — K449 Diaphragmatic hernia without obstruction or gangrene: Secondary | ICD-10-CM | POA: Diagnosis present

## 2019-06-11 DIAGNOSIS — K7460 Unspecified cirrhosis of liver: Secondary | ICD-10-CM | POA: Diagnosis present

## 2019-06-11 DIAGNOSIS — E876 Hypokalemia: Secondary | ICD-10-CM | POA: Diagnosis present

## 2019-06-11 DIAGNOSIS — Z79890 Hormone replacement therapy (postmenopausal): Secondary | ICD-10-CM

## 2019-06-11 DIAGNOSIS — R630 Anorexia: Secondary | ICD-10-CM

## 2019-06-11 DIAGNOSIS — M810 Age-related osteoporosis without current pathological fracture: Secondary | ICD-10-CM | POA: Diagnosis present

## 2019-06-11 DIAGNOSIS — K5900 Constipation, unspecified: Secondary | ICD-10-CM | POA: Diagnosis present

## 2019-06-11 DIAGNOSIS — F0390 Unspecified dementia without behavioral disturbance: Secondary | ICD-10-CM | POA: Diagnosis present

## 2019-06-11 DIAGNOSIS — Z9181 History of falling: Secondary | ICD-10-CM

## 2019-06-11 LAB — URINALYSIS, ROUTINE W REFLEX MICROSCOPIC
Bilirubin Urine: NEGATIVE
Glucose, UA: NEGATIVE mg/dL
Ketones, ur: 20 mg/dL — AB
Leukocytes,Ua: NEGATIVE
Nitrite: NEGATIVE
Protein, ur: 30 mg/dL — AB
Specific Gravity, Urine: >1.046 — ABNORMAL HIGH (ref 1.005–1.030)
pH: 5 (ref 5.0–8.0)

## 2019-06-11 LAB — COMPREHENSIVE METABOLIC PANEL
ALT: 16 U/L (ref 0–44)
AST: 24 U/L (ref 15–41)
Albumin: 3.4 g/dL — ABNORMAL LOW (ref 3.5–5.0)
Alkaline Phosphatase: 92 U/L (ref 38–126)
Anion gap: 8 (ref 5–15)
BUN: 20 mg/dL (ref 8–23)
CO2: 32 mmol/L (ref 22–32)
Calcium: 8.7 mg/dL — ABNORMAL LOW (ref 8.9–10.3)
Chloride: 115 mmol/L — ABNORMAL HIGH (ref 98–111)
Creatinine, Ser: 1 mg/dL (ref 0.61–1.24)
GFR calc Af Amer: >60 mL/min (ref >60)
GFR calc non Af Amer: >60 mL/min (ref >60)
Glucose, Bld: 95 mg/dL (ref 70–99)
Potassium: 4 mmol/L (ref 3.5–5.1)
Sodium: 155 mmol/L — ABNORMAL HIGH (ref 135–145)
Total Bilirubin: 0.6 mg/dL (ref 0.3–1.2)
Total Protein: 7.3 g/dL (ref 6.5–8.1)

## 2019-06-11 LAB — CBC WITH DIFFERENTIAL/PLATELET
Abs Immature Granulocytes: 0.04 10*3/uL (ref 0.00–0.07)
Basophils Absolute: 0.1 10*3/uL (ref 0.0–0.1)
Basophils Relative: 1 %
Eosinophils Absolute: 0 10*3/uL (ref 0.0–0.5)
Eosinophils Relative: 0 %
HCT: 42.5 % (ref 39.0–52.0)
Hemoglobin: 13.4 g/dL (ref 13.0–17.0)
Immature Granulocytes: 1 %
Lymphocytes Relative: 17 %
Lymphs Abs: 1 10*3/uL (ref 0.7–4.0)
MCH: 37.4 pg — ABNORMAL HIGH (ref 26.0–34.0)
MCHC: 31.5 g/dL (ref 30.0–36.0)
MCV: 118.7 fL — ABNORMAL HIGH (ref 80.0–100.0)
Monocytes Absolute: 0.5 10*3/uL (ref 0.1–1.0)
Monocytes Relative: 8 %
Neutro Abs: 4.1 10*3/uL (ref 1.7–7.7)
Neutrophils Relative %: 73 %
Platelets: 137 10*3/uL — ABNORMAL LOW (ref 150–400)
RBC: 3.58 MIL/uL — ABNORMAL LOW (ref 4.22–5.81)
RDW: 13.7 % (ref 11.5–15.5)
WBC: 5.7 10*3/uL (ref 4.0–10.5)
nRBC: 0 % (ref 0.0–0.2)

## 2019-06-11 LAB — TSH: TSH: 1.379 u[IU]/mL (ref 0.350–4.500)

## 2019-06-11 LAB — VITAMIN B12: Vitamin B-12: 287 pg/mL (ref 180–914)

## 2019-06-11 MED ORDER — LORAZEPAM 2 MG/ML IJ SOLN
2.0000 mg | Freq: Once | INTRAMUSCULAR | Status: AC
  Administered 2019-06-11: 2 mg via INTRAVENOUS

## 2019-06-11 MED ORDER — SODIUM CHLORIDE 0.9 % IV BOLUS
1000.0000 mL | Freq: Once | INTRAVENOUS | Status: AC
  Administered 2019-06-11: 1000 mL via INTRAVENOUS

## 2019-06-11 MED ORDER — ACETAMINOPHEN 650 MG RE SUPP: 650.0000 mg | Freq: Four times a day (QID) | RECTAL | Status: DC | PRN

## 2019-06-11 MED ORDER — ENOXAPARIN SODIUM 40 MG/0.4ML ~~LOC~~ SOLN
40.0000 mg | Freq: Every day | SUBCUTANEOUS | Status: DC
  Administered 2019-06-11 – 2019-06-15 (×5): 40 mg via SUBCUTANEOUS
  Filled 2019-06-11 (×5): qty 0.4

## 2019-06-11 MED ORDER — ACETAMINOPHEN 325 MG PO TABS: 650.0000 mg | ORAL_TABLET | Freq: Four times a day (QID) | ORAL | Status: DC | PRN

## 2019-06-11 MED ORDER — LORAZEPAM 2 MG/ML IJ SOLN
2.0000 mg | INTRAMUSCULAR | Status: DC | PRN
  Filled 2019-06-11: qty 1

## 2019-06-11 MED ORDER — SODIUM CHLORIDE 0.45 % IV SOLN: INTRAVENOUS | Status: DC

## 2019-06-11 MED ORDER — IOHEXOL 300 MG/ML  SOLN
75.0000 mL | Freq: Once | INTRAMUSCULAR | Status: AC | PRN
  Administered 2019-06-11: 19:00:00 75 mL via INTRAVENOUS

## 2019-06-11 NOTE — ED Provider Notes (Signed)
[facility]-EMERGENCY DEPT Provider Note   CSN: [id number] Arrival date & time: 06/11/19  1426   History Chief Complaint  Patient presents with  . Not eating   Travis Hanna is a 64 y.o. male with history of down syndrome, seizure d/o, dementia who presents with anorexia. Pt reportedly has not been eating or drinking for the past 3 days. Staff at his group home state that he has also been more agitated than usual. He is not violent but just hard to redirect, restless, and not cooperative. He went to the eye doctor this morning and had a fall there and his nose was bleeding. Staff states he does have a history of frequent falls but they cannot tell if it's because he's dizzy or not. He hasn't had a seizure since Oct or Nov. He went to his PCP this after noon and they felt pt needed to be evaluated for dehydration in the ED so he was transported here. They deny that he's had a fever. No cough or vomiting. He has been taking his meds. He has been going to the bathroom normally as far as they can tell.  LEVEL 5 CAVEAT due to pt being non-verbal   HPI   Past Medical History:  Diagnosis Date  . Anemia   . Arthritis   . Down syndrome   . Frequent falls   . Hypertension    now with hypotension  . Hypothyroidism   . MR (mental retardation)   . Myopia with astigmatism   . Osteoporosis   . Seizure disorder [facility])     Patient Active Problem List   Diagnosis Date Noted  . Nonspecific abnormal electrocardiogram (ECG) (EKG) 01/23/2019  . Frequent falls 01/23/2019  . Essential hypertension 01/23/2019  . Bradycardia 01/23/2019  . CVA (cerebral vascular accident) (HCC) 08/25/2016  . Right leg weakness 08/23/2016  . Hypothyroidism 06/26/2013  . Ileus (HCC) 06/25/2013  . Abdominal pain, unspecified site 06/25/2013  . Down syndrome 06/25/2013  . Constipation 06/25/2013    Past Surgical History:  Procedure Laterality Date  . CLOSED REDUCTION NASAL FRACTURE N/A  02/18/2019   Procedure: CLOSED REDUCTION NASAL FRACTURE;  Surgeon: Glenna Fellows, MD;  Location: [facility] OR;  Service: Plastics;  Laterality: N/A;       No family history on file.  Social History   Tobacco Use  . Smoking status: Never Smoker  . Smokeless tobacco: Never Used  Substance Use Topics  . Alcohol use: No  . Drug use: No    Home Medications Prior to Admission medications   Medication Sig Start Date End Date Taking? Authorizing Provider  alendronate (FOSAMAX) 70 MG tablet Take 70 mg by mouth every Friday. Take with a full glass of water on an empty stomach.    [provider]  aspirin EC 81 MG EC tablet Take 1 tablet (81 mg total) by mouth daily. 08/26/16   Leatha Gilding, MD  Calcium Carbonate-Vitamin D 600-400 MG-UNIT tablet Take 1 tablet by mouth 2 (two) times daily.    [provider]  carbamazepine (TEGRETOL) 100 MG chewable tablet Chew 200 tablets by mouth 2 (two) times daily.  01/07/19   [provider]  cetaphil (CETAPHIL) lotion Apply 1 application topically daily. Apply topically to face    [provider]  Cholecalciferol (VITAMIN D) 50 MCG (2000 UT) tablet Take 2,000 Units by mouth daily.    [provider]  esomeprazole (NEXIUM) 40 MG capsule Take 40 mg by mouth at  bedtime.    [provider]  Glycerin-Hypromellose-PEG 400 (VISINE TEARS) 0.2-0.36-1 % SOLN Place 1 drop into both eyes 3 (three) times daily.    [provider]  ketoconazole (NIZORAL) 2 % shampoo Apply 1 application topically every Monday, Wednesday, and Friday.     [provider]  lactulose (CHRONULAC) 10 GM/15ML solution Take 13.3 g by mouth daily.     [provider]  levothyroxine (SYNTHROID) 50 MCG tablet Take 50 mcg by mouth daily before breakfast.     [provider]  lubiprostone (AMITIZA) 24 MCG capsule Take 24 mcg by mouth daily with breakfast.     [provider]  metroNIDAZOLE (METROGEL) 1 %  gel Apply 1 application topically every evening. Apply to face    [provider]  mometasone (ELOCON) 0.1 % cream Apply 1 application topically See admin instructions. Apply sparingly to nasal bridge every other day    [provider]  oxymetazoline (AFRIN) 0.05 % nasal spray Place 1 spray into both nostrils 2 (two) times daily. For 7 days    [provider]  Soap & Cleansers (PHISODERM NORMAL TO OILY EX) Apply 1 application topically daily. To face    [provider]    Allergies    Carbapenems, Cephalosporins, Other, Penicillamine, and Penicillins  Review of Systems   Review of Systems  Unable to perform ROS: Patient nonverbal    Physical Exam Updated Vital Signs BP 99/80   Pulse 84   Temp 98.8 F (37.1 C) (Oral)   Resp 13   SpO2 94%   Physical Exam Vitals and nursing note reviewed.  Constitutional:      General: He is not in acute distress.    Appearance: Normal appearance. He is well-developed. He is not ill-appearing.     Comments: Restless chronically ill appearing male. Dry mucous membranes. Non-verbal  HENT:     Head: Normocephalic and atraumatic.     Mouth/Throat:     Mouth: Mucous membranes are dry.  Eyes:     General: No scleral icterus.       Right eye: No discharge.        Left eye: No discharge.     Pupils: Pupils are equal, round, and reactive to light.  Cardiovascular:     Rate and Rhythm: Normal rate and regular rhythm.  Pulmonary:     Effort: Pulmonary effort is normal. No respiratory distress.     Breath sounds: Normal breath sounds.  Abdominal:     General: There is no distension.     Palpations: Abdomen is soft.     Tenderness: There is no abdominal tenderness.  Musculoskeletal:     Cervical back: Normal range of motion.  Skin:    General: Skin is warm and dry.  Neurological:     Mental Status: He is alert.     Comments: Unable to assess  Psychiatric:        Mood and Affect: Mood is anxious.         Speech: He is noncommunicative.        Behavior: Behavior is uncooperative.     ED Results / Procedures / Treatments   Labs (all labs ordered are listed, but only abnormal results are displayed) Labs Reviewed  COMPREHENSIVE METABOLIC PANEL - Abnormal; Notable for the following components:      Result Value   Sodium 155 (*)    Chloride 115 (*)    Calcium 8.7 (*)    Albumin 3.4 (*)  All other components within normal limits  CBC WITH DIFFERENTIAL/PLATELET - Abnormal; Notable for the following components:   RBC 3.58 (*)    MCV 118.7 (*)    MCH 37.4 (*)    Platelets 137 (*)    All other components within normal limits  URINALYSIS, ROUTINE W REFLEX MICROSCOPIC - Abnormal; Notable for the following components:   Color, Urine AMBER (*)    Specific Gravity, Urine >1.046 (*)    Hgb urine dipstick MODERATE (*)    Ketones, ur 20 (*)    Protein, ur 30 (*)    Bacteria, UA FEW (*)    All other components within normal limits  SARS CORONAVIRUS 2 (TAT 6-24 HRS)  TSH  HIV ANTIBODY (ROUTINE TESTING W REFLEX)  CBC  COMPREHENSIVE METABOLIC PANEL  VITAMIN B12    EKG EKG Interpretation  Date/Time:  Wednesday June 11 2019 16:29:59 EST Ventricular Rate:  81 PR Interval:    QRS Duration: 104 QT Interval:  440 QTC Calculation: 511 R Axis:   83 Text Interpretation: Sinus rhythm Borderline right axis deviation Prolonged QT interval Confirmed by Geoffery Lyons ([id number]) on 06/11/2019 4:32:55 PM   Radiology CT Head Wo Contrast  Result Date: 06/11/2019 CLINICAL DATA:  Fall EXAM: CT HEAD WITHOUT CONTRAST TECHNIQUE: Contiguous axial images were obtained from the base of the skull through the vertex without intravenous contrast. COMPARISON:  12/21/2018 FINDINGS: Brain: There is no acute intracranial hemorrhage, mass-effect, or edema. Gray-white differentiation is preserved. There is no extra-axial fluid collection. Prominence of the ventricles and sulci reflects parenchymal volume loss, which  appears increased. Patchy hypoattenuation in the supratentorial white matter is nonspecific but likely reflects stable chronic microvascular ischemic changes. A subcentimeter colloid cyst is again identified at the foramen of [place]. Vascular: There is mild atherosclerotic calcification at the skull base. Skull: Calvarium is unremarkable. Sinuses/Orbits: Mild retained secretions. No acute orbital abnormality. Other: None. IMPRESSION: No evidence of acute intracranial injury. Increased parenchymal volume loss since 12/21/2018. Stable chronic microvascular ischemic changes. Stable subcentimeter colloid cyst. Electronically Signed   By: Guadlupe Spanish M.D.   On: 06/11/2019 18:33   CT Abdomen Pelvis W Contrast  Result Date: 06/11/2019 CLINICAL DATA:  Abdominal pain. Decreased appetite. EXAM: CT ABDOMEN AND PELVIS WITH CONTRAST TECHNIQUE: Multidetector CT imaging of the abdomen and pelvis was performed using the standard protocol following bolus administration of intravenous contrast. CONTRAST:  4mL OMNIPAQUE IOHEXOL 300 MG/ML  SOLN COMPARISON:  June 25, 2013 FINDINGS: Lower chest: Mild atelectasis is seen within the bilateral lung bases. Hepatobiliary: The liver is cirrhotic in appearance. No focal liver abnormality is seen. No gallstones, gallbladder wall thickening, or biliary dilatation. Pancreas: Unremarkable. No pancreatic ductal dilatation or surrounding inflammatory changes. Spleen: Normal in size without focal abnormality. Adrenals/Urinary Tract: The right adrenal gland is normal in appearance. The left adrenal gland is poorly visualized. Kidneys are normal, without renal calculi, focal lesion, or hydronephrosis. Bladder is unremarkable. Stomach/Bowel: There is a large hiatal hernia. The appendix is not identified. No evidence of bowel dilatation. A large amount of stool is seen within the distal sigmoid colon and rectum. Vascular/Lymphatic: No significant vascular findings are present. No enlarged  abdominal or pelvic lymph nodes. Reproductive: Prostate is unremarkable. Other: No abdominal wall hernia or abnormality. No abdominopelvic ascites. Musculoskeletal: There is marked severity levoscoliosis of the upper lumbar spine with multilevel degenerative changes. IMPRESSION: 1. Large hiatal hernia. 2. Cirrhosis. 3. Marked severity levoscoliosis of the upper lumbar spine with multilevel degenerative changes. Electronically Signed  By: Virgina Norfolk M.D.   On: 06/11/2019 18:57   DG Chest Port 1 View  Result Date: 06/11/2019 CLINICAL DATA:  Anorexia, altered level of consciousness EXAM: PORTABLE CHEST 1 VIEW COMPARISON:  11/10/2018 FINDINGS: Single frontal view of the chest demonstrates an unremarkable cardiac silhouette. No airspace disease, effusion, or pneumothorax. Prominent skin fold overlies left lung. No acute bony abnormalities. IMPRESSION: 1. No acute intrathoracic process. Electronically Signed   By: Randa Ngo M.D.   On: 06/11/2019 16:26   CT Maxillofacial WO CM  Result Date: 06/11/2019 CLINICAL DATA:  Fall EXAM: CT MAXILLOFACIAL WITHOUT CONTRAST TECHNIQUE: Multidetector CT imaging of the maxillofacial structures was performed. Multiplanar CT image reconstructions were also generated. COMPARISON:  02/05/2019 FINDINGS: Osseous: There is no definite acute facial fracture. Chronic fracture deformity of the nasal bones and nasal septum. Orbits: No intraorbital hematoma.  Bilateral medial gaze deviation. Sinuses: Minor mucosal thickening with retained secretions in the sphenoid sinuses. Soft tissues: Negative. Limited intracranial: Dictated separately. IMPRESSION: No definite acute facial fracture. There is likely chronic deformity of the nasal bones and nasal septum. Electronically Signed   By: Macy Mis M.D.   On: 06/11/2019 18:38    Procedures Procedures (including critical care time)  Medications Ordered in ED Medications  enoxaparin (LOVENOX) injection 40 mg (has no  administration in time range)  0.45 % sodium chloride infusion ( Intravenous New Bag/Given 06/11/19 2110)  acetaminophen (TYLENOL) tablet 650 mg (has no administration in time range)    Or  acetaminophen (TYLENOL) suppository 650 mg (has no administration in time range)  sodium chloride 0.9 % bolus 1,000 mL (0 mLs Intravenous Stopped 06/11/19 2042)  LORazepam (ATIVAN) injection 2 mg (2 mg Intravenous Given 06/11/19 1802)  iohexol (OMNIPAQUE) 300 MG/ML solution 75 mL (75 mLs Intravenous Contrast Given 06/11/19 1836)  sodium chloride 0.9 % bolus 1,000 mL (0 mLs Intravenous Stopped 06/11/19 2148)    ED Course  I have reviewed the triage vital signs and the nursing notes.  Pertinent labs & imaging results that were available during my care of the patient were reviewed by me and considered in my medical decision making (see chart for details).  64 year old who presents with anorexia for the past couple of days. Unclear etiology. BP is soft here but otherwise vitals are reassuring. Mucous membranes are dry. Pt is restless and uncooperative. Soft restrains ordered so testing could be performed. Will order labs, UA, CXR, CT head a max face due to reported trauma. Will order IV fluids.  CC is remarkable for high MCV which is a chronic problem and thrombocytopenia which is new. CMP is remarkable for hypernatremia (155) and hyperchloremia (115). UA has 20 ketones and >1.046 high specific gravity. Also has RBC but was in and out cath. CT head and max face are negative. CXR is negative. EKG is SR with prolonged QT. Shared visit with Dr. Stark Jock. On review of EMR pt has had ileus in the past - will add on CT abdomen/pelvis.  CT shows large hiatal hernia, cirrhosis, degenerative changes of the spine but is otherwise negative. Pt is sedated from meds here and we are unable to PO challenge. On discussion with staff it sounds like he will eat a little bit and doesn't have any dysphagia symptoms - he is just refusing to  [place]. Will admit for further management  Discussed with Dr. Maudie Mercury who will admit for dehydration, poor oral intake, hypernatremia.    MDM Rules/Calculators/A&P  Final Clinical Impression(s) / ED Diagnoses Final diagnoses:  Hypernatremia  Anorexia    Rx / DC Orders ED Discharge Orders    None       Bethel BornGekas, Latrisa Hellums Marie, PA-C 06/11/19 2237    Geoffery Lyonselo, [place], MD 06/11/19 [phone]

## 2019-06-11 NOTE — H&P (Signed)
TRH H&P    Patient Demographics:    Travis Hanna, is a 64 y.o. male  MRN: [id number]  DOB - 05/12/1955  Admit Date - 06/11/2019  Referring MD/NP/PA:  Montez Hageman  Outpatient Primary MD for the patient is [facility] [place]  Patient coming from:  Group home  Chief complaint- fall , bloody nose   HPI:    Travis Hanna  is a 64 y.o. male,  w Downs syndrome, MR, Seizure disorder,  hypertension, hypothyroidism, osteoporosis, h/o anemia, presents with fall and bloody nose while at doctors office and sent to ER for evaluation.  Per ED, fall w/up negative , but patient has had poor oral intake and has hypernatremia.   In ED,  T 98.8, P 84, R 13, Bp 99/80  Pox 94% on RA  CT brain  IMPRESSION: No evidence of acute intracranial injury.   Increased parenchymal volume loss since 12/21/2018. Stable chronic microvascular ischemic changes.   Stable subcentimeter colloid cyst.   CT maxillofacial  IMPRESSION: No definite acute facial fracture. There is likely chronic deformity of the nasal bones and nasal septum.   CT abd/ pelvis IMPRESSION: 1. Large hiatal hernia. 2. Cirrhosis. 3. Marked severity levoscoliosis of the upper lumbar spine with multilevel degenerative changes.   CXR IMPRESSION: 1. No acute intrathoracic process.   Na 155, K 4.0, Bun 20, Creatinine 1.0 Alb 3.4 Ast 24, Alt 16 Wbc 5.7, hgb 13.4, Plt 137  Urinalysis pending  Pt will be admitted observation for hypernatremia.     Review of systems:    In addition to the HPI above, pt unable to provide due to MR No Fever-chills, No Headache, No changes with Vision or hearing, No problems swallowing food or Liquids, No Chest pain, Cough or Shortness of Breath, No Abdominal pain, No Nausea or Vomiting, bowel movements are regular, No Blood in stool or Urine, No dysuria, No new skin rashes or bruises, No new joints pains-aches,  No  new weakness, tingling, numbness in any extremity, No recent weight gain or loss, No polyuria, polydypsia or polyphagia, No significant Mental Stressors.  All other systems reviewed and are negative.    Past History of the following :    Past Medical History:  Diagnosis Date   Anemia    Arthritis    Down syndrome    Frequent falls    Hypertension    now with hypotension   Hypothyroidism    MR (mental retardation)    Myopia with astigmatism    Osteoporosis    Seizure disorder [facility])       Past Surgical History:  Procedure Laterality Date   CLOSED REDUCTION NASAL FRACTURE N/A 02/18/2019   Procedure: CLOSED REDUCTION NASAL FRACTURE;  Surgeon: Irene Limbo, MD;  Location: [facility];  Service: Plastics;  Laterality: N/A;      Social History:      Social History   Tobacco Use   Smoking status: Never Smoker   Smokeless tobacco: Never Used  Substance Use Topics   Alcohol use: No  Family History :    History reviewed. No pertinent family history. Pt is unable to provide   Home Medications:   Prior to Admission medications   Medication Sig Start Date End Date Taking? Authorizing Provider  alendronate (FOSAMAX) 70 MG tablet Take 70 mg by mouth every Friday. Take with a full glass of water on an empty stomach.   Yes [provider]  aspirin EC 81 MG EC tablet Take 1 tablet (81 mg total) by mouth daily. 08/26/16  Yes Leatha Gilding, MD  Calcium Carbonate-Vitamin D 600-400 MG-UNIT tablet Take 1 tablet by mouth 2 (two) times daily.   Yes [provider]  carbamazepine (TEGRETOL) 100 MG chewable tablet Chew 200 tablets by mouth 2 (two) times daily.  01/07/19  Yes [provider]  cetaphil (CETAPHIL) lotion Apply 1 application topically daily. Apply topically to face   Yes [provider]  Cholecalciferol (VITAMIN D) 50 MCG (2000 UT) tablet Take 2,000 Units by mouth daily.   Yes [provider]  esomeprazole (NEXIUM) 40 MG  capsule Take 40 mg by mouth at bedtime.   Yes [provider]  Glycerin-Hypromellose-PEG 400 (VISINE TEARS) 0.2-0.36-1 % SOLN Place 1 drop into both eyes 3 (three) times daily.   Yes [provider]  ketoconazole (NIZORAL) 2 % shampoo Apply 1 application topically every Monday, Wednesday, and Friday.    Yes [provider]  lactulose (CHRONULAC) 10 GM/15ML solution Take 13.3 g by mouth daily.    Yes [provider]  levothyroxine (SYNTHROID) 50 MCG tablet Take 50 mcg by mouth daily before breakfast.    Yes [provider]  lubiprostone (AMITIZA) 24 MCG capsule Take 24 mcg by mouth daily with breakfast.    Yes [provider]  metroNIDAZOLE (METROGEL) 1 % gel Apply 1 application topically every evening. Apply to face   Yes [provider]  mometasone (ELOCON) 0.1 % cream Apply 1 application topically See admin instructions. Apply sparingly to nasal bridge every other day   Yes [provider]  Soap & Cleansers (PHISODERM NORMAL TO OILY EX) Apply 1 application topically daily. To face   Yes [provider]     Allergies:     Allergies  Allergen Reactions   Carbapenems Other (See Comments)    unknown   Cephalosporins Other (See Comments)    unknown   Other Other (See Comments)    Allergic to Betalactams per group home-unknown reaction    Penicillamine Other (See Comments)    unknown   Penicillins Other (See Comments)    unknown     Physical Exam:   Vitals  Blood pressure 106/67, pulse 67, temperature 98.8 F (37.1 C), temperature source Oral, resp. rate (!) 23, SpO2 100 %.  1.  General: Axoxo1(person)  2. Psychiatric: euthymic  3. Neurologic: Pt moves to painful stimuli  4. HEENMT:  Anicteric  5. Respiratory : CTAB  6. Cardiovascular : Rrr s1, s2, no m/g/r  7. Gastrointestinal:  Abd: soft, nt, nd, +bs  8. Skin:  Ext: no c/c/e, no rash  9.Musculoskeletal:  Good ROM    Data  Review:    CBC Recent Labs  Lab 06/11/19 1531  WBC 5.7  HGB 13.4  HCT 42.5  PLT 137*  MCV 118.7*  MCH 37.4*  MCHC 31.5  RDW 13.7  LYMPHSABS 1.0  MONOABS 0.5  EOSABS 0.0  BASOSABS 0.1   ------------------------------------------------------------------------------------------------------------------  Results for orders placed or performed during the hospital encounter of 06/11/19 (  from the past 48 hour(s))  Comprehensive metabolic panel     Status: Abnormal   Collection Time: 06/11/19  3:31 PM  Result Value Ref Range   Sodium 155 (H) 135 - 145 mmol/L   Potassium 4.0 3.5 - 5.1 mmol/L   Chloride 115 (H) 98 - 111 mmol/L   CO2 32 22 - 32 mmol/L   Glucose, Bld 95 70 - 99 mg/dL   BUN 20 8 - 23 mg/dL   Creatinine, Ser 0.25 0.61 - 1.24 mg/dL   Calcium 8.7 (L) 8.9 - 10.3 mg/dL   Total Protein 7.3 6.5 - 8.1 g/dL   Albumin 3.4 (L) 3.5 - 5.0 g/dL   AST 24 15 - 41 U/L   ALT 16 0 - 44 U/L   Alkaline Phosphatase 92 38 - 126 U/L   Total Bilirubin 0.6 0.3 - 1.2 mg/dL   GFR calc non Af Amer >60 >60 mL/min   GFR calc Af Amer >60 >60 mL/min   Anion gap 8 5 - 15    Comment: Performed at [facility], [address] [place], [place] [address]  CBC with Differential     Status: Abnormal   Collection Time: 06/11/19  3:31 PM  Result Value Ref Range   WBC 5.7 4.0 - 10.5 K/uL   RBC 3.58 (L) 4.22 - 5.81 MIL/uL   Hemoglobin 13.4 13.0 - 17.0 g/dL   HCT 23.7 62.8 - 31.5 %   MCV 118.7 (H) 80.0 - 100.0 fL   MCH 37.4 (H) 26.0 - 34.0 pg   MCHC 31.5 30.0 - 36.0 g/dL   RDW 17.6 16.0 - 73.7 %   Platelets 137 (L) 150 - 400 K/uL   nRBC 0.0 0.0 - 0.2 %   Neutrophils Relative % 73 %   Neutro Abs 4.1 1.7 - 7.7 K/uL   Lymphocytes Relative 17 %   Lymphs Abs 1.0 0.7 - 4.0 K/uL   Monocytes Relative 8 %   Monocytes Absolute 0.5 0.1 - 1.0 K/uL   Eosinophils Relative 0 %   Eosinophils Absolute 0.0 0.0 - 0.5 K/uL   Basophils Relative 1 %   Basophils Absolute 0.1 0.0 - 0.1 K/uL   RBC  Morphology MORPHOLOGY UNREMARKABLE    Immature Granulocytes 1 %   Abs Immature Granulocytes 0.04 0.00 - 0.07 K/uL    Comment: Performed at [facility], [address] [place], [place] [address]    Chemistries  Recent Labs  Lab 06/11/19 1531  NA 155*  K 4.0  CL 115*  CO2 32  GLUCOSE 95  BUN 20  CREATININE 1.00  CALCIUM 8.7*  AST 24  ALT 16  ALKPHOS 92  BILITOT 0.6   ------------------------------------------------------------------------------------------------------------------  ------------------------------------------------------------------------------------------------------------------ GFR: CrCl cannot be calculated (Unknown ideal weight.). Liver Function Tests: Recent Labs  Lab 06/11/19 1531  AST 24  ALT 16  ALKPHOS 92  BILITOT 0.6  PROT 7.3  ALBUMIN 3.4*   No results for input(s): LIPASE, AMYLASE in the last 168 hours. No results for input(s): AMMONIA in the last 168 hours. Coagulation Profile: No results for input(s): INR, PROTIME in the last 168 hours. Cardiac Enzymes: No results for input(s): CKTOTAL, CKMB, CKMBINDEX, TROPONINI in the last 168 hours. BNP (last 3 results) No results for input(s): PROBNP in the last 8760 hours. HbA1C: No results for input(s): HGBA1C in the last 72 hours. CBG: No results for input(s): GLUCAP in the last 168 hours. Lipid Profile: No results for input(s): CHOL, HDL, LDLCALC,  TRIG, CHOLHDL, LDLDIRECT in the last 72 hours. Thyroid Function Tests: No results for input(s): TSH, T4TOTAL, FREET4, T3FREE, THYROIDAB in the last 72 hours. Anemia Panel: No results for input(s): VITAMINB12, FOLATE, FERRITIN, TIBC, IRON, RETICCTPCT in the last 72 hours.  --------------------------------------------------------------------------------------------------------------- Urine analysis:    Component Value Date/Time   COLORURINE YELLOW 11/09/2018 2345   APPEARANCEUR CLEAR 11/09/2018 2345   LABSPEC 1.021  11/09/2018 2345   PHURINE 5.0 11/09/2018 2345   GLUCOSEU NEGATIVE 11/09/2018 2345   HGBUR NEGATIVE 11/09/2018 2345   BILIRUBINUR NEGATIVE 11/09/2018 2345   KETONESUR 5 (A) 11/09/2018 2345   PROTEINUR NEGATIVE 11/09/2018 2345   UROBILINOGEN 0.2 06/25/2013 2011   NITRITE NEGATIVE 11/09/2018 2345   LEUKOCYTESUR NEGATIVE 11/09/2018 2345      Imaging Results:    CT Head Wo Contrast  Result Date: 06/11/2019 CLINICAL DATA:  Fall EXAM: CT HEAD WITHOUT CONTRAST TECHNIQUE: Contiguous axial images were obtained from the base of the skull through the vertex without intravenous contrast. COMPARISON:  12/21/2018 FINDINGS: Brain: There is no acute intracranial hemorrhage, mass-effect, or edema. Gray-white differentiation is preserved. There is no extra-axial fluid collection. Prominence of the ventricles and sulci reflects parenchymal volume loss, which appears increased. Patchy hypoattenuation in the supratentorial white matter is nonspecific but likely reflects stable chronic microvascular ischemic changes. A subcentimeter colloid cyst is again identified at the foramen of Monro. Vascular: There is mild atherosclerotic calcification at the skull base. Skull: Calvarium is unremarkable. Sinuses/Orbits: Mild retained secretions. No acute orbital abnormality. Other: None. IMPRESSION: No evidence of acute intracranial injury. Increased parenchymal volume loss since 12/21/2018. Stable chronic microvascular ischemic changes. Stable subcentimeter colloid cyst. Electronically Signed   By: Guadlupe SpanishPraneil  Patel M.D.   On: 06/11/2019 18:33   CT Abdomen Pelvis W Contrast  Result Date: 06/11/2019 CLINICAL DATA:  Abdominal pain. Decreased appetite. EXAM: CT ABDOMEN AND PELVIS WITH CONTRAST TECHNIQUE: Multidetector CT imaging of the abdomen and pelvis was performed using the standard protocol following bolus administration of intravenous contrast. CONTRAST:  75mL OMNIPAQUE IOHEXOL 300 MG/ML  SOLN COMPARISON:  June 25, 2013  FINDINGS: Lower chest: Mild atelectasis is seen within the bilateral lung bases. Hepatobiliary: The liver is cirrhotic in appearance. No focal liver abnormality is seen. No gallstones, gallbladder wall thickening, or biliary dilatation. Pancreas: Unremarkable. No pancreatic ductal dilatation or surrounding inflammatory changes. Spleen: Normal in size without focal abnormality. Adrenals/Urinary Tract: The right adrenal gland is normal in appearance. The left adrenal gland is poorly visualized. Kidneys are normal, without renal calculi, focal lesion, or hydronephrosis. Bladder is unremarkable. Stomach/Bowel: There is a large hiatal hernia. The appendix is not identified. No evidence of bowel dilatation. A large amount of stool is seen within the distal sigmoid colon and rectum. Vascular/Lymphatic: No significant vascular findings are present. No enlarged abdominal or pelvic lymph nodes. Reproductive: Prostate is unremarkable. Other: No abdominal wall hernia or abnormality. No abdominopelvic ascites. Musculoskeletal: There is marked severity levoscoliosis of the upper lumbar spine with multilevel degenerative changes. IMPRESSION: 1. Large hiatal hernia. 2. Cirrhosis. 3. Marked severity levoscoliosis of the upper lumbar spine with multilevel degenerative changes. Electronically Signed   By: Aram Candelahaddeus  Houston M.D.   On: 06/11/2019 18:57   DG Chest Port 1 View  Result Date: 06/11/2019 CLINICAL DATA:  Anorexia, altered level of consciousness EXAM: PORTABLE CHEST 1 VIEW COMPARISON:  11/10/2018 FINDINGS: Single frontal view of the chest demonstrates an unremarkable cardiac silhouette. No airspace disease, effusion, or pneumothorax. Prominent skin fold overlies left lung. No acute bony  abnormalities. IMPRESSION: 1. No acute intrathoracic process. Electronically Signed   By: Sharlet Salina M.D.   On: 06/11/2019 16:26   CT Maxillofacial WO CM  Result Date: 06/11/2019 CLINICAL DATA:  Fall EXAM: CT MAXILLOFACIAL WITHOUT  CONTRAST TECHNIQUE: Multidetector CT imaging of the maxillofacial structures was performed. Multiplanar CT image reconstructions were also generated. COMPARISON:  02/05/2019 FINDINGS: Osseous: There is no definite acute facial fracture. Chronic fracture deformity of the nasal bones and nasal septum. Orbits: No intraorbital hematoma.  Bilateral medial gaze deviation. Sinuses: Minor mucosal thickening with retained secretions in the sphenoid sinuses. Soft tissues: Negative. Limited intracranial: Dictated separately. IMPRESSION: No definite acute facial fracture. There is likely chronic deformity of the nasal bones and nasal septum. Electronically Signed   By: Guadlupe Spanish M.D.   On: 06/11/2019 18:38   nsr at 80, nl axis, nl int, no st-t changes c/w ischemia   Assessment & Plan:    Principal Problem:   Hypernatremia Active Problems:   Down syndrome   Hypothyroidism   Essential hypertension  Hypernatremia Hydrate with 1/2 ns at per hour Check cmp in am  Seizure do Cont Carbamazepine  Macrocytosis Check b12 Check folic acid Check tsh  Hypothyroidism Cont Levothyroxine Check tsh  Osteoporosis Resume fosamax on discharge  Gerd Cont PPI  Constipation Cont Lactulose   DVT Prophylaxis-   Lovenox - SCDs   AM Labs Ordered, also please review Full Orders  Family Communication: Admission, patients condition and plan of care including tests being ordered have been discussed with the patient and who indicate understanding and agree with the plan and Code Status.  Code Status:  FULL CODE per patient  Admission status: Observation: Based on patients clinical presentation and evaluation of above clinical data, I have made determination that patient meets Observation criteria at this time.  Time spent in minutes :  55 minutes   Pearson Grippe M.D on 06/11/2019 at 8:53 PM

## 2019-06-11 NOTE — ED Notes (Signed)
Patient in CT at this time.

## 2019-06-11 NOTE — ED Notes (Signed)
PATIENT REFUSED EKG

## 2019-06-11 NOTE — ED Triage Notes (Addendum)
From group home via EMS  Staff saying patient has not eaten the past 3 days but has been drinking plenty of water Patient has intellectual disability  Per EMS - patient noncompliant with mostly everything... will rip off all cords Nonverbal Patient had eye doctor's appointment this morning and fell - bloody nose   CBG 122  146/86 90 HR 98% RA  Facility contact number [facility]: [phone]

## 2019-06-12 DIAGNOSIS — I10 Essential (primary) hypertension: Secondary | ICD-10-CM

## 2019-06-12 DIAGNOSIS — E039 Hypothyroidism, unspecified: Secondary | ICD-10-CM | POA: Diagnosis present

## 2019-06-12 DIAGNOSIS — K5900 Constipation, unspecified: Secondary | ICD-10-CM | POA: Diagnosis present

## 2019-06-12 DIAGNOSIS — K219 Gastro-esophageal reflux disease without esophagitis: Secondary | ICD-10-CM | POA: Diagnosis present

## 2019-06-12 DIAGNOSIS — E870 Hyperosmolality and hypernatremia: Secondary | ICD-10-CM | POA: Diagnosis present

## 2019-06-12 DIAGNOSIS — K7460 Unspecified cirrhosis of liver: Secondary | ICD-10-CM | POA: Diagnosis present

## 2019-06-12 DIAGNOSIS — M810 Age-related osteoporosis without current pathological fracture: Secondary | ICD-10-CM | POA: Diagnosis present

## 2019-06-12 DIAGNOSIS — Z79899 Other long term (current) drug therapy: Secondary | ICD-10-CM | POA: Diagnosis not present

## 2019-06-12 DIAGNOSIS — Z20822 Contact with and (suspected) exposure to covid-19: Secondary | ICD-10-CM | POA: Diagnosis present

## 2019-06-12 DIAGNOSIS — G40909 Epilepsy, unspecified, not intractable, without status epilepticus: Secondary | ICD-10-CM | POA: Diagnosis present

## 2019-06-12 DIAGNOSIS — K449 Diaphragmatic hernia without obstruction or gangrene: Secondary | ICD-10-CM | POA: Diagnosis present

## 2019-06-12 DIAGNOSIS — F0390 Unspecified dementia without behavioral disturbance: Secondary | ICD-10-CM | POA: Diagnosis present

## 2019-06-12 DIAGNOSIS — E878 Other disorders of electrolyte and fluid balance, not elsewhere classified: Secondary | ICD-10-CM | POA: Diagnosis present

## 2019-06-12 DIAGNOSIS — Z9181 History of falling: Secondary | ICD-10-CM | POA: Diagnosis not present

## 2019-06-12 DIAGNOSIS — E861 Hypovolemia: Secondary | ICD-10-CM | POA: Diagnosis present

## 2019-06-12 DIAGNOSIS — Q909 Down syndrome, unspecified: Secondary | ICD-10-CM

## 2019-06-12 DIAGNOSIS — E876 Hypokalemia: Secondary | ICD-10-CM | POA: Diagnosis present

## 2019-06-12 DIAGNOSIS — R636 Underweight: Secondary | ICD-10-CM | POA: Diagnosis present

## 2019-06-12 DIAGNOSIS — Z79890 Hormone replacement therapy (postmenopausal): Secondary | ICD-10-CM | POA: Diagnosis not present

## 2019-06-12 DIAGNOSIS — Z681 Body mass index (BMI) 19 or less, adult: Secondary | ICD-10-CM | POA: Diagnosis not present

## 2019-06-12 DIAGNOSIS — R630 Anorexia: Secondary | ICD-10-CM | POA: Diagnosis present

## 2019-06-12 DIAGNOSIS — Z7982 Long term (current) use of aspirin: Secondary | ICD-10-CM | POA: Diagnosis not present

## 2019-06-12 LAB — COMPREHENSIVE METABOLIC PANEL
ALT: 14 U/L (ref 0–44)
AST: 23 U/L (ref 15–41)
Albumin: 3 g/dL — ABNORMAL LOW (ref 3.5–5.0)
Alkaline Phosphatase: 77 U/L (ref 38–126)
Anion gap: 11 (ref 5–15)
BUN: 15 mg/dL (ref 8–23)
CO2: 26 mmol/L (ref 22–32)
Calcium: 7.8 mg/dL — ABNORMAL LOW (ref 8.9–10.3)
Chloride: 116 mmol/L — ABNORMAL HIGH (ref 98–111)
Creatinine, Ser: 0.7 mg/dL (ref 0.61–1.24)
GFR calc Af Amer: >60 mL/min (ref >60)
GFR calc non Af Amer: >60 mL/min (ref >60)
Glucose, Bld: 71 mg/dL (ref 70–99)
Potassium: 3.5 mmol/L (ref 3.5–5.1)
Sodium: 153 mmol/L — ABNORMAL HIGH (ref 135–145)
Total Bilirubin: 1.1 mg/dL (ref 0.3–1.2)
Total Protein: 6.6 g/dL (ref 6.5–8.1)

## 2019-06-12 LAB — CBC
HCT: 40.3 % (ref 39.0–52.0)
Hemoglobin: 12.4 g/dL — ABNORMAL LOW (ref 13.0–17.0)
MCH: 37.3 pg — ABNORMAL HIGH (ref 26.0–34.0)
MCHC: 30.8 g/dL (ref 30.0–36.0)
MCV: 121.4 fL — ABNORMAL HIGH (ref 80.0–100.0)
Platelets: 133 10*3/uL — ABNORMAL LOW (ref 150–400)
RBC: 3.32 MIL/uL — ABNORMAL LOW (ref 4.22–5.81)
RDW: 13.6 % (ref 11.5–15.5)
WBC: 4 10*3/uL (ref 4.0–10.5)
nRBC: 0 % (ref 0.0–0.2)

## 2019-06-12 LAB — BASIC METABOLIC PANEL
Anion gap: 6 (ref 5–15)
BUN: 11 mg/dL (ref 8–23)
CO2: 29 mmol/L (ref 22–32)
Calcium: 7.6 mg/dL — ABNORMAL LOW (ref 8.9–10.3)
Chloride: 112 mmol/L — ABNORMAL HIGH (ref 98–111)
Creatinine, Ser: 0.7 mg/dL (ref 0.61–1.24)
GFR calc Af Amer: >60 mL/min (ref >60)
GFR calc non Af Amer: >60 mL/min (ref >60)
Glucose, Bld: 134 mg/dL — ABNORMAL HIGH (ref 70–99)
Potassium: 3.5 mmol/L (ref 3.5–5.1)
Sodium: 147 mmol/L — ABNORMAL HIGH (ref 135–145)

## 2019-06-12 LAB — SARS CORONAVIRUS 2 (TAT 6-24 HRS): SARS Coronavirus 2: NEGATIVE

## 2019-06-12 LAB — HIV ANTIBODY (ROUTINE TESTING W REFLEX): HIV Screen 4th Generation wRfx: NONREACTIVE

## 2019-06-12 MED ORDER — LEVOTHYROXINE SODIUM 50 MCG PO TABS
50.0000 ug | ORAL_TABLET | Freq: Every day | ORAL | Status: DC
  Administered 2019-06-13 – 2019-06-16 (×4): 50 ug via ORAL
  Filled 2019-06-12 (×4): qty 1

## 2019-06-12 MED ORDER — VITAMIN D 25 MCG (1000 UNIT) PO TABS
2000.0000 [IU] | ORAL_TABLET | Freq: Every day | ORAL | Status: DC
  Administered 2019-06-12 – 2019-06-16 (×5): 2000 [IU] via ORAL
  Filled 2019-06-12 (×7): qty 2

## 2019-06-12 MED ORDER — CARBAMAZEPINE 100 MG PO CHEW
200.0000 mg | CHEWABLE_TABLET | Freq: Two times a day (BID) | ORAL | Status: DC
  Administered 2019-06-12 – 2019-06-16 (×9): 200 mg via ORAL
  Filled 2019-06-12 (×10): qty 2

## 2019-06-12 MED ORDER — DEXTROSE 5 % IV SOLN: INTRAVENOUS | Status: DC

## 2019-06-12 MED ORDER — METRONIDAZOLE 0.75 % EX GEL
Freq: Every evening | CUTANEOUS | Status: DC
  Filled 2019-06-12 (×2): qty 45

## 2019-06-12 MED ORDER — CALCIUM CARBONATE-VITAMIN D 600-400 MG-UNIT PO TABS: 1.0000 | ORAL_TABLET | Freq: Two times a day (BID) | ORAL | Status: DC

## 2019-06-12 MED ORDER — GLYCERIN-HYPROMELLOSE-PEG 400 0.2-0.36-1 % OP SOLN: 1.0000 [drp] | Freq: Three times a day (TID) | OPHTHALMIC | Status: DC

## 2019-06-12 MED ORDER — POLYVINYL ALCOHOL 1.4 % OP SOLN
1.0000 [drp] | Freq: Three times a day (TID) | OPHTHALMIC | Status: DC
  Administered 2019-06-12 – 2019-06-16 (×11): 1 [drp] via OPHTHALMIC
  Filled 2019-06-12: qty 15

## 2019-06-12 MED ORDER — CALCIUM CARBONATE-VITAMIN D 500-200 MG-UNIT PO TABS
1.0000 | ORAL_TABLET | Freq: Two times a day (BID) | ORAL | Status: DC
  Administered 2019-06-12 – 2019-06-16 (×9): 1 via ORAL
  Filled 2019-06-12 (×9): qty 1

## 2019-06-12 MED ORDER — POTASSIUM CHLORIDE CRYS ER 20 MEQ PO TBCR
40.0000 meq | EXTENDED_RELEASE_TABLET | Freq: Once | ORAL | Status: AC
  Administered 2019-06-12: 40 meq via ORAL
  Filled 2019-06-12: qty 2

## 2019-06-12 MED ORDER — ASPIRIN EC 81 MG PO TBEC
81.0000 mg | DELAYED_RELEASE_TABLET | Freq: Every day | ORAL | Status: DC
  Administered 2019-06-12 – 2019-06-16 (×5): 81 mg via ORAL
  Filled 2019-06-12 (×7): qty 1

## 2019-06-12 MED ORDER — ENSURE ENLIVE PO LIQD
237.0000 mL | Freq: Two times a day (BID) | ORAL | Status: DC
  Administered 2019-06-12 – 2019-06-16 (×6): 237 mL via ORAL

## 2019-06-12 MED ORDER — ADULT MULTIVITAMIN W/MINERALS CH
1.0000 | ORAL_TABLET | Freq: Every day | ORAL | Status: DC
  Administered 2019-06-12 – 2019-06-16 (×5): 1 via ORAL
  Filled 2019-06-12 (×5): qty 1

## 2019-06-12 MED ORDER — PANTOPRAZOLE SODIUM 40 MG PO TBEC
40.0000 mg | DELAYED_RELEASE_TABLET | Freq: Every day | ORAL | Status: DC
  Administered 2019-06-12 – 2019-06-16 (×5): 40 mg via ORAL
  Filled 2019-06-12 (×5): qty 1

## 2019-06-12 MED ORDER — LACTULOSE 10 GM/15ML PO SOLN
13.3000 g | Freq: Every day | ORAL | Status: DC
  Administered 2019-06-12 – 2019-06-16 (×4): 13.3 g via ORAL
  Filled 2019-06-12 (×5): qty 30

## 2019-06-12 MED ORDER — KETOCONAZOLE 2 % EX SHAM
1.0000 "application " | MEDICATED_SHAMPOO | CUTANEOUS | Status: DC
  Administered 2019-06-13 – 2019-06-16 (×2): 1 via TOPICAL
  Filled 2019-06-12: qty 120

## 2019-06-12 MED ORDER — CARBAMAZEPINE 200 MG PO TABS: 200.0000 mg | ORAL_TABLET | Freq: Two times a day (BID) | ORAL | Status: DC

## 2019-06-12 MED ORDER — METRONIDAZOLE 1 % EX GEL
1.0000 "application " | Freq: Every evening | CUTANEOUS | Status: DC
  Filled 2019-06-12: qty 1

## 2019-06-12 MED ORDER — LUBIPROSTONE 24 MCG PO CAPS
24.0000 ug | ORAL_CAPSULE | Freq: Every day | ORAL | Status: DC
  Administered 2019-06-12 – 2019-06-16 (×5): 24 ug via ORAL
  Filled 2019-06-12 (×5): qty 1

## 2019-06-12 NOTE — Progress Notes (Signed)
Initial Nutrition Assessment  DOCUMENTATION CODES:   Not applicable  INTERVENTION:  Continue Ensure Enlive po BID, each supplement provides 350 kcal and 20 grams of protein  Magic cup TID with meals, each supplement provides 290 kcal and 9 grams of protein  Vital Cuisine Shake with breakfast tray, each supplement provides 520 kcal and 22 grams of protein  MVI with minerals daily  Liberalize diet  Recommend feeding assistance with meals to encourage po intake  Recommend consideration of appetite stimulant  Patient is at risk for refeeding, recommend monitoring  magnesium, potassium, and phosphorus daily for at least 3 days, MD to replete as needed.  NUTRITION DIAGNOSIS:   Inadequate oral intake related to lethargy/confusion(dementia) as evidenced by meal completion < 50%, per patient/family report, energy intake < or equal to 50% for > or equal to 5 days, percent weight loss.  GOAL:   Patient will meet greater than or equal to 90% of their needs    MONITOR:   PO intake, Weight trends, Supplement acceptance, Labs  REASON FOR ASSESSMENT:   Malnutrition Screening Tool    ASSESSMENT:  64 year old male with past medical history of Down's syndrome, seizure disorder, HTN, hypothyroidism, osteoporosis, h/o anemia, presented for evaluation of bloody nose after a fall at the doctor's office. Fall work up negative, hypernatremia noted in ED.   Per notes, patient is non-verbal and resides in a group home. Staff at group home reported that pt has not been eating over the past 3 days, but drinking plenty of water and noted pt to be more agitated than usual (hard to redirect, restless, non cooperative)   Spoke with Rod Holler (facilty contact) She reports pt feeds himself at baseline, the facility provides 3 meals/day as well as snacks and usually pt has a good appetite. Rod Holler stated that she is not at facility on regular basis and recommended RD to contact [place] (pts RN) [phone].  Lawerance Bach reports patient has become less engaged, refusing meals, and more agitated over the past couple of weeks and feels pts dementia is progressing. She reports that staff has tried multiple avenues to encourage oral intake (providing favorite foods, feeding assistance, finger foods) with little improvement. Tabitha recalls pt likes ice cream, pudding, or pretty much anything sweet. Patient is provided Ensure supplement twice daily, RD will add Magic Cup with meals as well as Vital Cuisine Shake to breakfast tray. Patient is currently on [place] consuming 0-30% x 2 documented meals, recommend liberalizing diet to encourage po intake of meals.   Current wt 95.92 lbs Weights trending down over the past 6 months per history. On 7/04 pt wt 56.3 kg (123.86 lbs), on 9/17 pt wt 49.9 kg (109.78 lbs). Patient has lost 13.86 lbs (12.6%) in 3 months which is severe for time frame.  UBW 56.3 kg (123.86 lbs) 2018-2020  Medications reviewed and include: Oscal with D, Vit D3, Lactulose, Protonix D5 @ 100 ml/hr provides 408 kcal/day  Labs: BG 71,95, Na 153 (H), Albumin 3.0 (L), Corrected Ca 8.6 (L)  NUTRITION - FOCUSED PHYSICAL EXAM: Deferred   Diet Order:   Diet Order            Diet Heart Room service appropriate? Yes; Fluid consistency: Thin  Diet effective now              EDUCATION NEEDS:   Not appropriate for education at this time  Skin:  Skin Assessment: Reviewed RN Assessment  Last BM:  Unknown  Height:   Ht  Readings from Last 1 Encounters:  06/12/19 5' (1.524 m)    Weight:   Wt Readings from Last 1 Encounters:  06/12/19 43.6 kg    Ideal Body Weight:  48.2 kg  BMI:  Body mass index is 18.77 kg/m.  Estimated Nutritional Needs:   Kcal:  1300-1500  Protein:  65-75  Fluid:  >/= 1.3 L/day   Lars Masson, RD, LDN Clinical Nutrition Office [phone] After Hours/Weekend Pager: [phone]

## 2019-06-12 NOTE — Progress Notes (Signed)
PROGRESS NOTE    Travis Hanna  EPP:[id number] DOB: 07-11-1955 DOA: 06/11/2019 PCP: Trinidad Curet    Brief Narrative: HPI per Dr. Marlow Baars  is a 64 y.o. male,  w Downs syndrome, MR, Seizure disorder,  hypertension, hypothyroidism, osteoporosis, h/o anemia, presents with fall and bloody nose while at doctors office and sent to ER for evaluation.  Per ED, fall w/up negative , but patient has had poor oral intake and has hypernatremia.   In ED,  T 98.8, P 84, R 13, Bp 99/80  Pox 94% on RA  CT brain  IMPRESSION: No evidence of acute intracranial injury.  Increased parenchymal volume loss since 12/21/2018. Stable chronic microvascular ischemic changes.  Stable subcentimeter colloid cyst.  CT maxillofacial  IMPRESSION: No definite acute facial fracture. There is likely chronic deformity of the nasal bones and nasal septum.  CT abd/ pelvis IMPRESSION: 1. Large hiatal hernia. 2. Cirrhosis. 3. Marked severity levoscoliosis of the upper lumbar spine with multilevel degenerative changes.  CXR IMPRESSION: 1. No acute intrathoracic process.  Na 155, K 4.0, Bun 20, Creatinine 1.0 Alb 3.4 Ast 24, Alt 16 Wbc 5.7, hgb 13.4, Plt 137  Urinalysis pending  Pt will be admitted observation for hypernatremia.   Assessment & Plan:   Principal Problem:   Hypernatremia Active Problems:   Down syndrome   Hypothyroidism   Essential hypertension  1 Hypernatremia Secondary to a hypovolemic hyponatremia due to poor oral intake.  Patient sleeping soundly.  Urinalysis nitrite negative leukocytes negative with a specific gravity > 1.046.  Patient on half-normal saline with a sodium level of 153.  Will change IV fluids to D5W.  Follow.  2.  Seizure disorder Stable.  Resume Tegretol.  3.  Hypothyroidism TSH at 1.379.  Continue current regimen of Synthroid.  4.  Hypertension Stable.  5.  Down syndrome  6.  Gastroesophageal reflux disease PPI.  7.   Osteoporosis Continue Os-Cal with vitamin D.  Continue oral vitamin D supplementation.  Resume Fosamax on discharge.   DVT prophylaxis: Lovenox Code Status: Full Family Communication: No family at bedside. Disposition Plan:   Patient came from: Group home             Anticipated d/c place: Group home  Barriers to d/c OR conditions which need to be met to effect a safe d/c: Home once hyponatremia has improved with clinical improvement.   Consultants:   None  Procedures:   CT abdomen and pelvis 06/11/2019  CT head and maxillofacial 06/11/2019  Chest x-ray 06/11/2019  Antimicrobials:   None   Subjective: Patient asleep.  Objective: Vitals:   06/11/19 2243 06/11/19 2300 06/12/19 0544 06/12/19 0622  BP:  116/78  120/80  Pulse:  61  74  Resp:  18  18  Temp:  97.8 F (36.6 C)  98.9 F (37.2 C)  TempSrc:  Oral    SpO2:  99%  98%  Weight: 43.5 kg  43.6 kg     Intake/Output Summary (Last 24 hours) at 06/12/2019 1215 Last data filed at 06/12/2019 1100 Gross per 24 hour  Intake 1003.33 ml  Output --  Net 1003.33 ml   Filed Weights   06/11/19 2243 06/12/19 0544  Weight: 43.5 kg 43.6 kg    Examination:  General exam: NAD Respiratory system: CTAB anterior lung fields.  No wheezes, no crackles, no rhonchi.  Cardiovascular system: Regular rate rhythm no murmurs rubs or gallops.  No JVD.  No lower extremity edema.  Gastrointestinal  system: Abdomen is nondistended, soft and nontender. No organomegaly or masses felt. Normal bowel sounds heard. Central nervous system: Alert and oriented. No focal neurological deficits. Extremities: Symmetric 5 x 5 power. Skin: No rashes, lesions or ulcers Psychiatry: Judgement and insight appear normal. Mood & affect appropriate.     Data Reviewed: I have personally reviewed following labs and imaging studies  CBC: Recent Labs  Lab 06/11/19 1531 06/12/19 0304  WBC 5.7 4.0  NEUTROABS 4.1  --   HGB 13.4 12.4*  HCT 42.5 40.3  MCV  118.7* 121.4*  PLT 137* 631*   Basic Metabolic Panel: Recent Labs  Lab 06/11/19 1531 06/12/19 0304  NA 155* 153*  K 4.0 3.5  CL 115* 116*  CO2 32 26  GLUCOSE 95 71  BUN 20 15  CREATININE 1.00 0.70  CALCIUM 8.7* 7.8*   GFR: Estimated Creatinine Clearance: 59.8 mL/min (by C-G formula based on SCr of 0.7 mg/dL). Liver Function Tests: Recent Labs  Lab 06/11/19 1531 06/12/19 0304  AST 24 23  ALT 16 14  ALKPHOS 92 77  BILITOT 0.6 1.1  PROT 7.3 6.6  ALBUMIN 3.4* 3.0*   No results for input(s): LIPASE, AMYLASE in the last 168 hours. No results for input(s): AMMONIA in the last 168 hours. Coagulation Profile: No results for input(s): INR, PROTIME in the last 168 hours. Cardiac Enzymes: No results for input(s): CKTOTAL, CKMB, CKMBINDEX, TROPONINI in the last 168 hours. BNP (last 3 results) No results for input(s): PROBNP in the last 8760 hours. HbA1C: No results for input(s): HGBA1C in the last 72 hours. CBG: No results for input(s): GLUCAP in the last 168 hours. Lipid Profile: No results for input(s): CHOL, HDL, LDLCALC, TRIG, CHOLHDL, LDLDIRECT in the last 72 hours. Thyroid Function Tests: Recent Labs    06/11/19 2052  TSH 1.379   Anemia Panel: Recent Labs    06/11/19 2102  VITAMINB12 287   Sepsis Labs: No results for input(s): PROCALCITON, LATICACIDVEN in the last 168 hours.  Recent Results (from the past 240 hour(s))  SARS CORONAVIRUS 2 (TAT 6-24 HRS) Nasopharyngeal Urine, Clean Catch     Status: None   Collection Time: 06/11/19  8:39 PM   Specimen: Urine, Clean Catch; Nasopharyngeal  Result Value Ref Range Status   SARS Coronavirus 2 NEGATIVE NEGATIVE Final    Comment: (NOTE) SARS-CoV-2 target nucleic acids are NOT DETECTED. The SARS-CoV-2 RNA is generally detectable in upper and lower respiratory specimens during the acute phase of infection. Negative results do not preclude SARS-CoV-2 infection, do not rule out co-infections with other pathogens,  and should not be used as the sole basis for treatment or other patient management decisions. Negative results must be combined with clinical observations, patient history, and epidemiological information. The expected result is Negative. Fact Sheet for Patients: SugarRoll.be Fact Sheet for Healthcare Providers: https://www.woods-mathews.com/ This test is not yet approved or cleared by the [place] FDA and  has been authorized for detection and/or diagnosis of SARS-CoV-2 by FDA under an Emergency Use Authorization (EUA). This EUA will remain  in effect (meaning this test can be used) for the duration of the COVID-19 declaration under Section 56 4(b)(1) of the Act, 21 U.S.C. section 360bbb-3(b)(1), unless the authorization is terminated or revoked sooner. Performed at [facility], [place] [address] [place],  [address]          Radiology Studies: CT Head Wo Contrast  Result Date: 06/11/2019 CLINICAL DATA:  Fall EXAM: CT HEAD WITHOUT CONTRAST  TECHNIQUE: Contiguous axial images were obtained from the base of the skull through the vertex without intravenous contrast. COMPARISON:  12/21/2018 FINDINGS: Brain: There is no acute intracranial hemorrhage, mass-effect, or edema. Gray-white differentiation is preserved. There is no extra-axial fluid collection. Prominence of the ventricles and sulci reflects parenchymal volume loss, which appears increased. Patchy hypoattenuation in the supratentorial white matter is nonspecific but likely reflects stable chronic microvascular ischemic changes. A subcentimeter colloid cyst is again identified at the foramen of [place]. Vascular: There is mild atherosclerotic calcification at the skull base. Skull: Calvarium is unremarkable. Sinuses/Orbits: Mild retained secretions. No acute orbital abnormality. Other: None. IMPRESSION: No evidence of acute intracranial injury. Increased parenchymal volume loss  since 12/21/2018. Stable chronic microvascular ischemic changes. Stable subcentimeter colloid cyst. Electronically Signed   By: Macy Mis M.D.   On: 06/11/2019 18:33   CT Abdomen Pelvis W Contrast  Result Date: 06/11/2019 CLINICAL DATA:  Abdominal pain. Decreased appetite. EXAM: CT ABDOMEN AND PELVIS WITH CONTRAST TECHNIQUE: Multidetector CT imaging of the abdomen and pelvis was performed using the standard protocol following bolus administration of intravenous contrast. CONTRAST:  84m OMNIPAQUE IOHEXOL 300 MG/ML  SOLN COMPARISON:  June 25, 2013 FINDINGS: Lower chest: Mild atelectasis is seen within the bilateral lung bases. Hepatobiliary: The liver is cirrhotic in appearance. No focal liver abnormality is seen. No gallstones, gallbladder wall thickening, or biliary dilatation. Pancreas: Unremarkable. No pancreatic ductal dilatation or surrounding inflammatory changes. Spleen: Normal in size without focal abnormality. Adrenals/Urinary Tract: The right adrenal gland is normal in appearance. The left adrenal gland is poorly visualized. Kidneys are normal, without renal calculi, focal lesion, or hydronephrosis. Bladder is unremarkable. Stomach/Bowel: There is a large hiatal hernia. The appendix is not identified. No evidence of bowel dilatation. A large amount of stool is seen within the distal sigmoid colon and rectum. Vascular/Lymphatic: No significant vascular findings are present. No enlarged abdominal or pelvic lymph nodes. Reproductive: Prostate is unremarkable. Other: No abdominal wall hernia or abnormality. No abdominopelvic ascites. Musculoskeletal: There is marked severity levoscoliosis of the upper lumbar spine with multilevel degenerative changes. IMPRESSION: 1. Large hiatal hernia. 2. Cirrhosis. 3. Marked severity levoscoliosis of the upper lumbar spine with multilevel degenerative changes. Electronically Signed   By: TVirgina NorfolkM.D.   On: 06/11/2019 18:57   DG Chest Port 1  View  Result Date: 06/11/2019 CLINICAL DATA:  Anorexia, altered level of consciousness EXAM: PORTABLE CHEST 1 VIEW COMPARISON:  11/10/2018 FINDINGS: Single frontal view of the chest demonstrates an unremarkable cardiac silhouette. No airspace disease, effusion, or pneumothorax. Prominent skin fold overlies left lung. No acute bony abnormalities. IMPRESSION: 1. No acute intrathoracic process. Electronically Signed   By: MRanda NgoM.D.   On: 06/11/2019 16:26   CT Maxillofacial WO CM  Result Date: 06/11/2019 CLINICAL DATA:  Fall EXAM: CT MAXILLOFACIAL WITHOUT CONTRAST TECHNIQUE: Multidetector CT imaging of the maxillofacial structures was performed. Multiplanar CT image reconstructions were also generated. COMPARISON:  02/05/2019 FINDINGS: Osseous: There is no definite acute facial fracture. Chronic fracture deformity of the nasal bones and nasal septum. Orbits: No intraorbital hematoma.  Bilateral medial gaze deviation. Sinuses: Minor mucosal thickening with retained secretions in the sphenoid sinuses. Soft tissues: Negative. Limited intracranial: Dictated separately. IMPRESSION: No definite acute facial fracture. There is likely chronic deformity of the nasal bones and nasal septum. Electronically Signed   By: PMacy MisM.D.   On: 06/11/2019 18:38        Scheduled Meds:  aspirin  81 mg  Oral Daily   calcium-vitamin D  1 tablet Oral BID   carbamazepine  200 mg Oral BID   cholecalciferol  2,000 Units Oral Daily   enoxaparin (LOVENOX) injection  40 mg Subcutaneous QHS   feeding supplement (ENSURE ENLIVE)  237 mL Oral BID BM   [START ON 06/13/2019] ketoconazole  1 application Topical Q M,W,F   lactulose  13.3 g Oral Daily   [START ON 06/13/2019] levothyroxine  50 mcg Oral QAC breakfast   lubiprostone  24 mcg Oral Q breakfast   metroNIDAZOLE   Topical QPM   pantoprazole  40 mg Oral Daily   polyvinyl alcohol  1 drop Both Eyes TID   Continuous Infusions:  dextrose 100 mL/hr at  06/12/19 0920     LOS: 0 days    Time spent: 35 minutes    Irine Seal, MD [facility]   To contact the attending provider between 7A-7P or the covering provider during after hours 7P-7A, please log into the web site [email or web address] and access using universal [place] password for that web site. If you do not have the password, please call the hospital operator.  06/12/2019, 12:15 PM

## 2019-06-13 LAB — PHOSPHORUS: Phosphorus: 2 mg/dL — ABNORMAL LOW (ref 2.5–4.6)

## 2019-06-13 LAB — MAGNESIUM: Magnesium: 1.8 mg/dL (ref 1.7–2.4)

## 2019-06-13 MED ORDER — POTASSIUM PHOSPHATES 15 MMOLE/5ML IV SOLN
30.0000 mmol | Freq: Once | INTRAVENOUS | Status: AC
  Administered 2019-06-13: 30 mmol via INTRAVENOUS
  Filled 2019-06-13: qty 10

## 2019-06-13 NOTE — TOC Progression Note (Signed)
Transition of Care [facility]) - Progression Note    Patient Details  Name: BEAUMONT AUSTAD MRN: [id number] Date of Birth: 29-Dec-1955  [facility] CM/SW Contact  Geni Bers, RN Phone Number: 06/13/2019, 4:12 PM  Clinical Narrative:    Pt is from Group home [facility]. When pt is stable for discharge call the [facility] number in Epic.         Expected Discharge Plan and Services                                                 Social Determinants of Health (SDOH) Interventions    Readmission Risk Interventions No flowsheet data found.

## 2019-06-13 NOTE — Progress Notes (Addendum)
PROGRESS NOTE    Travis Hanna  ZOX:[id number] DOB: 29-Aug-1955 DOA: 06/11/2019 PCP: Trinidad Curet    Brief Narrative: HPI per Dr. Marlow Baars  is a 64 y.o. male,  w Downs syndrome, MR, Seizure disorder,  hypertension, hypothyroidism, osteoporosis, h/o anemia, presents with fall and bloody nose while at doctors office and sent to ER for evaluation.  Per ED, fall w/up negative , but patient has had poor oral intake and has hypernatremia.   In ED,  T 98.8, P 84, R 13, Bp 99/80  Pox 94% on RA  CT brain  IMPRESSION: No evidence of acute intracranial injury.  Increased parenchymal volume loss since 12/21/2018. Stable chronic microvascular ischemic changes.  Stable subcentimeter colloid cyst.  CT maxillofacial  IMPRESSION: No definite acute facial fracture. There is likely chronic deformity of the nasal bones and nasal septum.  CT abd/ pelvis IMPRESSION: 1. Large hiatal hernia. 2. Cirrhosis. 3. Marked severity levoscoliosis of the upper lumbar spine with multilevel degenerative changes.  CXR IMPRESSION: 1. No acute intrathoracic process.  Na 155, K 4.0, Bun 20, Creatinine 1.0 Alb 3.4 Ast 24, Alt 16 Wbc 5.7, hgb 13.4, Plt 137  Urinalysis pending  Pt will be admitted observation for hypernatremia.   Assessment & Plan:   Principal Problem:   Hypernatremia Active Problems:   Down syndrome   Hypothyroidism   Essential hypertension  1 Hypernatremia Secondary to a hypovolemic hypernatremia due to poor oral intake.  Patient alert today.  Following some commands.  Urinalysis nitrite negative leukocytes negative with a specific gravity > 1.046.  Improving.  Continue D5W.  Follow.    2.  Seizure disorder Continue Tegretol.  3.  Hypothyroidism TSH at 1.379.  Synthroid.  4.  Hypertension Stable.  Follow.    5.  Down syndrome  6.  Gastroesophageal reflux disease Continue PPI.  7.  Osteoporosis Continue Os-Cal with vitamin D.  Continue oral  vitamin D supplementation.  Resume Fosamax on discharge.  8.  Hypophosphatemia K-Phos 30 mmol IV x1.  Repeat labs in the morning.  9.  Underweight Patient's diet has been liberalized.  Continue nutritional supplementation.   DVT prophylaxis: Lovenox Code Status: Full Family Communication: No family at bedside. Disposition Plan:  . Patient came from: Group home            . Anticipated d/c place: Group home . Barriers to d/c OR conditions which need to be met to effect a safe d/c: Group Home once hypernatremia has improved with clinical improvement.   Consultants:   None  Procedures:   CT abdomen and pelvis 06/11/2019  CT head and maxillofacial 06/11/2019  Chest x-ray 06/11/2019  Antimicrobials:   None   Subjective: Patient alert.   Objective: Vitals:   06/12/19 2111 06/13/19 0430 06/13/19 0520 06/13/19 0537  BP: 118/85  (!) 99/56 114/84  Pulse: (!) 58  62 62  Resp: 18  18   Temp: 98.6 F (37 C)  (!) 97.4 F (36.3 C)   TempSrc:   Oral   SpO2: 98%  96%   Weight:  47.1 kg    Height:        Intake/Output Summary (Last 24 hours) at 06/13/2019 1150 Last data filed at 06/13/2019 0925 Gross per 24 hour  Intake 1930.73 ml  Output 175 ml  Net 1755.73 ml   Filed Weights   06/11/19 2243 06/12/19 0544 06/13/19 0430  Weight: 43.5 kg 43.6 kg 47.1 kg    Examination:  General exam: NAD  Respiratory system: Clear to auscultation bilaterally anterior lung fields.  No wheezes, no crackles, no rhonchi.  Normal respiratory effort.  Cardiovascular system: RRR no murmurs rubs or gallops.  No JVD.  No lower extremity edema.  Gastrointestinal system: Abdomen is soft, nontender, nondistended, positive bowel sounds.  No rebound.  No guarding. Central nervous system: Alert. No focal neurological deficits. Extremities: Symmetric 5 x 5 power. Skin: No rashes, lesions or ulcers Psychiatry: Judgement and insight appear poor. Mood & affect appropriate.     Data Reviewed: I have  personally reviewed following labs and imaging studies  CBC: Recent Labs  Lab 06/11/19 1531 06/12/19 0304  WBC 5.7 4.0  NEUTROABS 4.1  --   HGB 13.4 12.4*  HCT 42.5 40.3  MCV 118.7* 121.4*  PLT 137* 989*   Basic Metabolic Panel: Recent Labs  Lab 06/11/19 1531 06/12/19 0304 06/12/19 1852 06/13/19 0830  NA 155* 153* 147*  --   K 4.0 3.5 3.5  --   CL 115* 116* 112*  --   CO2 32 26 29  --   GLUCOSE 95 71 134*  --   BUN _0 --   CREATININE 1.00 0.70 0.70  --   CALCIUM 8.7* 7.8* 7.6*  --   MG  --   --   --  1.8  PHOS  --   --   --  2.0*   GFR: Estimated Creatinine Clearance: 64.6 mL/min (by C-G formula based on SCr of 0.7 mg/dL). Liver Function Tests: Recent Labs  Lab 06/11/19 1531 06/12/19 0304  AST 24 23  ALT 16 14  ALKPHOS 92 77  BILITOT 0.6 1.1  PROT 7.3 6.6  ALBUMIN 3.4* 3.0*   No results for input(s): LIPASE, AMYLASE in the last 168 hours. No results for input(s): AMMONIA in the last 168 hours. Coagulation Profile: No results for input(s): INR, PROTIME in the last 168 hours. Cardiac Enzymes: No results for input(s): CKTOTAL, CKMB, CKMBINDEX, TROPONINI in the last 168 hours. BNP (last 3 results) No results for input(s): PROBNP in the last 8760 hours. HbA1C: No results for input(s): HGBA1C in the last 72 hours. CBG: No results for input(s): GLUCAP in the last 168 hours. Lipid Profile: No results for input(s): CHOL, HDL, LDLCALC, TRIG, CHOLHDL, LDLDIRECT in the last 72 hours. Thyroid Function Tests: Recent Labs    06/11/19 2052  TSH 1.379   Anemia Panel: Recent Labs    06/11/19 2102  VITAMINB12 287   Sepsis Labs: No results for input(s): PROCALCITON, LATICACIDVEN in the last 168 hours.  Recent Results (from the past 240 hour(s))  SARS CORONAVIRUS 2 (TAT 6-24 HRS) Nasopharyngeal Urine, Clean Catch     Status: None   Collection Time: 06/11/19  8:39 PM   Specimen: Urine, Clean Catch; Nasopharyngeal  Result Value Ref Range Status   SARS  Coronavirus 2 NEGATIVE NEGATIVE Final    Comment: (NOTE) SARS-CoV-2 target nucleic acids are NOT DETECTED. The SARS-CoV-2 RNA is generally detectable in upper and lower respiratory specimens during the acute phase of infection. Negative results do not preclude SARS-CoV-2 infection, do not rule out co-infections with other pathogens, and should not be used as the sole basis for treatment or other patient management decisions. Negative results must be combined with clinical observations, patient history, and epidemiological information. The expected result is Negative. Fact Sheet for Patients: SugarRoll.be Fact Sheet for Healthcare Providers: https://www.woods-mathews.com/ This test is not yet approved or cleared by the [place] FDA and  has  been authorized for detection and/or diagnosis of SARS-CoV-2 by FDA under an Emergency Use Authorization (EUA). This EUA will remain  in effect (meaning this test can be used) for the duration of the COVID-19 declaration under Section 56 4(b)(1) of the Act, 21 U.S.C. section 360bbb-3(b)(1), unless the authorization is terminated or revoked sooner. Performed at [facility], [place] [address] [place], [place] [address]          Radiology Studies: CT Head Wo Contrast  Result Date: 06/11/2019 CLINICAL DATA:  Fall EXAM: CT HEAD WITHOUT CONTRAST TECHNIQUE: Contiguous axial images were obtained from the base of the skull through the vertex without intravenous contrast. COMPARISON:  12/21/2018 FINDINGS: Brain: There is no acute intracranial hemorrhage, mass-effect, or edema. Gray-white differentiation is preserved. There is no extra-axial fluid collection. Prominence of the ventricles and sulci reflects parenchymal volume loss, which appears increased. Patchy hypoattenuation in the supratentorial white matter is nonspecific but likely reflects stable chronic microvascular ischemic changes. A subcentimeter  colloid cyst is again identified at the foramen of [place]ro. Vascular: There is mild atherosclerotic calcification at the skull base. Skull: Calvarium is unremarkable. Sinuses/Orbits: Mild retained secretions. No acute orbital abnormality. Other: None. IMPRESSION: No evidence of acute intracranial injury. Increased parenchymal volume loss since 12/21/2018. Stable chronic microvascular ischemic changes. Stable subcentimeter colloid cyst. Electronically Signed   By: Macy Mis M.D.   On: 06/11/2019 18:33   CT Abdomen Pelvis W Contrast  Result Date: 06/11/2019 CLINICAL DATA:  Abdominal pain. Decreased appetite. EXAM: CT ABDOMEN AND PELVIS WITH CONTRAST TECHNIQUE: Multidetector CT imaging of the abdomen and pelvis was performed using the standard protocol following bolus administration of intravenous contrast. CONTRAST:  72m OMNIPAQUE IOHEXOL 300 MG/ML  SOLN COMPARISON:  June 25, 2013 FINDINGS: Lower chest: Mild atelectasis is seen within the bilateral lung bases. Hepatobiliary: The liver is cirrhotic in appearance. No focal liver abnormality is seen. No gallstones, gallbladder wall thickening, or biliary dilatation. Pancreas: Unremarkable. No pancreatic ductal dilatation or surrounding inflammatory changes. Spleen: Normal in size without focal abnormality. Adrenals/Urinary Tract: The right adrenal gland is normal in appearance. The left adrenal gland is poorly visualized. Kidneys are normal, without renal calculi, focal lesion, or hydronephrosis. Bladder is unremarkable. Stomach/Bowel: There is a large hiatal hernia. The appendix is not identified. No evidence of bowel dilatation. A large amount of stool is seen within the distal sigmoid colon and rectum. Vascular/Lymphatic: No significant vascular findings are present. No enlarged abdominal or pelvic lymph nodes. Reproductive: Prostate is unremarkable. Other: No abdominal wall hernia or abnormality. No abdominopelvic ascites. Musculoskeletal: There is marked  severity levoscoliosis of the upper lumbar spine with multilevel degenerative changes. IMPRESSION: 1. Large hiatal hernia. 2. Cirrhosis. 3. Marked severity levoscoliosis of the upper lumbar spine with multilevel degenerative changes. Electronically Signed   By: TVirgina NorfolkM.D.   On: 06/11/2019 18:57   DG Chest Port 1 View  Result Date: 06/11/2019 CLINICAL DATA:  Anorexia, altered level of consciousness EXAM: PORTABLE CHEST 1 VIEW COMPARISON:  11/10/2018 FINDINGS: Single frontal view of the chest demonstrates an unremarkable cardiac silhouette. No airspace disease, effusion, or pneumothorax. Prominent skin fold overlies left lung. No acute bony abnormalities. IMPRESSION: 1. No acute intrathoracic process. Electronically Signed   By: MRanda NgoM.D.   On: 06/11/2019 16:26   CT Maxillofacial WO CM  Result Date: 06/11/2019 CLINICAL DATA:  Fall EXAM: CT MAXILLOFACIAL WITHOUT CONTRAST TECHNIQUE: Multidetector CT imaging of the maxillofacial structures was performed. Multiplanar CT image reconstructions were also  generated. COMPARISON:  02/05/2019 FINDINGS: Osseous: There is no definite acute facial fracture. Chronic fracture deformity of the nasal bones and nasal septum. Orbits: No intraorbital hematoma.  Bilateral medial gaze deviation. Sinuses: Minor mucosal thickening with retained secretions in the sphenoid sinuses. Soft tissues: Negative. Limited intracranial: Dictated separately. IMPRESSION: No definite acute facial fracture. There is likely chronic deformity of the nasal bones and nasal septum. Electronically Signed   By: Macy Mis M.D.   On: 06/11/2019 18:38        Scheduled Meds: . aspirin EC  81 mg Oral Daily  . calcium-vitamin D  1 tablet Oral BID  . carbamazepine  200 mg Oral BID  . cholecalciferol  2,000 Units Oral Daily  . enoxaparin (LOVENOX) injection  40 mg Subcutaneous QHS  . feeding supplement (ENSURE ENLIVE)  237 mL Oral BID BM  . ketoconazole  1 application Topical  Q M,W,F  . lactulose  13.3 g Oral Daily  . levothyroxine  50 mcg Oral QAC breakfast  . lubiprostone  24 mcg Oral Q breakfast  . metroNIDAZOLE   Topical QPM  . multivitamin with minerals  1 tablet Oral Daily  . pantoprazole  40 mg Oral Daily  . polyvinyl alcohol  1 drop Both Eyes TID   Continuous Infusions: . dextrose 100 mL/hr at 06/13/19 0529  . potassium PHOSPHATE IVPB (in mmol)       LOS: 1 day    Time spent: 35 minutes    Irine Seal, MD [facility]   To contact the attending provider between 7A-7P or the covering provider during after hours 7P-7A, please log into the web site [email or web address] and access using universal [place] password for that web site. If you do not have the password, please call the hospital operator.  06/13/2019, 11:50 AM

## 2019-06-14 ENCOUNTER — Other Ambulatory Visit: Payer: Self-pay

## 2019-06-14 LAB — CBC
HCT: 36.7 % — ABNORMAL LOW (ref 39.0–52.0)
Hemoglobin: 12 g/dL — ABNORMAL LOW (ref 13.0–17.0)
MCH: 37.6 pg — ABNORMAL HIGH (ref 26.0–34.0)
MCHC: 32.7 g/dL (ref 30.0–36.0)
MCV: 115 fL — ABNORMAL HIGH (ref 80.0–100.0)
Platelets: 120 10*3/uL — ABNORMAL LOW (ref 150–400)
RBC: 3.19 MIL/uL — ABNORMAL LOW (ref 4.22–5.81)
RDW: 13.2 % (ref 11.5–15.5)
WBC: 2.7 10*3/uL — ABNORMAL LOW (ref 4.0–10.5)
nRBC: 0 % (ref 0.0–0.2)

## 2019-06-14 LAB — BASIC METABOLIC PANEL
Anion gap: 7 (ref 5–15)
BUN: 7 mg/dL — ABNORMAL LOW (ref 8–23)
CO2: 27 mmol/L (ref 22–32)
Calcium: 7.9 mg/dL — ABNORMAL LOW (ref 8.9–10.3)
Chloride: 105 mmol/L (ref 98–111)
Creatinine, Ser: 0.69 mg/dL (ref 0.61–1.24)
GFR calc Af Amer: >60 mL/min (ref >60)
GFR calc non Af Amer: >60 mL/min (ref >60)
Glucose, Bld: 108 mg/dL — ABNORMAL HIGH (ref 70–99)
Potassium: 3.4 mmol/L — ABNORMAL LOW (ref 3.5–5.1)
Sodium: 139 mmol/L (ref 135–145)

## 2019-06-14 LAB — PHOSPHORUS: Phosphorus: 4.2 mg/dL (ref 2.5–4.6)

## 2019-06-14 LAB — MAGNESIUM: Magnesium: 1.8 mg/dL (ref 1.7–2.4)

## 2019-06-14 MED ORDER — SODIUM CHLORIDE 0.45 % IV SOLN: INTRAVENOUS | Status: DC

## 2019-06-14 MED ORDER — POTASSIUM CHLORIDE 20 MEQ PO PACK
40.0000 meq | PACK | Freq: Once | ORAL | Status: AC
  Administered 2019-06-14: 40 meq via ORAL
  Filled 2019-06-14: qty 2

## 2019-06-14 MED ORDER — MAGNESIUM SULFATE 2 GM/50ML IV SOLN
2.0000 g | Freq: Once | INTRAVENOUS | Status: AC
  Administered 2019-06-14: 2 g via INTRAVENOUS
  Filled 2019-06-14: qty 50

## 2019-06-14 NOTE — Evaluation (Signed)
Physical Therapy Evaluation Patient Details Name: Travis Hanna MRN: [id number] DOB: [birth year] Today's Date: 06/14/2019   History of Present Illness  64 yo male admitted to ED 2/3 with fall, no acute abnormalities on imaging. PMH includes Downs syndrome, MR, Seizure disorder,  hypertension, hypothyroidism, osteoporosis, anemia.  Clinical Impression   Pt presents with restlessness with mobility, difficulty and resistance to bed-level mobility, poor sitting balance, and decreased activity tolerance suspect secondary to current affect and weakness. Pt to benefit from acute PT to address deficits. Pt required max-total +2 for supine<>sit and EOB sitting this session, further mobility not attempted due to pt restlessness verging on agitation. PT recommending return to group home with 24/7 assist if they are able to provide significant physical assist for pt, if not PT recommends SNF. PT to progress mobility as tolerated, and will continue to follow acutely.      Follow Up Recommendations Home health PT;SNF;Supervision/Assistance - 24 hour    Equipment Recommendations  Other (comment)(TBD)    Recommendations for Other Services       Precautions / Restrictions Precautions Precautions: Fall Restrictions Weight Bearing Restrictions: No      Mobility  Bed Mobility Overal bed mobility: Needs Assistance Bed Mobility: Supine to Sit;Sit to Supine     Supine to sit: Total assist;+2 for physical assistance;+2 for safety/equipment;HOB elevated Sit to supine: +2 for physical assistance;+2 for safety/equipment;HOB elevated;Max assist   General bed mobility comments: pt contributing 0% to transfer to EOB via helicopter technique, resistant to mobility. Requires max assist for return to supine for trunk lowering and positioning in bed with use of bed pad, pt lifting LEs into bed because he wanted to return to bed.  Transfers                 General transfer comment: unable to attempt -  pt resistant to further mobility  Ambulation/Gait                Stairs            Wheelchair Mobility    Modified Rankin (Stroke Patients Only)       Balance Overall balance assessment: Needs assistance;History of Falls Sitting-balance support: No upper extremity supported Sitting balance-Leahy Scale: Poor Sitting balance - Comments: max +2 to maintain sitting EOB       Standing balance comment: unable to assess                             Pertinent Vitals/Pain Pain Assessment: Faces Faces Pain Scale: Hurts a little bit Pain Location: generalized, during mobility Pain Descriptors / Indicators: Grimacing Pain Intervention(s): Limited activity within patient's tolerance;Monitored during session;Repositioned;Relaxation    Home Living Family/patient expects to be discharged to:: Group home Living Arrangements: Group Home                    Prior Function Level of Independence: Needs assistance   Gait / Transfers Assistance Needed: Pt uses gait belt and HHA, but as of recently pt has not been ambulatory PTA (per RN from Group home contact)  ADL's / Homemaking Assistance Needed: Pt states "yes" to receiving help with dressing, bathing, feeding        Hand Dominance   Dominant Hand: Right    Extremity/Trunk Assessment   Upper Extremity Assessment Upper Extremity Assessment: Difficult to assess due to impaired cognition    Lower Extremity Assessment Lower Extremity Assessment: Difficult to assess  due to impaired cognition(pt pulls LEs into ring sit, difficult to move out of this position)    Cervical / Trunk Assessment Cervical / Trunk Assessment: Other exceptions Cervical / Trunk Exceptions: difficult to assess, max assist for sitting balance  Communication   Communication: Expressive difficulties  Cognition Arousal/Alertness: Awake/alert Behavior During Therapy: Agitated;Restless Overall Cognitive Status: History of cognitive  impairments - at baseline                                 General Comments: History of intellectual disability, responds yes/no with nodding      General Comments      Exercises     Assessment/Plan    PT Assessment Patient needs continued PT services  PT Problem List Decreased mobility;Decreased activity tolerance;Decreased balance;Decreased knowledge of use of DME;Pain;Decreased cognition;Decreased safety awareness;Decreased strength       PT Treatment Interventions DME instruction;Therapeutic activities;Gait training;Therapeutic exercise;Patient/family education;Balance training;Functional mobility training;Neuromuscular re-education    PT Goals (Current goals can be found in the Care Plan section)  Acute Rehab PT Goals PT Goal Formulation: Patient unable to participate in goal setting Time For Goal Achievement: 06/28/19 Potential to Achieve Goals: Fair    Frequency Min 2X/week   Barriers to discharge        Co-evaluation               AM-PAC PT "6 Clicks" Mobility  Outcome Measure Help needed turning from your back to your side while in a flat bed without using bedrails?: Total Help needed moving from lying on your back to sitting on the side of a flat bed without using bedrails?: Total Help needed moving to and from a bed to a chair (including a wheelchair)?: Total Help needed standing up from a chair using your arms (e.g., wheelchair or bedside chair)?: Total Help needed to walk in hospital room?: Total Help needed climbing 3-5 steps with a railing? : Total 6 Click Score: 6    End of Session   Activity Tolerance: Treatment limited secondary to agitation Patient left: in bed;with call bell/phone within reach;with bed alarm set;with restraints reapplied Nurse Communication: Mobility status PT Visit Diagnosis: Muscle weakness (generalized) (M62.81);Other abnormalities of gait and mobility (R26.89)    Time: 8756-4332 PT Time Calculation (min)  (ACUTE ONLY): 16 min   Charges:   PT Evaluation $PT Eval Low Complexity: 1 Low          Bernetta Sutley E, PT Acute Rehabilitation Services Pager [phone]  Office [phone]  Danta Baumgardner D Elonda Husky 06/14/2019, 4:23 PM

## 2019-06-14 NOTE — Progress Notes (Addendum)
PROGRESS NOTE    Travis Hanna  [id number]:[id number] DOB: [birth year] DOA: 06/11/2019 PCP: Trinidad Curet    Brief Narrative: HPI per Dr. Marlow Baars  is a 64 y.o. male,  w Downs syndrome, MR, Seizure disorder,  hypertension, hypothyroidism, osteoporosis, h/o anemia, presents with fall and bloody nose while at doctors office and sent to ER for evaluation.  Per ED, fall w/up negative , but patient has had poor oral intake and has hypernatremia.   In ED,  T 98.8, P 84, R 13, Bp 99/80  Pox 94% on RA  CT brain  IMPRESSION: No evidence of acute intracranial injury.  Increased parenchymal volume loss since 12/21/2018. Stable chronic microvascular ischemic changes.  Stable subcentimeter colloid cyst.  CT maxillofacial  IMPRESSION: No definite acute facial fracture. There is likely chronic deformity of the nasal bones and nasal septum.  CT abd/ pelvis IMPRESSION: 1. Large hiatal hernia. 2. Cirrhosis. 3. Marked severity levoscoliosis of the upper lumbar spine with multilevel degenerative changes.  CXR IMPRESSION: 1. No acute intrathoracic process.  Na 155, K 4.0, Bun 20, Creatinine 1.0 Alb 3.4 Ast 24, Alt 16 Wbc 5.7, hgb 13.4, Plt 137  Urinalysis pending  Pt will be admitted observation for hypernatremia.   Assessment & Plan:   Principal Problem:   Hypernatremia Active Problems:   Down syndrome   Hypothyroidism   Essential hypertension  1 Hypernatremia Secondary to a hypovolemic hypernatremia due to poor oral intake.  Patient more alert.  Urinalysis nitrite negative leukocytes negative with a specific gravity > 1.046.  Improving on D5W. Sodium now at 139. Change IV fluids to half-normal saline. Follow.  2.  Seizure disorder Continue Tegretol.  3.  Hypothyroidism TSH at 1.379.  Synthroid.  4.  Hypertension Stable.  Follow.    5.  Down syndrome  6.  Gastroesophageal reflux disease PPI.  7.  Osteoporosis Continue Os-Cal with vitamin D.   Continue oral vitamin D supplementation.  Resume Fosamax on discharge.  8.  Hypophosphatemia Status post K-Phos 30 mmol IV x1. Phosphorus at 4.2.   9.  Underweight Patient's diet has been liberalized. Continue nutritional supplementation.  10. Hypokalemia Replete.   DVT prophylaxis: Lovenox Code Status: Full Family Communication: No family at bedside. Disposition Plan:  . Patient came from: Group home            . Anticipated d/c place: Group home . Barriers to d/c OR conditions which need to be met to effect a safe d/c: Group Home once hypernatremia has improved with clinical improvement.   Consultants:   None  Procedures:   CT abdomen and pelvis 06/11/2019  CT head and maxillofacial 06/11/2019  Chest x-ray 06/11/2019  Antimicrobials:   None   Subjective: Patient sleeping. Opens eyes to verbal stimuli. Nonverbal.  Objective: Vitals:   06/13/19 2056 06/14/19 0002 06/14/19 0446 06/14/19 0500  BP: 95/68 104/74 105/76   Pulse: 68 67 62   Resp: 18  20   Temp: 98.6 F (37 C)  98 F (36.7 C)   TempSrc: Oral  Oral   SpO2: 97%  97%   Weight:    48.2 kg  Height:        Intake/Output Summary (Last 24 hours) at 06/14/2019 1203 Last data filed at 06/14/2019 0647 Gross per 24 hour  Intake 2242.79 ml  Output 950 ml  Net 1292.79 ml   Filed Weights   06/12/19 0544 06/13/19 0430 06/14/19 0500  Weight: 43.6 kg 47.1 kg 48.2 kg  Examination:  General exam: NAD Respiratory system: CTAB anterior lung fields. No wheezes, no crackles, no rhonchi. Cardiovascular system: Regular rate rhythm no murmurs rubs or gallops. No JVD. No lower extremity edema. Gastrointestinal system: Abdomen is nontender, nondistended, soft, positive bowel sounds. No rebound. No guarding.  Central nervous system: Alert. No focal neurological deficits. Extremities: Symmetric 5 x 5 power. Skin: No rashes, lesions or ulcers Psychiatry: Judgement and insight appear poor. Mood & affect appropriate.      Data Reviewed: I have personally reviewed following labs and imaging studies  CBC: Recent Labs  Lab 06/11/19 1531 06/12/19 0304 06/14/19 0233  WBC 5.7 4.0 2.7*  NEUTROABS 4.1  --   --   HGB 13.4 12.4* 12.0*  HCT 42.5 40.3 36.7*  MCV 118.7* 121.4* 115.0*  PLT 137* 133* 410*   Basic Metabolic Panel: Recent Labs  Lab 06/11/19 1531 06/12/19 0304 06/12/19 1852 06/13/19 0830 06/14/19 0233  NA 155* 153* 147*  --  139  K 4.0 3.5 3.5  --  3.4*  CL 115* 116* 112*  --  105  CO2 32 26 29  --  27  GLUCOSE 95 71 134*  --  108*  BUN _0 --  7*  CREATININE 1.00 0.70 0.70  --  0.69  CALCIUM 8.7* 7.8* 7.6*  --  7.9*  MG  --   --   --  1.8 1.8  PHOS  --   --   --  2.0* 4.2   GFR: Estimated Creatinine Clearance: 66.1 mL/min (by C-G formula based on SCr of 0.69 mg/dL). Liver Function Tests: Recent Labs  Lab 06/11/19 1531 06/12/19 0304  AST 24 23  ALT 16 14  ALKPHOS 92 77  BILITOT 0.6 1.1  PROT 7.3 6.6  ALBUMIN 3.4* 3.0*   No results for input(s): LIPASE, AMYLASE in the last 168 hours. No results for input(s): AMMONIA in the last 168 hours. Coagulation Profile: No results for input(s): INR, PROTIME in the last 168 hours. Cardiac Enzymes: No results for input(s): CKTOTAL, CKMB, CKMBINDEX, TROPONINI in the last 168 hours. BNP (last 3 results) No results for input(s): PROBNP in the last 8760 hours. HbA1C: No results for input(s): HGBA1C in the last 72 hours. CBG: No results for input(s): GLUCAP in the last 168 hours. Lipid Profile: No results for input(s): CHOL, HDL, LDLCALC, TRIG, CHOLHDL, LDLDIRECT in the last 72 hours. Thyroid Function Tests: Recent Labs    06/11/19 2052  TSH 1.379   Anemia Panel: Recent Labs    06/11/19 2102  VITAMINB12 287   Sepsis Labs: No results for input(s): PROCALCITON, LATICACIDVEN in the last 168 hours.  Recent Results (from the past 240 hour(s))  SARS CORONAVIRUS 2 (TAT 6-24 HRS) Nasopharyngeal Urine, Clean Catch      Status: None   Collection Time: 06/11/19  8:39 PM   Specimen: Urine, Clean Catch; Nasopharyngeal  Result Value Ref Range Status   SARS Coronavirus 2 NEGATIVE NEGATIVE Final    Comment: (NOTE) SARS-CoV-2 target nucleic acids are NOT DETECTED. The SARS-CoV-2 RNA is generally detectable in upper and lower respiratory specimens during the acute phase of infection. Negative results do not preclude SARS-CoV-2 infection, do not rule out co-infections with other pathogens, and should not be used as the sole basis for treatment or other patient management decisions. Negative results must be combined with clinical observations, patient history, and epidemiological information. The expected result is Negative. Fact Sheet for Patients: SugarRoll.be Fact Sheet for Healthcare Providers: https://www.woods-mathews.com/  This test is not yet approved or cleared by the [place] and  has been authorized for detection and/or diagnosis of SARS-CoV-2 by FDA under an Emergency Use Authorization (EUA). This EUA will remain  in effect (meaning this test can be used) for the duration of the COVID-19 declaration under Section 56 4(b)(1) of the Act, 21 U.S.C. section 360bbb-3(b)(1), unless the authorization is terminated or revoked sooner. Performed at [facility], [place] [address] [place], [place] [address]          Radiology Studies: No results found.      Scheduled Meds: . aspirin EC  81 mg Oral Daily  . calcium-vitamin D  1 tablet Oral BID  . carbamazepine  200 mg Oral BID  . cholecalciferol  2,000 Units Oral Daily  . enoxaparin (LOVENOX) injection  40 mg Subcutaneous QHS  . feeding supplement (ENSURE ENLIVE)  237 mL Oral BID BM  . ketoconazole  1 application Topical Q M,W,F  . lactulose  13.3 g Oral Daily  . levothyroxine  50 mcg Oral QAC breakfast  . lubiprostone  24 mcg Oral Q breakfast  . metroNIDAZOLE   Topical QPM  .  multivitamin with minerals  1 tablet Oral Daily  . pantoprazole  40 mg Oral Daily  . polyvinyl alcohol  1 drop Both Eyes TID   Continuous Infusions: . sodium chloride 75 mL/hr at 06/14/19 0851     LOS: 2 days    Time spent: 35 minutes    Irine Seal, MD [facility]   To contact the attending provider between 7A-7P or the covering provider during after hours 7P-7A, please log into the web site [email or web address] and access using universal [place] password for that web site. If you do not have the password, please call the hospital operator.  06/14/2019, 12:03 PM

## 2019-06-15 LAB — BASIC METABOLIC PANEL
Anion gap: 11 (ref 5–15)
BUN: 11 mg/dL (ref 8–23)
CO2: 25 mmol/L (ref 22–32)
Calcium: 7.9 mg/dL — ABNORMAL LOW (ref 8.9–10.3)
Chloride: 105 mmol/L (ref 98–111)
Creatinine, Ser: 0.75 mg/dL (ref 0.61–1.24)
GFR calc Af Amer: >60 mL/min (ref >60)
GFR calc non Af Amer: >60 mL/min (ref >60)
Glucose, Bld: 70 mg/dL (ref 70–99)
Potassium: 4.3 mmol/L (ref 3.5–5.1)
Sodium: 141 mmol/L (ref 135–145)

## 2019-06-15 LAB — MAGNESIUM: Magnesium: 2 mg/dL (ref 1.7–2.4)

## 2019-06-15 NOTE — Progress Notes (Signed)
OT Cancellation Note  Patient Details Name: Travis Hanna MRN: [id number] DOB: 08/09/1955   Cancelled Treatment:     Attempt to see patient this AM however patient was sleeping soundly. Will re-attempt as schedule allows.  Myrtie Neither OT OT office: [phone]   Carmelia Roller 06/15/2019, 11:31 AM

## 2019-06-15 NOTE — Progress Notes (Signed)
PROGRESS NOTE    Travis Hanna  DQQ:[id number] DOB: Mar 07, 1956 DOA: 06/11/2019 PCP: Trinidad Curet    Brief Narrative: HPI per Dr. Marlow Baars  is a 64 y.o. male,  w Downs syndrome, MR, Seizure disorder,  hypertension, hypothyroidism, osteoporosis, h/o anemia, presents with fall and bloody nose while at doctors office and sent to ER for evaluation.  Per ED, fall w/up negative , but patient has had poor oral intake and has hypernatremia.   In ED,  T 98.8, P 84, R 13, Bp 99/80  Pox 94% on RA  CT brain  IMPRESSION: No evidence of acute intracranial injury.  Increased parenchymal volume loss since 12/21/2018. Stable chronic microvascular ischemic changes.  Stable subcentimeter colloid cyst.  CT maxillofacial  IMPRESSION: No definite acute facial fracture. There is likely chronic deformity of the nasal bones and nasal septum.  CT abd/ pelvis IMPRESSION: 1. Large hiatal hernia. 2. Cirrhosis. 3. Marked severity levoscoliosis of the upper lumbar spine with multilevel degenerative changes.  CXR IMPRESSION: 1. No acute intrathoracic process.  Na 155, K 4.0, Bun 20, Creatinine 1.0 Alb 3.4 Ast 24, Alt 16 Wbc 5.7, hgb 13.4, Plt 137  Urinalysis pending  Pt will be admitted observation for hypernatremia.   Assessment & Plan:   Principal Problem:   Hypernatremia Active Problems:   Down syndrome   Hypothyroidism   Essential hypertension  1 Hypernatremia Secondary to a hypovolemic hypernatremia due to poor oral intake.  Patient sleeping however easily arousable. Urinalysis nitrite negative leukocytes negative with a specific gravity > 1.046.  Improved on D5W. Currently on half-normal saline. Sodium level at 141. Saline lock IV fluids. Follow.  2.  Seizure disorder Continue Tegretol.  3.  Hypothyroidism TSH at 1.379.  Synthroid.  4.  Hypertension Stable.  Follow.    5.  Down syndrome  6.  Gastroesophageal reflux disease Continue PPI.  7.   Osteoporosis Continue Os-Cal with vitamin D.  Continue oral vitamin D supplementation.  Resume Fosamax on discharge.  8.  Hypophosphatemia Status post K-Phos 30 mmol IV x1. Phosphorus at 4.2.   9.  Underweight Patient's diet has been liberalized. Continue nutritional supplementation.  10. Hypokalemia Repleted.   DVT prophylaxis: Lovenox Code Status: Full Family Communication: No family at bedside. Updated brother via telephone yesterday evening 06/14/2019. Disposition Plan:  . Patient came from: Group home            . Anticipated d/c place: Group home . Barriers to d/c OR conditions which need to be met to effect a safe d/c: Group Home once hypernatremia has improved with clinical improvement hopefully tomorrow.   Consultants:   None  Procedures:   CT abdomen and pelvis 06/11/2019  CT head and maxillofacial 06/11/2019  Chest x-ray 06/11/2019  Antimicrobials:   None   Subjective: Patient sleeping however opens eyes to verbal stimuli. Nonverbal.   Objective: Vitals:   06/14/19 0500 06/14/19 1618 06/14/19 2045 06/15/19 0539  BP:  123/86 (!) 118/58 (!) 147/66  Pulse:  (!) 57 73 63  Resp:   14 12  Temp:   98.6 F (37 C) 97.8 F (36.6 C)  TempSrc:   Oral Oral  SpO2:   97%   Weight: 48.2 kg     Height:        Intake/Output Summary (Last 24 hours) at 06/15/2019 1049 Last data filed at 06/15/2019 1941 Gross per 24 hour  Intake 1234.98 ml  Output 800 ml  Net 434.98 ml   [employer]  06/12/19 0544 06/13/19 0430 06/14/19 0500  Weight: 43.6 kg 47.1 kg 48.2 kg    Examination:  General exam: NAD Respiratory system: Lungs clear to auscultation bilaterally anterior lung fields. No wheezes, no crackles, no rhonchi. Cardiovascular system: RRR no murmurs rubs or gallops. No JVD. No lower extremity edema. Gastrointestinal system: Abdomen is soft, nontender, nondistended, positive bowel sounds. No rebound. No guarding.  Central nervous system: Alert. No focal neurological  deficits. Extremities: Symmetric 5 x 5 power. Skin: No rashes, lesions or ulcers Psychiatry: Judgement and insight appear poor. Mood & affect appropriate.     Data Reviewed: I have personally reviewed following labs and imaging studies  CBC: Recent Labs  Lab 06/11/19 1531 06/12/19 0304 06/14/19 0233  WBC 5.7 4.0 2.7*  NEUTROABS 4.1  --   --   HGB 13.4 12.4* 12.0*  HCT 42.5 40.3 36.7*  MCV 118.7* 121.4* 115.0*  PLT 137* 133* 416*   Basic Metabolic Panel: Recent Labs  Lab 06/11/19 1531 06/12/19 0304 06/12/19 1852 06/13/19 0830 06/14/19 0233 06/15/19 0347  NA 155* 153* 147*  --  139  --   K 4.0 3.5 3.5  --  3.4*  --   CL 115* 116* 112*  --  105  --   CO2 32 26 29  --  27  --   GLUCOSE 95 71 134*  --  108*  --   BUN '20 15 11  ' --  7*  --   CREATININE 1.00 0.70 0.70  --  0.69  --   CALCIUM 8.7* 7.8* 7.6*  --  7.9*  --   MG  --   --   --  1.8 1.8 2.0  PHOS  --   --   --  2.0* 4.2  --    GFR: Estimated Creatinine Clearance: 66.1 mL/min (by C-G formula based on SCr of 0.69 mg/dL). Liver Function Tests: Recent Labs  Lab 06/11/19 1531 06/12/19 0304  AST 24 23  ALT 16 14  ALKPHOS 92 77  BILITOT 0.6 1.1  PROT 7.3 6.6  ALBUMIN 3.4* 3.0*   No results for input(s): LIPASE, AMYLASE in the last 168 hours. No results for input(s): AMMONIA in the last 168 hours. Coagulation Profile: No results for input(s): INR, PROTIME in the last 168 hours. Cardiac Enzymes: No results for input(s): CKTOTAL, CKMB, CKMBINDEX, TROPONINI in the last 168 hours. BNP (last 3 results) No results for input(s): PROBNP in the last 8760 hours. HbA1C: No results for input(s): HGBA1C in the last 72 hours. CBG: No results for input(s): GLUCAP in the last 168 hours. Lipid Profile: No results for input(s): CHOL, HDL, LDLCALC, TRIG, CHOLHDL, LDLDIRECT in the last 72 hours. Thyroid Function Tests: No results for input(s): TSH, T4TOTAL, FREET4, T3FREE, THYROIDAB in the last 72 hours. Anemia Panel:  No results for input(s): VITAMINB12, FOLATE, FERRITIN, TIBC, IRON, RETICCTPCT in the last 72 hours. Sepsis Labs: No results for input(s): PROCALCITON, LATICACIDVEN in the last 168 hours.  Recent Results (from the past 240 hour(s))  SARS CORONAVIRUS 2 (TAT 6-24 HRS) Nasopharyngeal Urine, Clean Catch     Status: None   Collection Time: 06/11/19  8:39 PM   Specimen: Urine, Clean Catch; Nasopharyngeal  Result Value Ref Range Status   SARS Coronavirus 2 NEGATIVE NEGATIVE Final    Comment: (NOTE) SARS-CoV-2 target nucleic acids are NOT DETECTED. The SARS-CoV-2 RNA is generally detectable in upper and lower respiratory specimens during the acute phase of infection. Negative results do not preclude SARS-CoV-2  infection, do not rule out co-infections with other pathogens, and should not be used as the sole basis for treatment or other patient management decisions. Negative results must be combined with clinical observations, patient history, and epidemiological information. The expected result is Negative. Fact Sheet for Patients: SugarRoll.be Fact Sheet for Healthcare Providers: https://www.woods-mathews.com/ This test is not yet approved or cleared by the [place] FDA and  has been authorized for detection and/or diagnosis of SARS-CoV-2 by FDA under an Emergency Use Authorization (EUA). This EUA will remain  in effect (meaning this test can be used) for the duration of the COVID-19 declaration under Section 56 4(b)(1) of the Act, 21 U.S.C. section 360bbb-3(b)(1), unless the authorization is terminated or revoked sooner. Performed at [facility], [place] [address] [place], [place] [address]          Radiology Studies: No results found.      Scheduled Meds: . aspirin EC  81 mg Oral Daily  . calcium-vitamin D  1 tablet Oral BID  . carbamazepine  200 mg Oral BID  . cholecalciferol  2,000 Units Oral Daily  . enoxaparin  (LOVENOX) injection  40 mg Subcutaneous QHS  . feeding supplement (ENSURE ENLIVE)  237 mL Oral BID BM  . ketoconazole  1 application Topical Q M,W,F  . lactulose  13.3 g Oral Daily  . levothyroxine  50 mcg Oral QAC breakfast  . lubiprostone  24 mcg Oral Q breakfast  . metroNIDAZOLE   Topical QPM  . multivitamin with minerals  1 tablet Oral Daily  . pantoprazole  40 mg Oral Daily  . polyvinyl alcohol  1 drop Both Eyes TID   Continuous Infusions:    LOS: 3 days    Time spent: 35 minutes    Irine Seal, MD [facility]   To contact the attending provider between 7A-7P or the covering provider during after hours 7P-7A, please log into the web site [email or web address] and access using universal [place] password for that web site. If you do not have the password, please call the hospital operator.  06/15/2019, 10:49 AM

## 2019-06-16 LAB — BASIC METABOLIC PANEL
Anion gap: 7 (ref 5–15)
BUN: 8 mg/dL (ref 8–23)
CO2: 30 mmol/L (ref 22–32)
Calcium: 8.1 mg/dL — ABNORMAL LOW (ref 8.9–10.3)
Chloride: 102 mmol/L (ref 98–111)
Creatinine, Ser: 0.81 mg/dL (ref 0.61–1.24)
GFR calc Af Amer: >60 mL/min (ref >60)
GFR calc non Af Amer: >60 mL/min (ref >60)
Glucose, Bld: 88 mg/dL (ref 70–99)
Potassium: 3.7 mmol/L (ref 3.5–5.1)
Sodium: 139 mmol/L (ref 135–145)

## 2019-06-16 MED ORDER — ADULT MULTIVITAMIN W/MINERALS CH: 1.0000 | ORAL_TABLET | Freq: Every day | ORAL | Status: DC

## 2019-06-16 MED ORDER — ENSURE ENLIVE PO LIQD: 237.0000 mL | Freq: Two times a day (BID) | ORAL | 12 refills | Status: DC

## 2019-06-16 NOTE — TOC Progression Note (Signed)
Transition of Care [facility]) - Progression Note    Patient Details  Name: HISAO DOO MRN: [id number] Date of Birth: 1955/06/17  [facility] CM/SW Contact  Geni Bers, RN Phone Number: 06/16/2019, 12:35 PM  Clinical Narrative:    Pt's Group Home RN called and can take pt back today. However, will not be able to pick him up until 2:30 - 3:00 PM related to facility giving COVID vaccinations. Group Home asked for discharge summary in order to have medications ordered. MD and pt's RN aware.        Expected Discharge Plan and Services                                                 Social Determinants of Health (SDOH) Interventions    Readmission Risk Interventions No flowsheet data found.

## 2019-06-16 NOTE — TOC Progression Note (Addendum)
Transition of Care [facility]) - Progression Note    Patient Details  Name: Travis Hanna MRN: [id number] Date of Birth: December 20, 1955  Transition [facility]) CM/SW Contact  Geni Bers, RN Phone Number: 06/16/2019, 10:40 AM  Clinical Narrative:    Spoke with Aggie Cosier, RN at [facility], concerning pt's return. Aggie Cosier asked for H&P and MD notes. She was also made aware that the pt had been in restraint. Aggie Cosier also states that she will speak with her supervisor after reviewing notes for pt's return.  Faxed clinicals to [phone]. Waiting for response.         Expected Discharge Plan and Services                                                 Social Determinants of Health (SDOH) Interventions    Readmission Risk Interventions No flowsheet data found.

## 2019-06-16 NOTE — Discharge Summary (Signed)
Physician Discharge Summary  Travis Hanna TFT:[id number] DOB: 08-Dec-1955 DOA: 06/11/2019  PCP: Lucretia Field  Admit date: 06/11/2019 Discharge date: 06/16/2019  Time spent: 55 minutes  Recommendations for Outpatient Follow-up:  1. Follow-up with [facility] Began in 1 to 2 weeks.  On follow-up patient will need a basic metabolic profile done to follow-up on electrolytes and renal function as well as a phosphorus level and a magnesium level. 2. Patient be discharged back to his group home and will need PT/OT to evaluate and treat.   Discharge Diagnoses:  Principal Problem:   Hypernatremia Active Problems:   Down syndrome   Hypothyroidism   Essential hypertension   Discharge Condition: Stable and improved  Diet recommendation: Regular  Filed Weights   06/13/19 0430 06/14/19 0500 06/16/19 0500  Weight: 47.1 kg 48.2 kg 45.7 kg    History of present illness:  HPI per Dr. Sofie Rower  is a 64 y.o. male,  w Downs syndrome, MR, Seizure disorder,  hypertension, hypothyroidism, osteoporosis, h/o anemia, presented with fall and bloody nose while at doctors office and sent to ER for evaluation.  Per ED, fall w/up negative , but patient has had poor oral intake and has hypernatremia.   In ED,  T 98.8, P 84, R 13, Bp 99/80  Pox 94% on RA  CT brain  IMPRESSION: No evidence of acute intracranial injury.  Increased parenchymal volume loss since 12/21/2018. Stable chronic microvascular ischemic changes.  Stable subcentimeter colloid cyst.  CT maxillofacial  IMPRESSION: No definite acute facial fracture. There is likely chronic deformity of the nasal bones and nasal septum.  CT abd/ pelvis IMPRESSION: 1. Large hiatal hernia. 2. Cirrhosis. 3. Marked severity levoscoliosis of the upper lumbar spine with multilevel degenerative changes.  CXR IMPRESSION: 1. No acute intrathoracic process.  Na 155, K 4.0, Bun 20, Creatinine 1.0 Alb 3.4 Ast 24, Alt 16 Wbc 5.7,  hgb 13.4, Plt 137  Urinalysis pending  Pt will be admitted observation for hypernatremia.   Hospital Course:  1 Hypernatremia Secondary to a hypovolemic hypernatremia due to poor oral intake.  Urinalysis which was done was nitrite negative leukocytes negative with a specific gravity > 1.046.  Patient was initially placed on have normal saline on admission and subsequently changed to D5W.  Patient's hypernatremia improved on a daily basis and had resolved by day of discharge.   IV fluids with saline locked sodium levels remained stable and patient was tolerating approximately 50% of his diet.  Sodium level was 139 by day of discharge from 155 on admission.  Patient will be discharged in stable and improved condition.    2.  Seizure disorder Patient maintained on home regimen of Tegretol.  No noted seizures during this hospitalization.   3.  Hypothyroidism TSH at 1.379.    Patient maintained on home regimen of Synthroid.  4.  Hypertension Blood pressure remained stable.    5.  Down syndrome  6.  Gastroesophageal reflux disease Patient was maintained on a PPI during the hospitalization.  7.  Osteoporosis Patient maintained on Os-Cal with vitamin D.  Fosamax was held during the hospitalization and will be resumed on discharge.  Outpatient follow-up.   8.  Hypophosphatemia Status post K-Phos 30 mmol IV x1. Phosphorus at 4.2.   9.  Underweight Patient's diet has been liberalized.  Patient placed on nutritional supplementation.  Outpatient follow-up with PCP.    10. Hypokalemia Repleted.  Outpatient follow-up.  Procedures:  CT abdomen and pelvis 06/11/2019  CT head and maxillofacial 06/11/2019  Chest x-ray 06/11/2019  Consultations:  None  Discharge Exam: Vitals:   06/16/19 0501 06/16/19 1009  BP: 113/74 107/69  Pulse: 63 61  Resp: 18 18  Temp: 98.6 F (37 C) 98.2 F (36.8 C)  SpO2: 92% 99%    General: NAD Cardiovascular: RRR Respiratory: CTAB anterior  lung fields.  Discharge Instructions   Discharge Instructions    Diet general   Complete by: As directed    Increase activity slowly   Complete by: As directed      Allergies as of 06/16/2019      Reactions   Carbapenems Other (See Comments)   unknown   Cephalosporins Other (See Comments)   unknown   Other Other (See Comments)   Allergic to Betalactams per group home-unknown reaction   Penicillamine Other (See Comments)   unknown   Penicillins Other (See Comments)   unknown      Medication List    TAKE these medications   alendronate 70 MG tablet Commonly known as: FOSAMAX Take 70 mg by mouth every Friday. Take with a full glass of water on an empty stomach.   aspirin 81 MG EC tablet Take 1 tablet (81 mg total) by mouth daily.   Calcium Carbonate-Vitamin D 600-400 MG-UNIT tablet Take 1 tablet by mouth 2 (two) times daily.   carbamazepine 100 MG chewable tablet Commonly known as: TEGRETOL Chew 200 mg by mouth 2 (two) times daily.   cetaphil lotion Apply 1 application topically daily. Apply topically to face   esomeprazole 40 MG capsule Commonly known as: NEXIUM Take 40 mg by mouth at bedtime.   feeding supplement (ENSURE ENLIVE) Liqd Take 237 mLs by mouth 2 (two) times daily between meals.   ketoconazole 2 % shampoo Commonly known as: NIZORAL Apply 1 application topically every Monday, Wednesday, and Friday.   lactulose 10 GM/15ML solution Commonly known as: CHRONULAC Take 13.3 g by mouth daily.   lubiprostone 24 MCG capsule Commonly known as: AMITIZA Take 24 mcg by mouth daily with breakfast.   metroNIDAZOLE 1 % gel Commonly known as: METROGEL Apply 1 application topically every evening. Apply to face   mometasone 0.1 % cream Commonly known as: ELOCON Apply 1 application topically See admin instructions. Apply sparingly to nasal bridge every other day   multivitamin with minerals Tabs tablet Take 1 tablet by mouth daily. Start taking on:  June 17, 2019   PHISODERM NORMAL TO OILY EX Apply 1 application topically daily. To face   Synthroid 50 MCG tablet Generic drug: levothyroxine Take 50 mcg by mouth daily before breakfast.   Visine Tears 0.2-0.36-1 % Soln Generic drug: Glycerin-Hypromellose-PEG 400 Place 1 drop into both eyes 3 (three) times daily.   Vitamin D 50 MCG (2000 UT) tablet Take 2,000 Units by mouth daily.      Allergies  Allergen Reactions  . Carbapenems Other (See Comments)    unknown  . Cephalosporins Other (See Comments)    unknown  . Other Other (See Comments)    Allergic to Betalactams per group home-unknown reaction   . Penicillamine Other (See Comments)    unknown  . Penicillins Other (See Comments)    unknown   Follow-up Information    [facility] [place]. Schedule an appointment as soon as possible for a visit in 1 week(s).   Specialty: Family Medicine Why: f/u in 1-2 weeks. Contact information: [address] [place] [address] [phone]  The results of significant diagnostics from this hospitalization (including imaging, microbiology, ancillary and laboratory) are listed below for reference.    Significant Diagnostic Studies: CT Head Wo Contrast  Result Date: 06/11/2019 CLINICAL DATA:  Fall EXAM: CT HEAD WITHOUT CONTRAST TECHNIQUE: Contiguous axial images were obtained from the base of the skull through the vertex without intravenous contrast. COMPARISON:  12/21/2018 FINDINGS: Brain: There is no acute intracranial hemorrhage, mass-effect, or edema. Gray-white differentiation is preserved. There is no extra-axial fluid collection. Prominence of the ventricles and sulci reflects parenchymal volume loss, which appears increased. Patchy hypoattenuation in the supratentorial white matter is nonspecific but likely reflects stable chronic microvascular ischemic changes. A subcentimeter colloid cyst is again identified at the foramen of Monro. Vascular: There is  mild atherosclerotic calcification at the skull base. Skull: Calvarium is unremarkable. Sinuses/Orbits: Mild retained secretions. No acute orbital abnormality. Other: None. IMPRESSION: No evidence of acute intracranial injury. Increased parenchymal volume loss since 12/21/2018. Stable chronic microvascular ischemic changes. Stable subcentimeter colloid cyst. Electronically Signed   By: Guadlupe Spanish M.D.   On: 06/11/2019 18:33   CT Abdomen Pelvis W Contrast  Result Date: 06/11/2019 CLINICAL DATA:  Abdominal pain. Decreased appetite. EXAM: CT ABDOMEN AND PELVIS WITH CONTRAST TECHNIQUE: Multidetector CT imaging of the abdomen and pelvis was performed using the standard protocol following bolus administration of intravenous contrast. CONTRAST:  62mL OMNIPAQUE IOHEXOL 300 MG/ML  SOLN COMPARISON:  June 25, 2013 FINDINGS: Lower chest: Mild atelectasis is seen within the bilateral lung bases. Hepatobiliary: The liver is cirrhotic in appearance. No focal liver abnormality is seen. No gallstones, gallbladder wall thickening, or biliary dilatation. Pancreas: Unremarkable. No pancreatic ductal dilatation or surrounding inflammatory changes. Spleen: Normal in size without focal abnormality. Adrenals/Urinary Tract: The right adrenal gland is normal in appearance. The left adrenal gland is poorly visualized. Kidneys are normal, without renal calculi, focal lesion, or hydronephrosis. Bladder is unremarkable. Stomach/Bowel: There is a large hiatal hernia. The appendix is not identified. No evidence of bowel dilatation. A large amount of stool is seen within the distal sigmoid colon and rectum. Vascular/Lymphatic: No significant vascular findings are present. No enlarged abdominal or pelvic lymph nodes. Reproductive: Prostate is unremarkable. Other: No abdominal wall hernia or abnormality. No abdominopelvic ascites. Musculoskeletal: There is marked severity levoscoliosis of the upper lumbar spine with multilevel degenerative  changes. IMPRESSION: 1. Large hiatal hernia. 2. Cirrhosis. 3. Marked severity levoscoliosis of the upper lumbar spine with multilevel degenerative changes. Electronically Signed   By: Aram Candela M.D.   On: 06/11/2019 18:57   DG Chest Port 1 View  Result Date: 06/11/2019 CLINICAL DATA:  Anorexia, altered level of consciousness EXAM: PORTABLE CHEST 1 VIEW COMPARISON:  11/10/2018 FINDINGS: Single frontal view of the chest demonstrates an unremarkable cardiac silhouette. No airspace disease, effusion, or pneumothorax. Prominent skin fold overlies left lung. No acute bony abnormalities. IMPRESSION: 1. No acute intrathoracic process. Electronically Signed   By: Sharlet Salina M.D.   On: 06/11/2019 16:26   CT Maxillofacial WO CM  Result Date: 06/11/2019 CLINICAL DATA:  Fall EXAM: CT MAXILLOFACIAL WITHOUT CONTRAST TECHNIQUE: Multidetector CT imaging of the maxillofacial structures was performed. Multiplanar CT image reconstructions were also generated. COMPARISON:  02/05/2019 FINDINGS: Osseous: There is no definite acute facial fracture. Chronic fracture deformity of the nasal bones and nasal septum. Orbits: No intraorbital hematoma.  Bilateral medial gaze deviation. Sinuses: Minor mucosal thickening with retained secretions in the sphenoid sinuses. Soft tissues: Negative. Limited intracranial: Dictated separately. IMPRESSION: No definite acute  facial fracture. There is likely chronic deformity of the nasal bones and nasal septum. Electronically Signed   By: Guadlupe Spanish M.D.   On: 06/11/2019 18:38    Microbiology: Recent Results (from the past 240 hour(s))  SARS CORONAVIRUS 2 (TAT 6-24 HRS) Nasopharyngeal Urine, Clean Catch     Status: None   Collection Time: 06/11/19  8:39 PM   Specimen: Urine, Clean Catch; Nasopharyngeal  Result Value Ref Range Status   SARS Coronavirus 2 NEGATIVE NEGATIVE Final    Comment: (NOTE) SARS-CoV-2 target nucleic acids are NOT DETECTED. The SARS-CoV-2 RNA is generally  detectable in upper and lower respiratory specimens during the acute phase of infection. Negative results do not preclude SARS-CoV-2 infection, do not rule out co-infections with other pathogens, and should not be used as the sole basis for treatment or other patient management decisions. Negative results must be combined with clinical observations, patient history, and epidemiological information. The expected result is Negative. Fact Sheet for Patients: HairSlick.no Fact Sheet for Healthcare Providers: quierodirigir.com This test is not yet approved or cleared by the [place] FDA and  has been authorized for detection and/or diagnosis of SARS-CoV-2 by FDA under an Emergency Use Authorization (EUA). This EUA will remain  in effect (meaning this test can be used) for the duration of the COVID-19 declaration under Section 56 4(b)(1) of the Act, 21 U.S.C. section 360bbb-3(b)(1), unless the authorization is terminated or revoked sooner. Performed at [facility], [address] [place], [place] [address]      Labs: Basic Metabolic Panel: Recent Labs  Lab 06/12/19 0304 06/12/19 1852 06/13/19 0830 06/14/19 0233 06/15/19 0347 06/16/19 0405  NA 153* 147*  --  139 141 139  K 3.5 3.5  --  3.4* 4.3 3.7  CL 116* 112*  --  105 105 102  CO2 26 29  --  27 25 30   GLUCOSE 71 134*  --  108* 70 88  BUN 15 11  --  7* 11 8  CREATININE 0.70 0.70  --  0.69 0.75 0.81  CALCIUM 7.8* 7.6*  --  7.9* 7.9* 8.1*  MG  --   --  1.8 1.8 2.0  --   PHOS  --   --  2.0* 4.2  --   --    Liver Function Tests: Recent Labs  Lab 06/11/19 1531 06/12/19 0304  AST 24 23  ALT 16 14  ALKPHOS 92 77  BILITOT 0.6 1.1  PROT 7.3 6.6  ALBUMIN 3.4* 3.0*   No results for input(s): LIPASE, AMYLASE in the last 168 hours. No results for input(s): AMMONIA in the last 168 hours. CBC: Recent Labs  Lab 06/11/19 1531 06/12/19 0304 06/14/19 0233  WBC  5.7 4.0 2.7*  NEUTROABS 4.1  --   --   HGB 13.4 12.4* 12.0*  HCT 42.5 40.3 36.7*  MCV 118.7* 121.4* 115.0*  PLT 137* 133* 120*   Cardiac Enzymes: No results for input(s): CKTOTAL, CKMB, CKMBINDEX, TROPONINI in the last 168 hours. BNP: BNP (last 3 results) No results for input(s): BNP in the last 8760 hours.  ProBNP (last 3 results) No results for input(s): PROBNP in the last 8760 hours.  CBG: No results for input(s): GLUCAP in the last 168 hours.     Signed:  08/12/19 MD.  [facility] 06/16/2019, 12:46 PM

## 2019-06-16 NOTE — TOC Progression Note (Signed)
Transition of Care [facility]) - Progression Note    Patient Details  Name: Travis Hanna MRN: [id number] Date of Birth: 07/26/1955  Transition of Care [facility]) CM/SW Contact  Geni Bers, RN Phone Number: 06/16/2019, 12:22 PM  Clinical Narrative:    A call was made to check with RN to see if pt could return. RN is now given COVID injections and could not come to the telephone.         Expected Discharge Plan and Services                                                 Social Determinants of Health (SDOH) Interventions    Readmission Risk Interventions No flowsheet data found.

## 2019-06-25 ENCOUNTER — Encounter: Payer: Self-pay | Admitting: Gastroenterology

## 2019-07-23 ENCOUNTER — Encounter: Payer: Self-pay | Admitting: Gastroenterology

## 2019-07-23 ENCOUNTER — Ambulatory Visit (INDEPENDENT_AMBULATORY_CARE_PROVIDER_SITE_OTHER): Payer: MEDICARE | Admitting: Gastroenterology

## 2019-07-23 VITALS — BP 102/70 | HR 52 | Temp 98.3°F

## 2019-07-23 DIAGNOSIS — K7460 Unspecified cirrhosis of liver: Secondary | ICD-10-CM | POA: Diagnosis not present

## 2019-07-23 NOTE — Progress Notes (Signed)
HPI: This is a 64 year old man with Down syndrome who I am meeting for the first time today.  He is with a caregiver from his group home.  I am not sure who referred him here but he was sent for evaluation of recently discovered cirrhosis  He is noncommunicative.  He is sitting in wheelchair.  His caregiver says that he can feed himself but not really ambulate.  He is unable to give any history.  He was recently admitted for hypernatremia and was found to have cirrhosis during work-up.  Old Data Reviewed:  CT scan abdomen pelvis with IV and oral contrast February 2021 shows a "large hiatal hernia, cirrhosis and scoliosis."  Blood work February 2021 shows platelets 120, hemoglobin 12, white blood cell count 2.7, sodium was recently in the 150s, albumin 3.0, otherwise LFTs were all normal   Review of systems: Pertinent positive and negative review of systems were noted in the above HPI section. All other review negative.   Past Medical History:  Diagnosis Date  . Anemia   . Arthritis   . Down syndrome   . Frequent falls   . Hypertension    now with hypotension  . Hypothyroidism   . MR (mental retardation)   . Myopia with astigmatism   . Osteoporosis   . Seizure disorder [facility])     Past Surgical History:  Procedure Laterality Date  . CLOSED REDUCTION NASAL FRACTURE N/A 02/18/2019   Procedure: CLOSED REDUCTION NASAL FRACTURE;  Surgeon: Glenna Fellows, MD;  Location: [facility] OR;  Service: Plastics;  Laterality: N/A;    Current Outpatient Medications  Medication Sig Dispense Refill  . alendronate (FOSAMAX) 70 MG tablet Take 70 mg by mouth every Friday. Take with a full glass of water on an empty stomach.    Marland Kitchen aspirin EC 81 MG EC tablet Take 1 tablet (81 mg total) by mouth daily.    . Calcium Carbonate-Vitamin D 600-400 MG-UNIT tablet Take 1 tablet by mouth 2 (two) times daily.    . carbamazepine (TEGRETOL) 100 MG chewable tablet Chew 200 mg by mouth 2 (two) times daily.      . cetaphil (CETAPHIL) lotion Apply 1 application topically daily. Apply topically to face    . Cholecalciferol (VITAMIN D) 50 MCG (2000 UT) tablet Take 2,000 Units by mouth daily.    Marland Kitchen esomeprazole (NEXIUM) 40 MG capsule Take 40 mg by mouth at bedtime.    . feeding supplement, ENSURE ENLIVE, (ENSURE ENLIVE) LIQD Take 237 mLs by mouth 2 (two) times daily between meals. 237 mL 12  . Glycerin-Hypromellose-PEG 400 (VISINE TEARS) 0.2-0.36-1 % SOLN Place 1 drop into both eyes 3 (three) times daily.    Marland Kitchen ketoconazole (NIZORAL) 2 % shampoo Apply 1 application topically every Monday, Wednesday, and Friday.     . lactulose (CHRONULAC) 10 GM/15ML solution Take 13.3 g by mouth daily.     Marland Kitchen levothyroxine (SYNTHROID) 50 MCG tablet Take 50 mcg by mouth daily before breakfast.     . lubiprostone (AMITIZA) 24 MCG capsule Take 24 mcg by mouth daily with breakfast.     . metroNIDAZOLE (METROGEL) 1 % gel Apply 1 application topically every evening. Apply to face    . mometasone (ELOCON) 0.1 % cream Apply 1 application topically See admin instructions. Apply sparingly to nasal bridge every other day    . Multiple Vitamin (MULTIVITAMIN WITH MINERALS) TABS tablet Take 1 tablet by mouth daily.    Marland Kitchen Soap & Cleansers (PHISODERM NORMAL TO  OILY EX) Apply 1 application topically daily. To face     No current facility-administered medications for this visit.    Allergies as of 07/23/2019 - Review Complete 07/23/2019  Allergen Reaction Noted  . Carbapenems Other (See Comments) 04/09/2011  . Cephalosporins Other (See Comments)   . Other Other (See Comments) 02/14/2014  . Penicillamine Other (See Comments) 02/14/2014  . Penicillins Other (See Comments) 04/09/2011    Family History  Family history unknown: Yes    Social History   Socioeconomic History  . Marital status: Single    Spouse name: Not on file  . Number of children: 0  . Years of education: Not on file  . Highest education level: Not on file   Occupational History  . Occupation: disabled  Tobacco Use  . Smoking status: Never Smoker  . Smokeless tobacco: Never Used  Substance and Sexual Activity  . Alcohol use: No  . Drug use: No  . Sexual activity: Never  Other Topics Concern  . Not on file  Social History Narrative  . Not on file   Social Determinants of Health   Financial Resource Strain:   . Difficulty of Paying Living Expenses:   Food Insecurity:   . Worried About Charity fundraiser in the Last Year:   . Arboriculturist in the Last Year:   Transportation Needs:   . Film/video editor (Medical):   Marland Kitchen Lack of Transportation (Non-Medical):   Physical Activity:   . Days of Exercise per Week:   . Minutes of Exercise per Session:   Stress:   . Feeling of Stress :   Social Connections:   . Frequency of Communication with Friends and Family:   . Frequency of Social Gatherings with Friends and Family:   . Attends Religious Services:   . Active Member of Clubs or Organizations:   . Attends Archivist Meetings:   Marland Kitchen Marital Status:   Intimate Partner Violence:   . Fear of Current or Ex-Partner:   . Emotionally Abused:   Marland Kitchen Physically Abused:   . Sexually Abused:      Physical Exam: BP 102/70 (BP Location: Left Arm, Patient Position: Sitting, Cuff Size: Normal)   Pulse (!) 52   Temp 98.3 F (36.8 C)  Constitutional: Sitting in wheelchair, noncommunicative, has Down syndrome features facially Psychiatric: alert and oriented x0 Eyes: extraocular movements intact Mouth: oral pharynx moist, no lesions Neck: supple no lymphadenopathy Cardiovascular: heart regular rate and rhythm Lungs: clear to auscultation bilaterally Abdomen: soft, nontender, nondistended, no obvious ascites, no peritoneal signs, normal bowel sounds Extremities: no lower extremity edema bilaterally Skin: no lesions on visible extremities   Assessment and plan: 64 y.o. male with Down syndrome, noncommunicative, sitting in  wheelchair, recently discovered cirrhosis by imaging  He is here with a caregiver today from his facility.  She tells me he has family but they are not involved with his healthcare.  She believes a Education officer, museum signed consent for any medical decision making matters.  He is noncommunicative.  He does not have edema and has not had any obvious abdominal pain or distress.  Without proper informed consent I cannot legally or in good conscience order any testing on him.  I am happy to see him back here in the office if someone could come with him who can give informed consent, such as his Education officer, museum or medical power of attorney.  Please see the "Patient Instructions" section for addition details about  the plan.   Rob Bunting, MD [facility] 07/23/2019, 9:41 AM  Total time on date of encounter was 30  minutes (this included time spent preparing to see the patient reviewing records; obtaining and/or reviewing separately obtained history; performing a medically appropriate exam and/or evaluation; counseling and educating the patient and family if present; ordering medications, tests or procedures if applicable; and documenting clinical information in the health record).

## 2019-09-01 ENCOUNTER — Other Ambulatory Visit: Payer: Self-pay

## 2019-09-01 DIAGNOSIS — K7460 Unspecified cirrhosis of liver: Secondary | ICD-10-CM | POA: Insufficient documentation

## 2019-09-01 DIAGNOSIS — D649 Anemia, unspecified: Secondary | ICD-10-CM | POA: Insufficient documentation

## 2019-09-02 ENCOUNTER — Ambulatory Visit: Payer: MEDICARE | Admitting: Gastroenterology

## 2019-09-09 ENCOUNTER — Ambulatory Visit (INDEPENDENT_AMBULATORY_CARE_PROVIDER_SITE_OTHER): Payer: MEDICARE | Admitting: Gastroenterology

## 2019-09-09 ENCOUNTER — Encounter (INDEPENDENT_AMBULATORY_CARE_PROVIDER_SITE_OTHER): Payer: Self-pay

## 2019-09-09 ENCOUNTER — Encounter: Payer: Self-pay | Admitting: Gastroenterology

## 2019-09-09 VITALS — BP 104/60 | HR 56

## 2019-09-09 DIAGNOSIS — K7460 Unspecified cirrhosis of liver: Secondary | ICD-10-CM | POA: Diagnosis not present

## 2019-09-09 NOTE — Patient Instructions (Signed)
Patient's caregiver given copy of Dr. Larae Grooms note

## 2019-09-09 NOTE — Progress Notes (Signed)
HPI: This is a 64 year old man with Down syndrome who is here with the managers for facility who seems to be his caregiver.  No one else is present with him.  No family members, no power of attorney.  No one who can legally signed for consent test or consent to treat.  Patient is noncommunicative.  CT scan abdomen pelvis with IV and oral contrast February 2021 shows a "large hiatal hernia, cirrhosis and scoliosis."  Blood work February 2021 shows platelets 120, hemoglobin 12, white blood cell count 2.7, sodium was recently in the 150s, albumin 3.0, otherwise LFTs were all normal   ROS: complete GI ROS as described in HPI, all other review negative.  Constitutional:  No unintentional weight loss   Past Medical History:  Diagnosis Date  . Anemia   . Arthritis   . Down syndrome   . Frequent falls   . Hypertension    now with hypotension  . Hypothyroidism   . MR (mental retardation)   . Myopia with astigmatism   . Osteoporosis   . Seizure disorder [facility])     Past Surgical History:  Procedure Laterality Date  . CLOSED REDUCTION NASAL FRACTURE N/A 02/18/2019   Procedure: CLOSED REDUCTION NASAL FRACTURE;  Surgeon: Irene Limbo, MD;  Location: [place];  Service: Plastics;  Laterality: N/A;    Current Outpatient Medications  Medication Sig Dispense Refill  . alendronate (FOSAMAX) 70 MG tablet Take 70 mg by mouth every Friday. Take with a full glass of water on an empty stomach.    Marland Kitchen aspirin EC 81 MG EC tablet Take 1 tablet (81 mg total) by mouth daily.    . Calcium Carbonate-Vitamin D 600-400 MG-UNIT tablet Take 1 tablet by mouth 2 (two) times daily.    . carbamazepine (TEGRETOL) 100 MG chewable tablet Chew 200 mg by mouth 2 (two) times daily.     . cetaphil (CETAPHIL) lotion Apply 1 application topically daily. Apply topically to face    . Cholecalciferol (VITAMIN D) 50 MCG (2000 UT) tablet Take 2,000 Units by mouth daily.    Marland Kitchen esomeprazole (NEXIUM) 40 MG capsule Take 40 mg by  mouth at bedtime.    . feeding supplement, ENSURE ENLIVE, (ENSURE ENLIVE) LIQD Take 237 mLs by mouth 2 (two) times daily between meals. 237 mL 12  . Glycerin-Hypromellose-PEG 400 (VISINE TEARS) 0.2-0.36-1 % SOLN Place 1 drop into both eyes 3 (three) times daily.    Marland Kitchen ketoconazole (NIZORAL) 2 % shampoo Apply 1 application topically every Monday, Wednesday, and Friday.     . lactulose (CHRONULAC) 10 GM/15ML solution Take 13.3 g by mouth daily.     Marland Kitchen levothyroxine (SYNTHROID) 50 MCG tablet Take 50 mcg by mouth daily before breakfast.     . LINZESS 145 MCG CAPS capsule Take 145 mcg by mouth daily.    Marland Kitchen lubiprostone (AMITIZA) 24 MCG capsule Take 24 mcg by mouth daily with breakfast.     . metroNIDAZOLE (METROGEL) 1 % gel Apply 1 application topically every evening. Apply to face    . mometasone (ELOCON) 0.1 % cream Apply 1 application topically See admin instructions. Apply sparingly to nasal bridge every other day    . Multiple Vitamin (MULTIVITAMIN WITH MINERALS) TABS tablet Take 1 tablet by mouth daily.    Marland Kitchen Soap & Cleansers (PHISODERM NORMAL TO OILY EX) Apply 1 application topically daily. To face     No current facility-administered medications for this visit.    Allergies as of 09/09/2019 - Review  Complete 09/09/2019  Allergen Reaction Noted  . Carbapenems Other (See Comments) 04/09/2011  . Cephalosporins Other (See Comments)   . Other Other (See Comments) 02/14/2014  . Penicillamine Other (See Comments) 02/14/2014  . Penicillins Other (See Comments) 04/09/2011    Family History  Family history unknown: Yes    Social History   Socioeconomic History  . Marital status: Single    Spouse name: Not on file  . Number of children: 0  . Years of education: Not on file  . Highest education level: Not on file  Occupational History  . Occupation: disabled  Tobacco Use  . Smoking status: Never Smoker  . Smokeless tobacco: Never Used  Substance and Sexual Activity  . Alcohol use: No   . Drug use: No  . Sexual activity: Never  Other Topics Concern  . Not on file  Social History Narrative  . Not on file   Social Determinants of Health   Financial Resource Strain:   . Difficulty of Paying Living Expenses:   Food Insecurity:   . Worried About Programme researcher, broadcasting/film/video in the Last Year:   . Barista in the Last Year:   Transportation Needs:   . Freight forwarder (Medical):   Marland Kitchen Lack of Transportation (Non-Medical):   Physical Activity:   . Days of Exercise per Week:   . Minutes of Exercise per Session:   Stress:   . Feeling of Stress :   Social Connections:   . Frequency of Communication with Friends and Family:   . Frequency of Social Gatherings with Friends and Family:   . Attends Religious Services:   . Active Member of Clubs or Organizations:   . Attends Banker Meetings:   Marland Kitchen Marital Status:   Intimate Partner Violence:   . Fear of Current or Ex-Partner:   . Emotionally Abused:   Marland Kitchen Physically Abused:   . Sexually Abused:      Physical Exam: Constitutional: Down syndrome features Psychiatric: Alert and oriented x0 Abdomen: soft, nontender, nondistended, no obvious ascites, no peritoneal signs, normal bowel sounds No peripheral edema noted in lower extremities  Assessment and plan: 64 y.o. male with Down syndrome, sitting in a wheelchair, recently discovered cirrhosis  Travis Hanna is here 2-1/2 months ago.  He was present with a caregiver from his facility.  He was noncommunicative sitting in a wheelchair.  We have limited records but enough to show that he was recently diagnosed with cirrhosis.  He had no obvious edema, abdominal pain and he was clearly not in any distress physically.  I explained to him and his caregiver that time that without proper informed consent I cannot legally or in good conscience order any testing for him.  At that time I explained also that I was very happy to see him back in the office with the power of  attorney present or someone who could sign legally for medical consent to procedures and testing.  This visit was set up.  We were under the assumption that his power of attorney would be here at the visit with him.  Instead the manager of his facility came with him as a caregiver.  She told me she has not been able to reach his only family member who is his brother.  She has not been able to speak with him about today's plan visit.  Again, I am happy to help care for Mr. Chamberlin but without someone present or at least someone  on the phone who can give legal medical consent I cannot legally or in good conscience order any tests or procedures for him.  Please see the "Patient Instructions" section for addition details about the plan.  Rob Bunting, MD [facility] 09/09/2019, 3:59 PM   Total time on date of encounter was 30 minutes (this included time spent preparing to see the patient reviewing records; obtaining and/or reviewing separately obtained history; performing a medically appropriate exam and/or evaluation; counseling and educating the patient and family if present; ordering medications, tests or procedures if applicable; and documenting clinical information in the health record).

## 2020-04-12 ENCOUNTER — Ambulatory Visit: Payer: MEDICARE | Admitting: Physician Assistant

## 2020-05-05 ENCOUNTER — Telehealth: Payer: Self-pay

## 2020-05-05 NOTE — Telephone Encounter (Signed)
Error

## 2020-05-11 ENCOUNTER — Ambulatory Visit: Payer: MEDICARE | Admitting: Nurse Practitioner

## 2020-05-11 NOTE — Progress Notes (Deleted)
05/11/2020 Travis Hanna [id number] 09/06/1955   Chief Complaint:  History of Present Illness: Travis Hanna is a 65 year old male with a past medical history of hypertension, MR, arthritis, scoliosis,  Down syndrome, seizure disorder, osteoporosis, anemia, large hiatal hernia and cirrhosis.  He was admitted to the hospital 06/11/2019 after he fell which resulted in a bloody nose and during this work-up he was found to have hypernatremia.  An abdominal/pelvic CT scan showed evidence of cirrhosis.  He was discharged home on 06/16/2019 with the recommendations to schedule a GI consult for further cirrhosis evaluation. He was initially seen in our office by Dr. Christella Hartigan on 07/23/2019 for further evaluation for cirrhosis which was diagnosed during his hospitalization from hematuria 06/2019. At that time, the patient presented with his caregiver from his group home, no family members or power of attorney were present. Dr. Christella Hartigan requested for the patient to reschedule an office follow up appointment and to have his power of attorney or family member present  in order to appropriately proceed with any further testing and cirrhosis evaluation.  The patient was rescheduled for an office appointment on 09/09/2019 and the patient once again presented with a manager from his long-term care facility without his power of attorney or family member.      The patient is noncommunicative.  CT head negative on admission, however repeat CT scan 4/19 showed a "subtle hypodensity LEFT periventricular white matter could represent developing nonhemorrhagic infarct". Neurology consulted and have followed patient while hospitalized. Unfortunately patient not cooperating with most of the testing and MRI could not be obtained but he underwent complete stroke workup with 2D echo, which showed normal EF, carotid duplex which showed 1-39% ICA plaquing. Vertebral artery flow is antegrade. His A1C was 5.0. His LDL was 104. He was  started on Aspirin and statin  CBC Latest Ref Rng & Units 06/14/2019 06/12/2019 06/11/2019  WBC 4.0 - 10.5 K/uL 2.7(L) 4.0 5.7  Hemoglobin 13.0 - 17.0 g/dL 12.0(L) 12.4(L) 13.4  Hematocrit 39.0 - 52.0 % 36.7(L) 40.3 42.5  Platelets 150 - 400 K/uL 120(L) 133(L) 137(L)   CMP Latest Ref Rng & Units 06/16/2019 06/15/2019 06/14/2019  Glucose 70 - 99 mg/dL 88 70 086(V)  BUN 8 - 23 mg/dL 8 11 7(L)  Creatinine 7.84 - 1.24 mg/dL 6.96 2.95 2.84  Sodium 135 - 145 mmol/L 139 141 139  Potassium 3.5 - 5.1 mmol/L 3.7 4.3 3.4(L)  Chloride 98 - 111 mmol/L 102 105 105  CO2 22 - 32 mmol/L 30 25 27   Calcium 8.9 - 10.3 mg/dL 8.1(L) 7.9(L) 7.9(L)  Total Protein 6.5 - 8.1 g/dL - - -  Total Bilirubin 0.3 - 1.2 mg/dL - - -  Alkaline Phos 38 - 126 U/L - - -  AST 15 - 41 U/L - - -  ALT 0 - 44 U/L - - -    Abdominal/pelvic CT with contrast 06/11/2019: 1. Large hiatal hernia. 2. Cirrhosis. 3. Marked severity levoscoliosis of the upper lumbar spine with multilevel degenerative changes  ECHO 08/24/2016: - Left ventricle: The cavity size was normal. Systolic function was  normal. The estimated ejection fraction was in the range of 60%  to 65%. Although no diagnostic regional wall motion abnormality  was identified, this possibility cannot be completely excluded on  the basis of this study.  - Tricuspid valve: There was mild-moderate regurgitation.  - Pericardium, extracardiac: A small pericardial effusion was  identified circumferential to the heart.  - No cardiac  source of embolism was identified, but cannot be ruled  out on the basis of this examination.   Current Medications, Allergies, Past Medical History, Past Surgical History, Family History and Social History were reviewed in [employer] record.   Review of Systems:   Constitutional: Negative for fever, sweats, chills or weight loss.  Respiratory: Negative for shortness of breath.   Cardiovascular: Negative for chest  pain, palpitations and leg swelling.  Gastrointestinal: See HPI.  Musculoskeletal: Negative for back pain or muscle aches.  Neurological: Negative for dizziness, headaches or paresthesias.    Physical Exam: There were no vitals taken for this visit. General: Well developed, w   ***male in no acute distress. Head: Normocephalic and atraumatic. Eyes: No scleral icterus. Conjunctiva pink . Ears: Normal auditory acuity. Mouth: Dentition intact. No ulcers or lesions.  Lungs: Clear throughout to auscultation. Heart: Regular rate and rhythm, no murmur. Abdomen: Soft, nontender and nondistended. No masses or hepatomegaly. Normal bowel sounds x 4 quadrants.  Rectal: *** Musculoskeletal: Symmetrical with no gross deformities. Extremities: No edema. Neurological: Alert oriented x 4. No focal deficits.  Psychological: Alert and cooperative. Normal mood and affect  Assessment and Recommendations: ***

## 2021-05-05 ENCOUNTER — Emergency Department (HOSPITAL_COMMUNITY)
Admission: EM | Admit: 2021-05-05 | Discharge: 2021-05-05 | Disposition: A | Discharge status: Home / Self Care | Payer: MEDICARE | Attending: Emergency Medicine | Admitting: Emergency Medicine

## 2021-05-05 ENCOUNTER — Encounter (HOSPITAL_COMMUNITY): Payer: Self-pay

## 2021-05-05 ENCOUNTER — Emergency Department (HOSPITAL_COMMUNITY): Payer: MEDICARE

## 2021-05-05 DIAGNOSIS — S01511A Laceration without foreign body of lip, initial encounter: Secondary | ICD-10-CM | POA: Insufficient documentation

## 2021-05-05 DIAGNOSIS — Z79899 Other long term (current) drug therapy: Secondary | ICD-10-CM | POA: Diagnosis not present

## 2021-05-05 DIAGNOSIS — E039 Hypothyroidism, unspecified: Secondary | ICD-10-CM | POA: Diagnosis not present

## 2021-05-05 DIAGNOSIS — W19XXXA Unspecified fall, initial encounter: Secondary | ICD-10-CM

## 2021-05-05 DIAGNOSIS — W1839XA Other fall on same level, initial encounter: Secondary | ICD-10-CM | POA: Diagnosis not present

## 2021-05-05 DIAGNOSIS — I10 Essential (primary) hypertension: Secondary | ICD-10-CM | POA: Diagnosis not present

## 2021-05-05 DIAGNOSIS — Z7982 Long term (current) use of aspirin: Secondary | ICD-10-CM | POA: Insufficient documentation

## 2021-05-05 DIAGNOSIS — S0993XA Unspecified injury of face, initial encounter: Secondary | ICD-10-CM | POA: Diagnosis present

## 2021-05-05 DIAGNOSIS — S0990XA Unspecified injury of head, initial encounter: Secondary | ICD-10-CM | POA: Insufficient documentation

## 2021-05-05 DIAGNOSIS — Z8673 Personal history of transient ischemic attack (TIA), and cerebral infarction without residual deficits: Secondary | ICD-10-CM | POA: Diagnosis not present

## 2021-05-05 DIAGNOSIS — S025XXA Fracture of tooth (traumatic), initial encounter for closed fracture: Secondary | ICD-10-CM | POA: Insufficient documentation

## 2021-05-05 MED ORDER — ACETAMINOPHEN 160 MG/5ML PO SOLN
650.0000 mg | Freq: Once | ORAL | Status: DC
  Filled 2021-05-05: qty 20.3

## 2021-05-05 MED ORDER — ACETAMINOPHEN 160 MG/5ML PO LIQD: 500.0000 mg | Freq: Four times a day (QID) | ORAL | 0 refills | Status: DC | PRN

## 2021-05-05 MED ORDER — IBUPROFEN 100 MG/5ML PO SUSP: 200.0000 mg | Freq: Three times a day (TID) | ORAL | 0 refills | Status: AC | PRN

## 2021-05-05 NOTE — ED Notes (Signed)
Report called to [facility] and given to Caregiver at bedside.

## 2021-05-05 NOTE — ED Provider Notes (Signed)
[facility] EMERGENCY DEPARTMENT Provider Note   CSN: [id number] Arrival date & time: 05/05/21  1124     History Chief Complaint  Patient presents with   Travis Hanna is a 65 y.o. male with history of Down syndrome presenting to the ED with a witnessed fall at his care facility, fall from sitting onto the front of his face.  There was concern that he bit through his lip.  No loss of consciousness.  The patient is nonverbal at baseline.  He is not on blood thinners.  He is here with a facility representative at the bedside, [facility]  HPI     Past Medical History:  Diagnosis Date   Anemia    Arthritis    Down syndrome    Frequent falls    Hypertension    now with hypotension   Hypothyroidism    MR (mental retardation)    Myopia with astigmatism    Osteoporosis    Seizure disorder [facility])     Patient Active Problem List   Diagnosis Date Noted   Anemia 09/01/2019   Cirrhosis (HCC) 09/01/2019   Hypernatremia 06/11/2019   Nonspecific abnormal electrocardiogram (ECG) (EKG) 01/23/2019   Frequent falls 01/23/2019   Hypertension 01/23/2019   Bradycardia 01/23/2019   CVA (cerebral vascular accident) (HCC) 08/25/2016   Right leg weakness 08/23/2016   Hypothyroidism 06/26/2013   Ileus (HCC) 06/25/2013   Abdominal pain, unspecified site 06/25/2013   Down syndrome, unspecified 06/25/2013   Constipation 06/25/2013    Past Surgical History:  Procedure Laterality Date   CLOSED REDUCTION NASAL FRACTURE N/A 02/18/2019   Procedure: CLOSED REDUCTION NASAL FRACTURE;  Surgeon: Glenna Fellows, MD;  Location: [facility] OR;  Service: Plastics;  Laterality: N/A;       Family History  Family history unknown: Yes    Social History   Tobacco Use   Smoking status: Never   Smokeless tobacco: Never  Vaping Use   Vaping Use: Never used  Substance Use Topics   Alcohol use: No   Drug use: No    Home Medications Prior to Admission medications    Medication Sig Start Date End Date Taking? Authorizing Provider  acetaminophen (TYLENOL) 160 MG/5ML liquid Take 15.6 mLs (500 mg total) by mouth every 6 (six) hours as needed for pain. 05/05/21  Yes Terald Sleeper, MD  ibuprofen (ADVIL) 100 MG/5ML suspension Take 10 mLs (200 mg total) by mouth every 8 (eight) hours as needed for mild pain or moderate pain. 05/05/21 06/04/21 Yes Terald Sleeper, MD  alendronate (FOSAMAX) 70 MG tablet Take 70 mg by mouth every Friday. Take with a full glass of water on an empty stomach.    [provider]  aspirin EC 81 MG EC tablet Take 1 tablet (81 mg total) by mouth daily. 08/26/16   Leatha Gilding, MD  Calcium Carbonate-Vitamin D 600-400 MG-UNIT tablet Take 1 tablet by mouth 2 (two) times daily.    [provider]  carbamazepine (TEGRETOL) 100 MG chewable tablet Chew 200 mg by mouth 2 (two) times daily.  01/07/19   [provider]  cetaphil (CETAPHIL) lotion Apply 1 application topically daily. Apply topically to face    [provider]  Cholecalciferol (VITAMIN D) 50 MCG (2000 UT) tablet Take 2,000 Units by mouth daily.    [provider]  esomeprazole (NEXIUM) 40 MG capsule Take 40 mg by mouth at bedtime.    [provider]  feeding  supplement, ENSURE ENLIVE, (ENSURE ENLIVE) LIQD Take 237 mLs by mouth 2 (two) times daily between meals. 06/16/19   Rodolph Bong, MD  Glycerin-Hypromellose-PEG 400 (VISINE TEARS) 0.2-0.36-1 % SOLN Place 1 drop into both eyes 3 (three) times daily.    [provider]  ketoconazole (NIZORAL) 2 % shampoo Apply 1 application topically every Monday, Wednesday, and Friday.     [provider]  lactulose (CHRONULAC) 10 GM/15ML solution Take 13.3 g by mouth daily.     [provider]  levothyroxine (SYNTHROID) 50 MCG tablet Take 50 mcg by mouth daily before breakfast.     [provider]  LINZESS 145 MCG CAPS capsule Take 145 mcg by mouth daily.  07/09/19   [provider]  lubiprostone (AMITIZA) 24 MCG capsule Take 24 mcg by mouth daily with breakfast.     [provider]  metroNIDAZOLE (METROGEL) 1 % gel Apply 1 application topically every evening. Apply to face    [provider]  mometasone (ELOCON) 0.1 % cream Apply 1 application topically See admin instructions. Apply sparingly to nasal bridge every other day    [provider]  Multiple Vitamin (MULTIVITAMIN WITH MINERALS) TABS tablet Take 1 tablet by mouth daily. 06/17/19   Rodolph Bong, MD  Soap & Cleansers (PHISODERM NORMAL TO OILY EX) Apply 1 application topically daily. To face    [provider]    Allergies    Carbapenems, Cephalosporins, Other, Penicillamine, and Penicillins  Review of Systems   Review of Systems  Unable to perform ROS: Patient nonverbal (level 5 caveat)   Physical Exam Updated Vital Signs BP 95/67    Pulse (!) 48    Temp (!) 97.5 F (36.4 C)    Resp 12    SpO2 98%   Physical Exam Constitutional:      General: He is not in acute distress. HENT:     Head: Normocephalic.     Comments: Through and thorugh laceration of central lower lip with no active bleeding Small chipped front right tooth Tongue without visible lacerations or swelling Cardiovascular:     Rate and Rhythm: Normal rate and regular rhythm.  Pulmonary:     Effort: Pulmonary effort is normal. No respiratory distress.  Skin:    General: Skin is warm and dry.  Neurological:     General: No focal deficit present.     Mental Status: He is alert. Mental status is at baseline.  Psychiatric:        Mood and Affect: Mood normal.        Behavior: Behavior normal.    ED Results / Procedures / Treatments   Labs (all labs ordered are listed, but only abnormal results are displayed) Labs Reviewed - No data to display  EKG None  Radiology CT HEAD WO CONTRAST ( )  Result Date: 05/05/2021 CLINICAL DATA:  Head trauma,  moderate-severe fall frontal facial injury, fall from sitting, hx of down syndrome, poor historian EXAM: CT HEAD WITHOUT CONTRAST TECHNIQUE: Contiguous axial images were obtained from the base of the skull through the vertex without intravenous contrast. COMPARISON:  06/11/2019 FINDINGS: Brain: There is no acute intracranial hemorrhage, mass effect, or edema. Gray-white differentiation is preserved. There is no extra-axial fluid collection. Stable prominence of the ventricles and sulci reflecting parenchymal volume loss. Patchy and confluent hypoattenuation in the supratentorial white matter is nonspecific but probably reflects stable chronic microvascular ischemic changes. Small colloid cyst is not as well seen. Vascular: There is atherosclerotic  calcification at the skull base. Skull: Calvarium is unremarkable. Sinuses/Orbits: Patchy mucosal thickening. No acute orbital abnormality. Other: None. IMPRESSION: No evidence of acute intracranial injury. Stable chronic/nonemergent findings. Electronically Signed   By: Guadlupe Spanish M.D.   On: 05/05/2021 12:29    Procedures Procedures   Medications Ordered in ED Medications  acetaminophen (TYLENOL) 160 MG/5ML solution 650 mg (650 mg Oral Not Given 05/05/21 1359)    ED Course  I have reviewed the triage vital signs and the nursing notes.  Pertinent labs & imaging results that were available during my care of the patient were reviewed by me and considered in my medical decision making (see chart for details).   Through laceration of the lip, no active bleeding, I do not suspect this to be amenable to suturing this because significant distress given the patient's mental situation.  I have ordered a CT scan of the head to rule out intracranial bleed.  CTH with no acute traumatic findings  [facility] staff member at bedside states they have a dentist available to come to center to look at his teeth.  Clinical Course as of 05/05/21 1430  Thu May 05, 2021  1326 Unable to reach legal guardian or brother by phone numbers provided.  Verbal and written instructions provided to group home representative at bedside who will take the patient home. [MT]    Clinical Course User Index [MT] Renaye Rakers Kermit Balo, MD   Final Clinical Impression(s) / ED Diagnoses Final diagnoses:  Fall, initial encounter  Lip laceration, initial encounter  Closed fracture of tooth, initial encounter    Rx / DC Orders ED Discharge Orders          Ordered    ibuprofen (ADVIL) 100 MG/5ML suspension  Every 8 hours PRN        05/05/21 1332    acetaminophen (TYLENOL) 160 MG/5ML liquid  Every 6 hours PRN        05/05/21 1332             Terald Sleeper, MD 05/05/21 1430

## 2021-05-05 NOTE — ED Triage Notes (Signed)
Patient from [facility], Downs Syndrome, nonverbal at baseline. Unwitnessed fall from wheelchair face first with dried blood in mouth and laceration to inner lower lip. Staff reported that patient was acting normally when medic arrived.

## 2021-05-05 NOTE — Discharge Instructions (Addendum)
Travis Hanna's CT scan of the brain did not show brain injury or bleeding.  He bit through his lower lip.  This will heal on its own within a week.  I recommend giving liquid tylenol or motrin as needed for pain.  Consider giving him fluids through a straw.  He can eat soft foods.  He has a chipped front tooth in the middle upper mouth.  This needs the attention of a dentist.  Please consider calling a dentist for this.

## 2021-08-04 ENCOUNTER — Other Ambulatory Visit: Payer: Self-pay

## 2021-08-04 ENCOUNTER — Emergency Department (HOSPITAL_COMMUNITY)
Admission: EM | Admit: 2021-08-04 | Discharge: 2021-08-04 | Disposition: A | Discharge status: Home / Self Care | Payer: MEDICARE | Attending: Emergency Medicine | Admitting: Emergency Medicine

## 2021-08-04 ENCOUNTER — Encounter (HOSPITAL_COMMUNITY): Payer: Self-pay

## 2021-08-04 ENCOUNTER — Emergency Department (HOSPITAL_COMMUNITY): Payer: MEDICARE

## 2021-08-04 DIAGNOSIS — R001 Bradycardia, unspecified: Secondary | ICD-10-CM | POA: Insufficient documentation

## 2021-08-04 DIAGNOSIS — Z7982 Long term (current) use of aspirin: Secondary | ICD-10-CM | POA: Diagnosis not present

## 2021-08-04 DIAGNOSIS — S0990XA Unspecified injury of head, initial encounter: Secondary | ICD-10-CM | POA: Insufficient documentation

## 2021-08-04 DIAGNOSIS — S90811A Abrasion, right foot, initial encounter: Secondary | ICD-10-CM | POA: Insufficient documentation

## 2021-08-04 DIAGNOSIS — W19XXXA Unspecified fall, initial encounter: Secondary | ICD-10-CM | POA: Insufficient documentation

## 2021-08-04 DIAGNOSIS — S99921A Unspecified injury of right foot, initial encounter: Secondary | ICD-10-CM | POA: Diagnosis present

## 2021-08-04 DIAGNOSIS — I10 Essential (primary) hypertension: Secondary | ICD-10-CM | POA: Diagnosis not present

## 2021-08-04 DIAGNOSIS — R296 Repeated falls: Secondary | ICD-10-CM

## 2021-08-04 LAB — BASIC METABOLIC PANEL
Anion gap: 7 (ref 5–15)
BUN: 9 mg/dL (ref 8–23)
CO2: 28 mmol/L (ref 22–32)
Calcium: 8.5 mg/dL — ABNORMAL LOW (ref 8.9–10.3)
Chloride: 104 mmol/L (ref 98–111)
Creatinine, Ser: 0.83 mg/dL (ref 0.61–1.24)
GFR, Estimated: >60 mL/min (ref >60)
Glucose, Bld: 89 mg/dL (ref 70–99)
Potassium: 4.4 mmol/L (ref 3.5–5.1)
Sodium: 139 mmol/L (ref 135–145)

## 2021-08-04 LAB — CBC WITH DIFFERENTIAL/PLATELET
Abs Immature Granulocytes: 0 10*3/uL (ref 0.00–0.07)
Basophils Absolute: 0 10*3/uL (ref 0.0–0.1)
Basophils Relative: 1 %
Eosinophils Absolute: 0 10*3/uL (ref 0.0–0.5)
Eosinophils Relative: 1 %
HCT: 43.1 % (ref 39.0–52.0)
Hemoglobin: 14.4 g/dL (ref 13.0–17.0)
Lymphocytes Relative: 17 %
Lymphs Abs: 0.7 10*3/uL (ref 0.7–4.0)
MCH: 37.8 pg — ABNORMAL HIGH (ref 26.0–34.0)
MCHC: 33.4 g/dL (ref 30.0–36.0)
MCV: 113.1 fL — ABNORMAL HIGH (ref 80.0–100.0)
Monocytes Absolute: 0.2 10*3/uL (ref 0.1–1.0)
Monocytes Relative: 5 %
Neutro Abs: 3 10*3/uL (ref 1.7–7.7)
Neutrophils Relative %: 76 %
Platelets: 207 10*3/uL (ref 150–400)
RBC: 3.81 MIL/uL — ABNORMAL LOW (ref 4.22–5.81)
RDW: 13 % (ref 11.5–15.5)
WBC: 4 10*3/uL (ref 4.0–10.5)
nRBC: 0 % (ref 0.0–0.2)
nRBC: 0 /100 WBC

## 2021-08-04 NOTE — Discharge Instructions (Addendum)
Follow-up with your primary care doctor or cardiologist to discuss the fall, low heart rate.  If he has additional falls, any episodes of passing out or other new concerning symptom, come back to ER for reassessment. ?

## 2021-08-04 NOTE — ED Notes (Signed)
Patient transported to CT 

## 2021-08-04 NOTE — ED Provider Notes (Signed)
?[name] ?Provider Note ? ? ?CSN: [id number] ?Arrival date & time: 08/04/21  0904 ? ?  ? ?History ? ?Chief Complaint  ?Patient presents with  ? possible fall  ? ? ?Travis Hanna is a 66 y.o. male.  Presenting to ER with concern for possible fall.  Patient is from facility, baseline nonverbal. ? ?History obtained from EMS report -nursing staff noticed ecchymosis to left eye as well as some swelling in possible bleeding from his right foot.  Patient was laying in bed, unsure if he had a fall. ? 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 ?  ? ?Home Medications ?Prior to Admission medications   ?Medication Sig Start Date End Date Taking? Authorizing Provider  ?acetaminophen (TYLENOL) 160 MG/5ML liquid Take 15.6 mLs (500 mg total) by mouth every 6 (six) hours as needed for pain. ?Patient taking differently: Take 640 mg by mouth every 4 (four) hours as needed for pain or fever. 05/05/21  Yes Wyvonnia Dusky, MD  ?acetaminophen (TYLENOL) 325 MG tablet Take 650 mg by mouth every 4 (four) hours as needed for fever or mild pain.   Yes [provider]  ?alendronate (FOSAMAX) 70 MG tablet Take 70 mg by mouth every Friday. Take with a full glass of water on an empty stomach.   Yes [provider]  ?alum & mag hydroxide-simeth (MAALOX PLUS) 400-400-40 MG/5ML suspension Take 15-30 mLs by mouth See admin instructions. 5ml (if patient <100lbs) or 68ml (if pt is >100lbs) every 2 hours as needed for indigestion/heartburn/upset stomach   Yes [provider]  ?aspirin EC 81 MG EC tablet Take 1 tablet (81 mg total) by mouth daily. 08/26/16  Yes Caren Griffins, MD  ?bisacodyl (DULCOLAX) 10 MG suppository Place 10 mg rectally daily as needed (constipation if no result from MOM).   Yes [provider]  ?Brompheniramine-Pseudoeph (DIMETAPP PO) Take 10-20 mLs by mouth See admin instructions. 33ml (if <100lbs) to 46ml (if >100lbs) every 4 hours as needed for cold symptoms   Yes [provider]  ?Calcium Carbonate-Vitamin D 600-400 MG-UNIT tablet Take 1 tablet by mouth 2 (two) times daily.   Yes [provider]  ?carbamazepine (TEGRETOL) 100 MG chewable tablet Chew 200 mg by mouth 2 (two) times daily.  01/07/19  Yes [provider]  ?carbamide peroxide (DEBROX) 6.5 % OTIC solution Place 5 drops into both ears 2 (two) times daily as needed (ear wax removal).   Yes [provider]  ?cetaphil (CETAPHIL) lotion Apply 1 application topically daily. Apply topically to face   Yes [provider]  ?Cholecalciferol (VITAMIN D) 50 MCG (2000 UT) tablet Take 2,000 Units by mouth daily.   Yes [provider]  ?Diethyltoluamide (OFF ACTIVE) 15 % AERO Apply 1 application. topically daily as needed (insect bite prevention).   Yes [provider]  ?diphenhydrAMINE (BENADRYL) 12.5 MG/5ML liquid Take 25 mg by mouth 4 (four) times daily as needed for allergies or itching (nasal congestion).   Yes [provider]  ?diphenhydrAMINE (BENADRYL) 25 MG tablet Take 25 mg by mouth every 4 (four) hours as needed for allergies or itching (nasal congestion).   Yes [provider]  ?esomeprazole (NEXIUM) 40 MG capsule Take 40 mg by mouth at bedtime.   Yes [provider]  ?feeding  supplement, ENSURE ENLIVE, (ENSURE ENLIVE) LIQD Take 237 mLs by mouth 2 (two) times daily between meals. ?Patient taking differently: Take 237 mLs by mouth in the morning and at bedtime. 06/16/19  Yes Eugenie Filler, MD  ?Glycerin-Hypromellose-PEG 400 (VISINE TEARS) 0.2-0.36-1 % SOLN Place 1 drop into both eyes 3 (three) times daily.   Yes [provider]  ?guaiFENesin (ROBITUSSIN) 100 MG/5ML liquid Take 300 mg by mouth every 4 (four) hours as needed for  cough.   Yes [provider]  ?hydrocortisone cream 1 % Apply 1 application. topically 2 (two) times daily as needed for itching (bug bites).   Yes [provider]  ?ibuprofen (ADVIL) 200 MG tablet Take 200-600 mg by mouth every 8 (eight) hours as needed for mild pain (discomfort).   Yes [provider]  ?ketoconazole (NIZORAL) 2 % shampoo Apply 1 application topically every Monday, Wednesday, and Friday.    Yes [provider]  ?lactulose (CHRONULAC) 10 GM/15ML solution Take 13.3 g by mouth daily.    Yes [provider]  ?levothyroxine (SYNTHROID) 50 MCG tablet Take 50 mcg by mouth daily.   Yes [provider]  ?LINZESS 145 MCG CAPS capsule Take 145 mcg by mouth daily. 07/09/19  Yes [provider]  ?loperamide (IMODIUM A-D) 2 MG tablet Take 2-4 mg by mouth See admin instructions. Take 4mg  by mouth for initial dose, then take 2mg  for each loose stool afterwards.   Yes [provider]  ?metroNIDAZOLE (METROGEL) 1 % gel Apply 1 application topically every evening. Apply to face   Yes [provider]  ?neomycin-bacitracin-polymyxin (NEOSPORIN) [phone] ointment Apply 1 application. topically 2 (two) times daily as needed (abrasions and superficial lacerations).   Yes [provider]  ?promethazine (PHENERGAN) 25 MG tablet Take 25 mg by mouth See admin instructions. 25mg  every 4 to 6 hours as needed for vomiting   Yes [provider]  ?Skin Protectants, Misc. (CHAPSTICK) Boyd Apply 1 each topically daily as needed (to prevent sunburn).   Yes [provider]  ?Soap & Cleansers (PHISODERM NORMAL TO OILY EX) Apply 1 application topically daily. To face   Yes [provider]  ?Sunscreens (SUNSCREEN ULTRA SHEER EX) Apply 1 application. topically daily as needed (prevent sunburn).   Yes [provider]  ?Vitamins A & D (VITAMIN A & D) ointment Apply 1 application. topically 3 (three) times daily as  needed (skin irritation/rash/chapped skin).   Yes [provider]  ?zinc oxide (BALMEX) 11.3 % CREA cream Apply 1 application. topically 4 (four) times daily as needed (skin irritation/rash/chapped skin).   Yes [provider]  ?   ? ?Allergies    ?Carbapenems, Cephalosporins, Other, Penicillamine, and Penicillins   ? ?Review of Systems   ?Review of Systems  ?Unable to perform ROS: Patient nonverbal  ? ?Physical Exam ?Updated Vital Signs ?BP (!) 114/96 (BP Location: Right Arm)   Pulse (!) 46   Temp 97.8 ?F (36.6 ?C) (Axillary)   Resp 14   Ht 5' (1.524 m)   Wt 45.7 kg   SpO2 100%   BMI 19.68 kg/m?  ?Physical Exam ?Vitals and nursing note reviewed.  ?Constitutional:   ?   General: He is not in acute distress. ?   Appearance: He is well-developed.  ?HENT:  ?   Head: Normocephalic.  ?   Comments: Mild ecchymosis under the left orbit, eye appears normal ?Eyes:  ?   Conjunctiva/sclera: Conjunctivae normal.  ?Cardiovascular:  ?  Rate and Rhythm: Normal rate and regular rhythm.  ?   Heart sounds: No murmur heard. ?Pulmonary:  ?   Effort: Pulmonary effort is normal. No respiratory distress.  ?   Breath sounds: Normal breath sounds.  ?Abdominal:  ?   Palpations: Abdomen is soft.  ?   Tenderness: There is no abdominal tenderness.  ?Musculoskeletal:     ?   General: No swelling.  ?   Cervical back: Neck supple.  ?   Comments: Back: no C, T, L spine TTP, no step off or deformity ?RUE: Abrasion to anterior foot but no significant deformity, normal joint ROM, radial pulse intact, distal sensation and motor intact ?LUE: no TTP throughout, no deformity, normal joint ROM, radial pulse intact, distal sensation and motor intact ?RLE:  no TTP throughout, no deformity, normal joint ROM, distal pulse, sensation and motor intact ?LLE: no TTP throughout, no deformity, normal joint ROM, distal pulse, sensation and motor intact  ?Skin: ?   General: Skin is warm and dry.  ?   Capillary Refill: Capillary refill takes  less than 2 seconds.  ?Neurological:  ?   Mental Status: He is alert.  ?   Comments: Alert, nonverbal  ?Psychiatric:     ?   Mood and Affect: Mood normal.  ? ? ?ED Results / Procedures / Treatments   ?Labs ?(all labs ord

## 2021-08-04 NOTE — ED Notes (Signed)
RN reviewed discharge instructions w/ pt's caregiver. Pt caregiver had no further questions ?

## 2021-08-04 NOTE — ED Notes (Signed)
RN called [facility] to give report, left a voicemail on nurses line. Pt's caregiver at bedside aware of results and discharge ?

## 2021-08-04 NOTE — ED Triage Notes (Signed)
Pt arrived via [facility] from [facility] nursing home. Per EMS, at shift change the morning nurses noticed pt had an ecchymotic left eye, scab on back, anterior of right foot swollen. Pt was found in bed. Pt possibly fell, but pt does not talk much and is severe MR. ?

## 2021-08-04 NOTE — ED Notes (Signed)
Pt's left eye is ecchymotic and has 2+ swelling of lower eye lid. There was not anything on pt's back. Pt has trace selling of anterior of right foot, 2+ right pedal pulse, warm to touch, pt able to wiggle toes. Pt has a small superficial abrasion on anterior of right foot. ?

## 2021-09-26 ENCOUNTER — Ambulatory Visit (INDEPENDENT_AMBULATORY_CARE_PROVIDER_SITE_OTHER): Payer: MEDICARE | Admitting: Podiatry

## 2021-09-26 DIAGNOSIS — B351 Tinea unguium: Secondary | ICD-10-CM | POA: Diagnosis not present

## 2021-09-26 DIAGNOSIS — M79675 Pain in left toe(s): Secondary | ICD-10-CM

## 2021-09-26 DIAGNOSIS — M79674 Pain in right toe(s): Secondary | ICD-10-CM | POA: Diagnosis not present

## 2021-09-26 NOTE — Progress Notes (Signed)
   SUBJECTIVE Nonverbal patient PMHx Down syndrome presents to office today with his caregiver from  [facility] complaining of elongated, thickened nails that cause pain while ambulating in shoes.  Patient is unable to trim their own nails. Patient is here for further evaluation and treatment.  Past Medical History:  Diagnosis Date   Anemia    Arthritis    Down syndrome    Frequent falls    Hypertension    now with hypotension   Hypothyroidism    MR (mental retardation)    Myopia with astigmatism    Osteoporosis    Seizure disorder [facility])    Past Surgical History:  Procedure Laterality Date   CLOSED REDUCTION NASAL FRACTURE N/A 02/18/2019   Procedure: CLOSED REDUCTION NASAL FRACTURE;  Surgeon: Glenna Fellows, MD;  Location: [facility] OR;  Service: Plastics;  Laterality: N/A;   Allergies  Allergen Reactions   Carbapenems Other (See Comments)    unknown   Cephalosporins Other (See Comments)    unknown   Other Other (See Comments)    Allergic to Betalactams per group home-unknown reaction    Penicillamine Other (See Comments)    unknown   Penicillins Other (See Comments)    unknown    OBJECTIVE General Patient is awake, alert, and oriented x 3 and in no acute distress. Derm Skin is dry and supple bilateral. Negative open lesions or macerations. Remaining integument unremarkable. Nails are tender, long, thickened and dystrophic with subungual debris, consistent with onychomycosis, 1-5 bilateral. No signs of infection noted. Vasc  DP and PT pedal pulses palpable bilaterally. Temperature gradient within normal limits.  Neuro Epicritic and protective threshold sensation grossly intact bilaterally.  Musculoskeletal Exam No symptomatic pedal deformities noted bilateral. Muscular strength within normal limits.  ASSESSMENT 1.  Pain due to onychomycosis of toenails both  PLAN OF CARE 1. Patient evaluated today.  2. Instructed to maintain good pedal hygiene and foot care.   3. Mechanical debridement of nails 1-5 bilaterally performed using a nail nipper. Filed with dremel without incident.  4. Return to clinic in 3 mos.    Felecia Shelling, DPM [facility]  Dr. Felecia Shelling, DPM    [address] [place], [place] [address]                Office [phone]  Fax [phone]

## 2021-10-20 ENCOUNTER — Other Ambulatory Visit: Payer: Self-pay | Admitting: Family Medicine

## 2021-10-20 ENCOUNTER — Ambulatory Visit
Admission: RE | Admit: 2021-10-20 | Discharge: 2021-10-20 | Disposition: A | Discharge status: Home / Self Care | Payer: MEDICARE | Source: Ambulatory Visit | Attending: Family Medicine | Admitting: Family Medicine

## 2021-10-20 DIAGNOSIS — R0989 Other specified symptoms and signs involving the circulatory and respiratory systems: Secondary | ICD-10-CM

## 2022-01-30 ENCOUNTER — Ambulatory Visit
Admission: RE | Admit: 2022-01-30 | Discharge: 2022-01-30 | Disposition: A | Discharge status: Home / Self Care | Payer: MEDICARE | Source: Ambulatory Visit | Attending: Family Medicine | Admitting: Family Medicine

## 2022-01-30 ENCOUNTER — Other Ambulatory Visit: Payer: Self-pay | Admitting: Family Medicine

## 2022-01-30 DIAGNOSIS — W19XXXA Unspecified fall, initial encounter: Secondary | ICD-10-CM

## 2022-05-16 IMAGING — CT CT HEAD W/O CM
4 series · 15 of 47 positions shown, 17 images · non-contrast
Comparison: CT cervical spine October 14, 2018. CT head 12/21/2018. CT
maxillofacial 02/05/2019.

CLINICAL DATA: Head trauma, moderate-severe; Facial trauma, blunt;
Neck trauma, dangerous injury mechanism (Age 16-64y)



[Series 3: head without · axial · non-contrast · 0.37mm/px · z∈[+874,+989]mm · 7 of 31 slices shown, 9 images]
[im 4/31  brain]
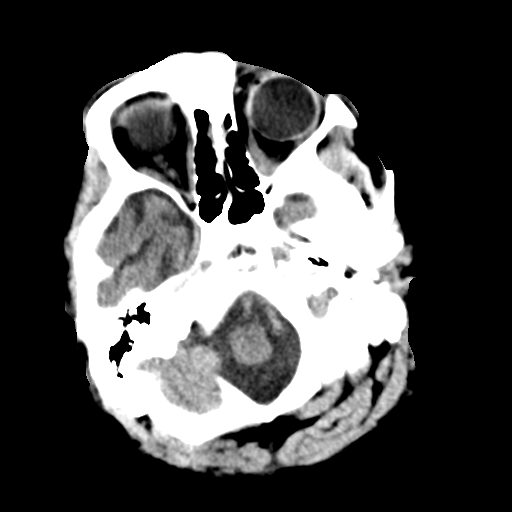
[im 4/31  bone]
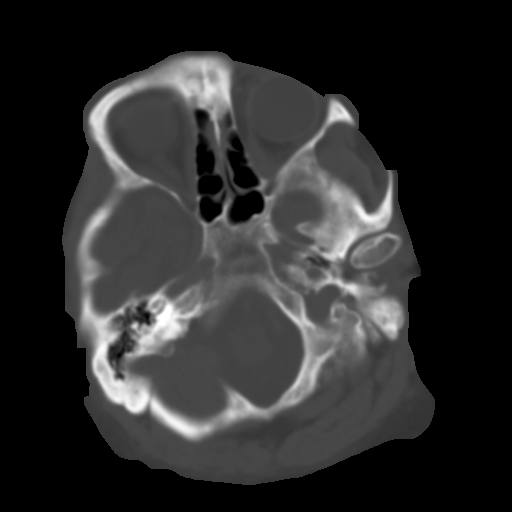
[im 8/31  brain]
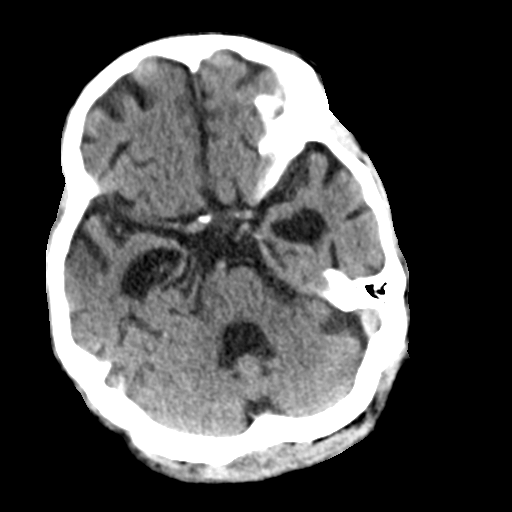
[im 12/31  brain]
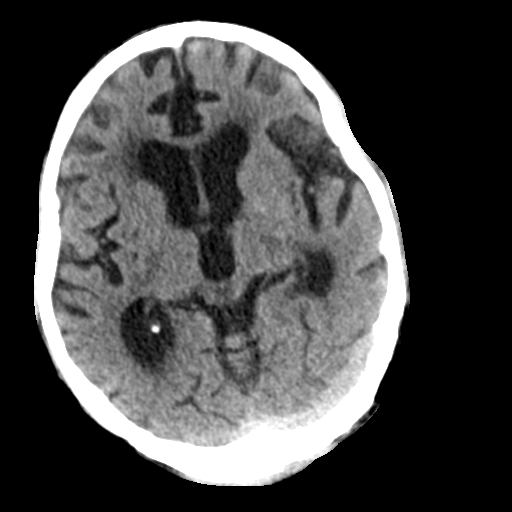
[im 16/31  brain]
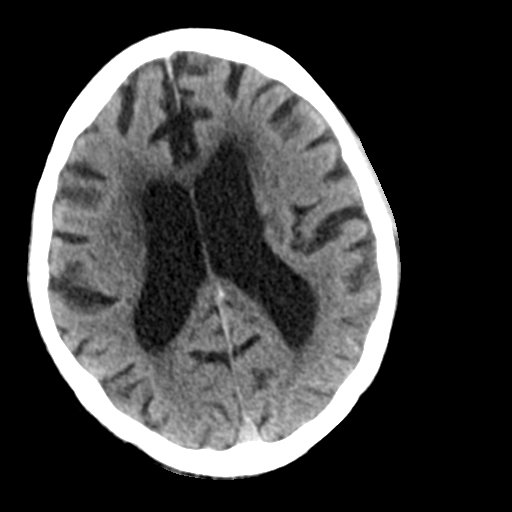
[im 19/31  brain]
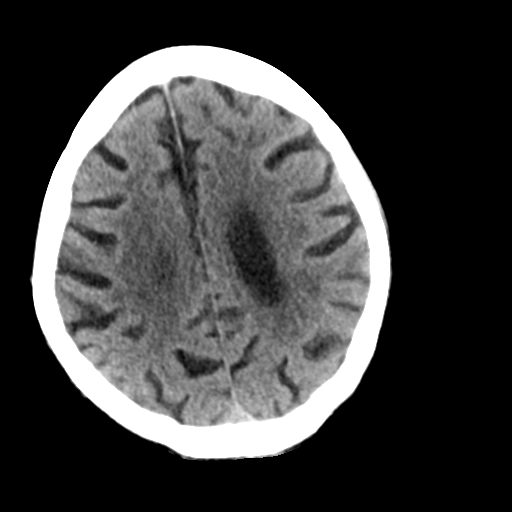
[im 19/31  bone]
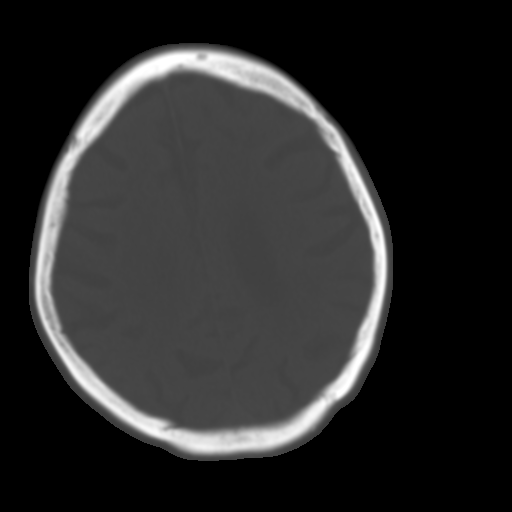
[im 23/31  brain]
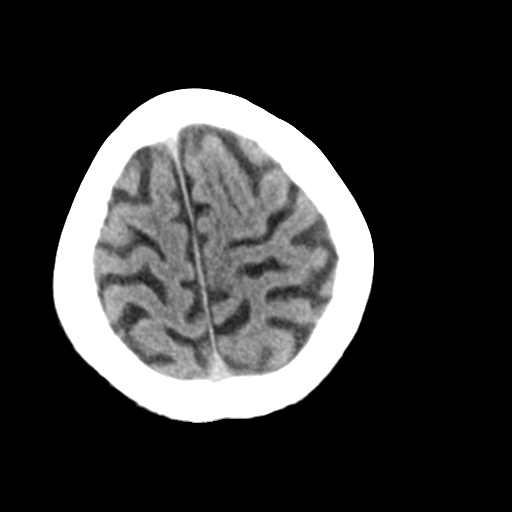
[im 27/31  brain]
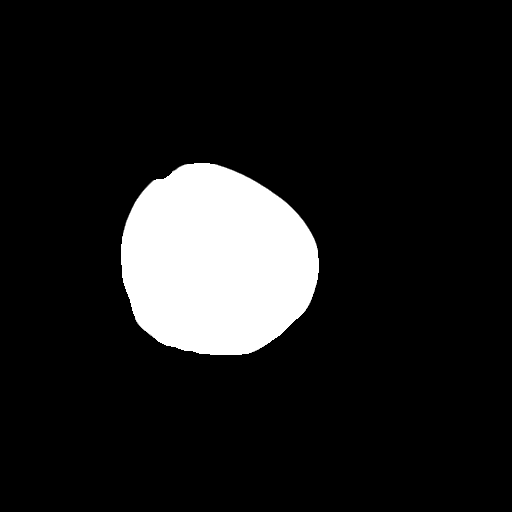

[Series 4: head bone · axial · 0.37mm/px · z∈[+873,+889]mm · 2 of 77 slices shown]
[im 8/77  bone]
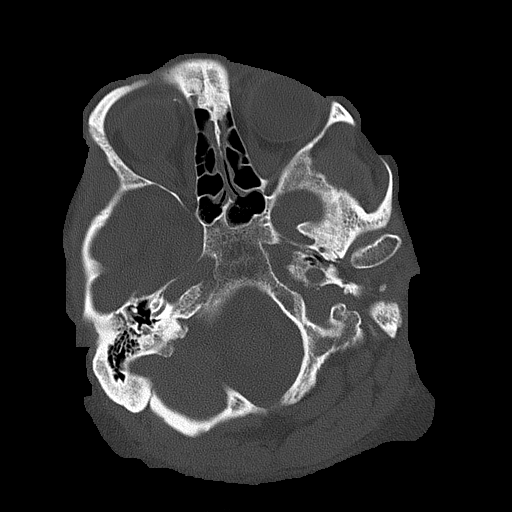
[im 16/77  bone]
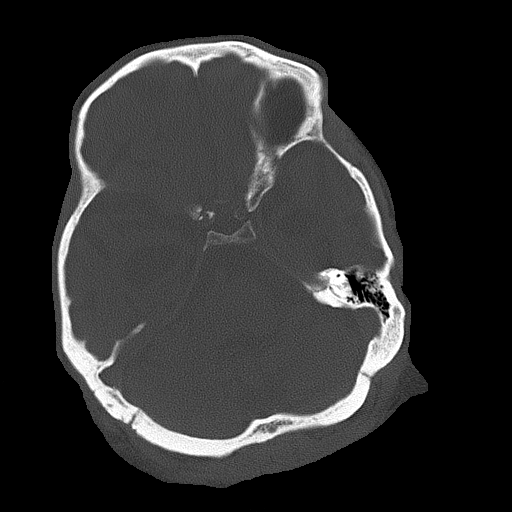

[Series 5: head without cor · coronal · non-contrast · 0.30mm/px · 3 of 66 slices shown]
[im 22/66  brain]
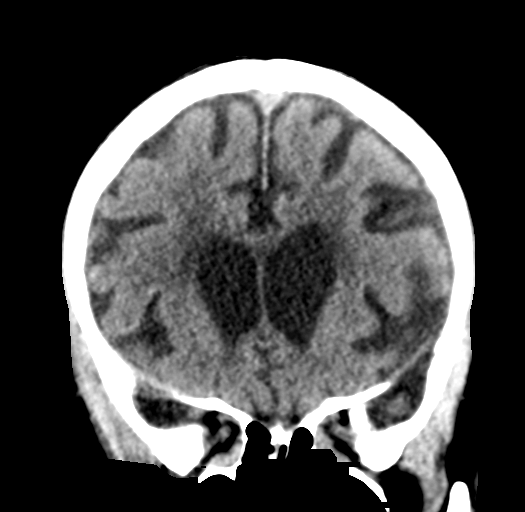
[im 29/66  brain]
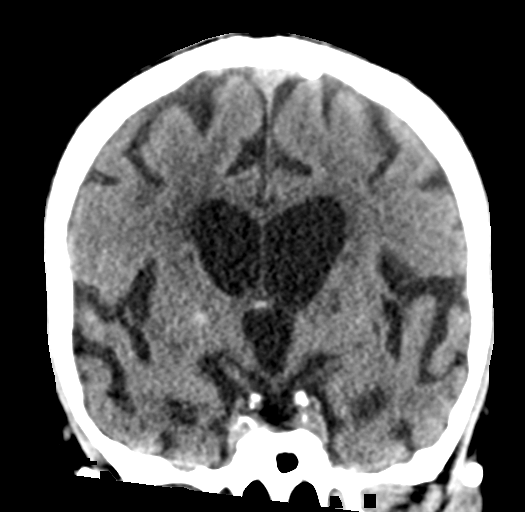
[im 37/66  brain]
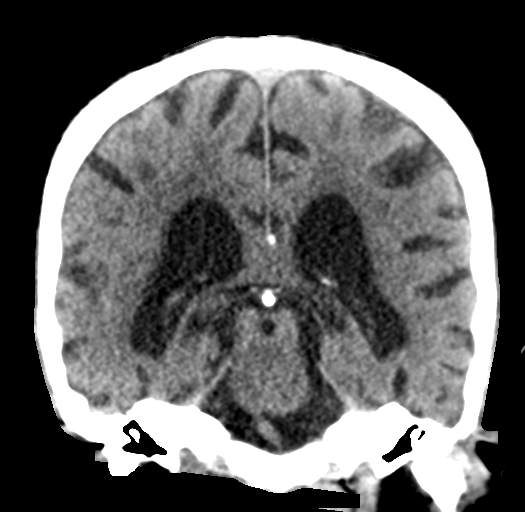

[Series 6: head without sag · sagittal · non-contrast · 0.30mm/px · 3 of 53 slices shown]
[im 18/53  brain]
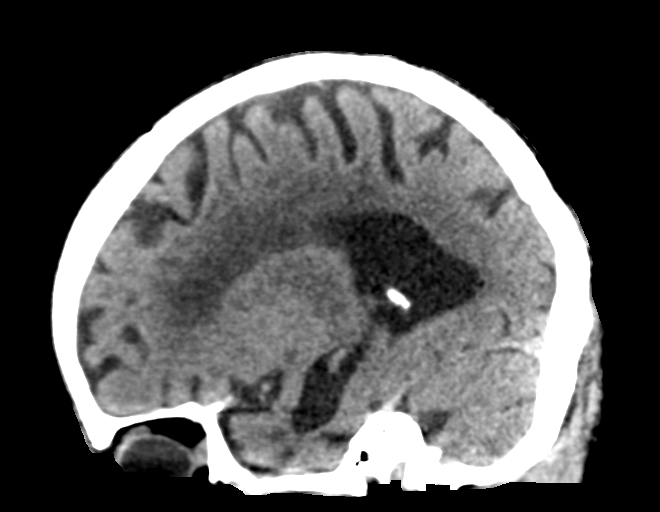
[im 27/53  brain]
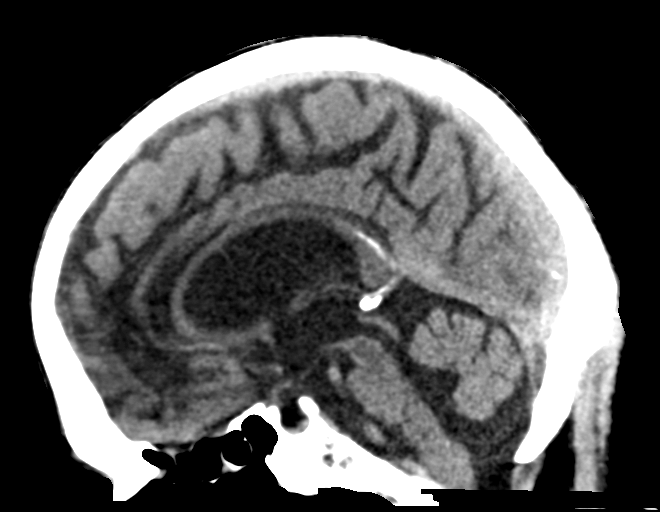
[im 35/53  brain]
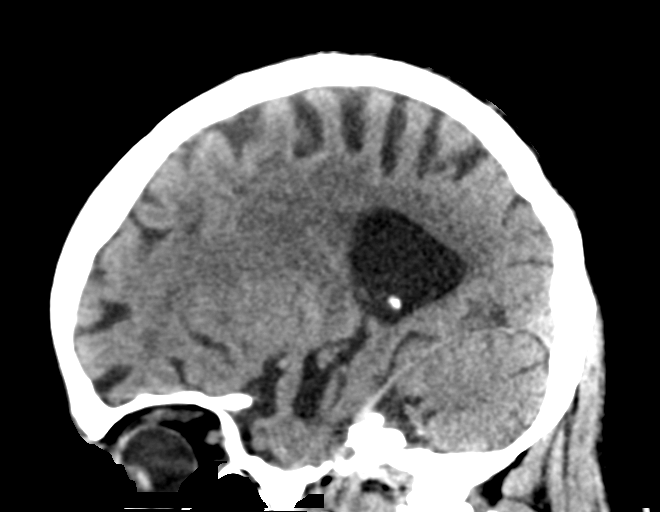

[15 of 47 positions shown; findings below may reference images not displayed]

FINDINGS: CT HEAD FINDINGS

Brain: No evidence of acute infarction, hemorrhage, hydrocephalus,
extra-axial collection or mass lesion/mass effect. Cerebral atrophy
with ex vacuo ventricular dilation. Mild chronic microvascular
ischemic disease. Partially empty sella.

Vascular: Calcific intracranial atherosclerosis. No hyperdense
vessel identified.

Skull: No evidence of acute fracture.

Other: No mastoid effusions.

CT MAXILLOFACIAL FINDINGS

Osseous: No fracture or mandibular dislocation. No destructive
process. Remote nasal bone fractures.

Orbits: Negative. No traumatic or inflammatory finding.

Sinuses: Frothy secretions in the sphenoid sinuses. Otherwise, clear
sinuses.

Soft tissues: Negative.

CT CERVICAL SPINE FINDINGS

Alignment: Similar alignment, including mild degenerative
retrolisthesis of C3 on C4. Broad levocurvature.

Skull base and vertebrae: No evidence of acute fracture. Similar
vertebral body heights.

Soft tissues and spinal canal: No prevertebral fluid or swelling. No
visible canal hematoma.

Disc levels: Similar multilevel degenerative change, including
severe multilevel degenerative disc disease and ossification of
posterior longitudinal in at C6.

Upper chest: Visualized lung apices are clear.
IMPRESSION: CT head:

1. No evidence of acute intracranial abnormality.
2.  Cerebral atrophy (98SIO-YCU.Y).

CT maxillofacial:

1. No evidence of acute fracture.
2. Remote bilateral nasal bone fractures.

CT cervical spine:

1. No evidence of acute fracture or traumatic malalignment.
2. Severe multilevel degenerative change.

## 2022-05-16 IMAGING — CT CT MAXILLOFACIAL W/O CM
3 series · 15 of 47 positions shown, 18 images · non-contrast
Comparison: CT cervical spine October 14, 2018. CT head 12/21/2018. CT
maxillofacial 02/05/2019.

CLINICAL DATA: Head trauma, moderate-severe; Facial trauma, blunt;
Neck trauma, dangerous injury mechanism (Age 16-64y)



[Series 3: facialbone 2.0 st · axial · 0.37mm/px · z∈[+765,+889]mm · 9 of 72 slices shown, 12 images]
[im 5/72  brain]
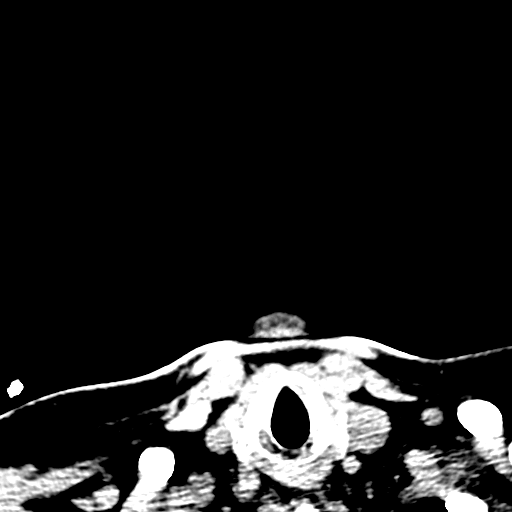
[im 5/72  bone]
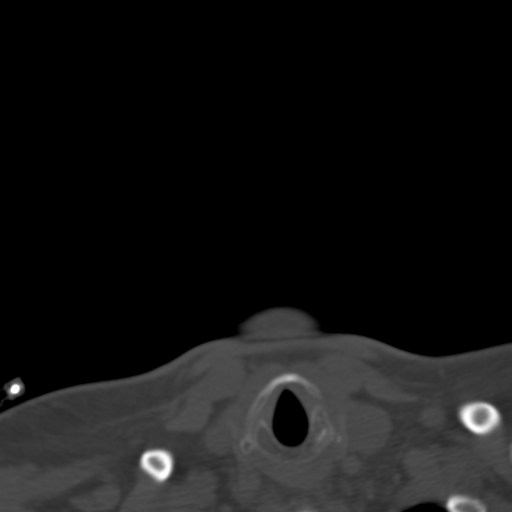
[im 13/72  bone]
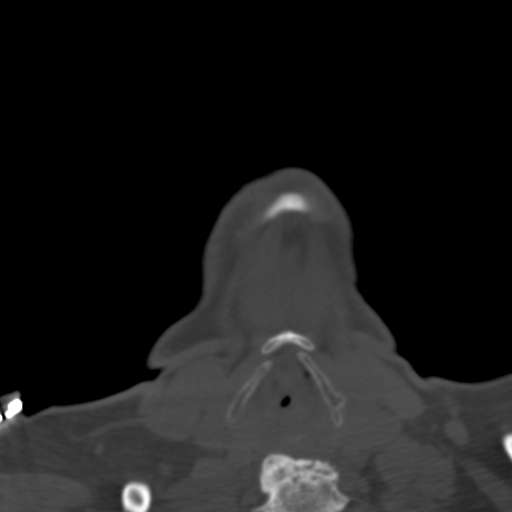
[im 20/72  bone]
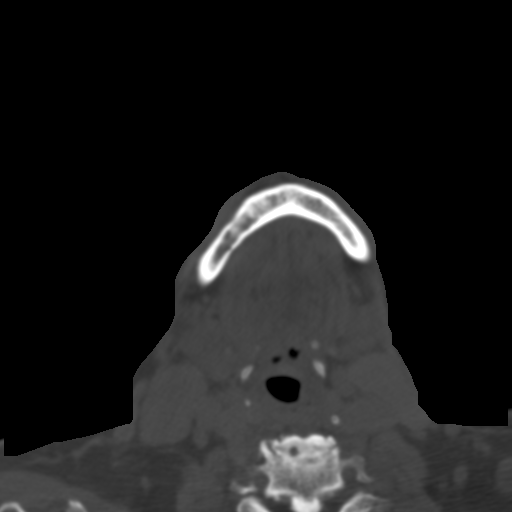
[im 27/72  bone]
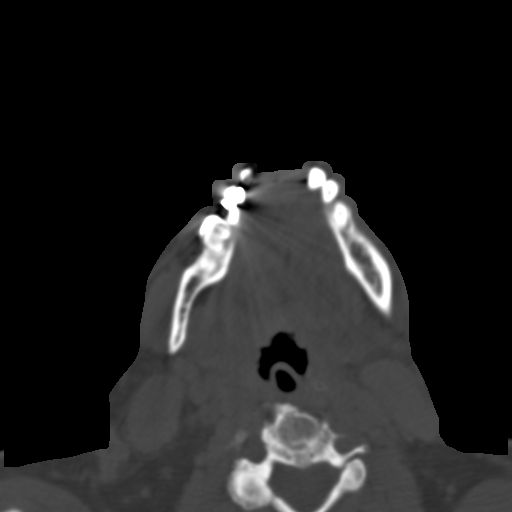
[im 37/72  brain]
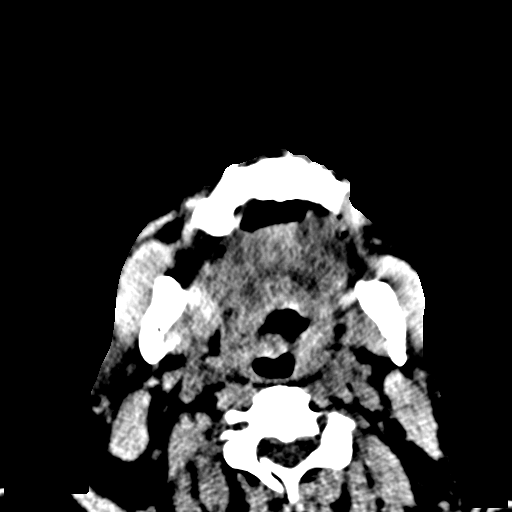
[im 37/72  bone]
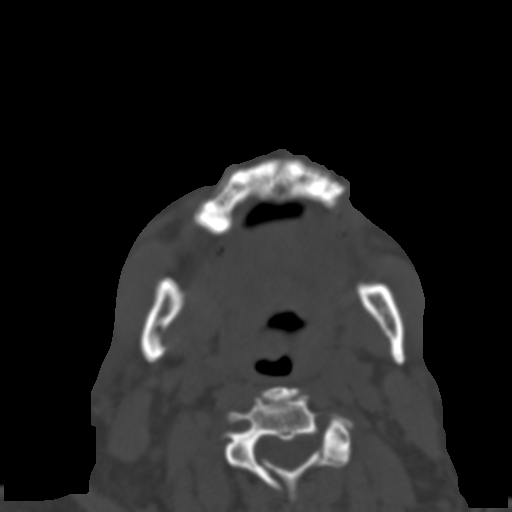
[im 45/72  bone]
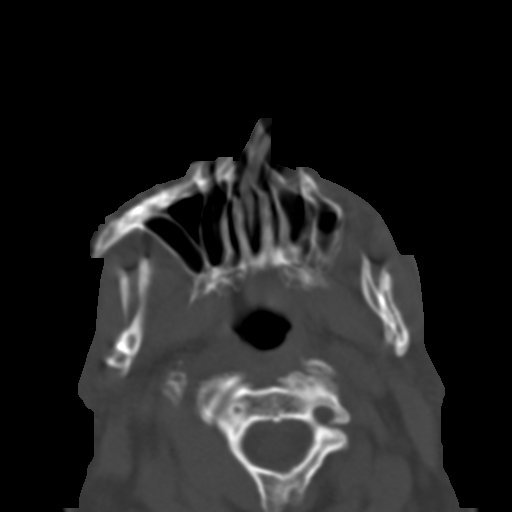
[im 52/72  bone]
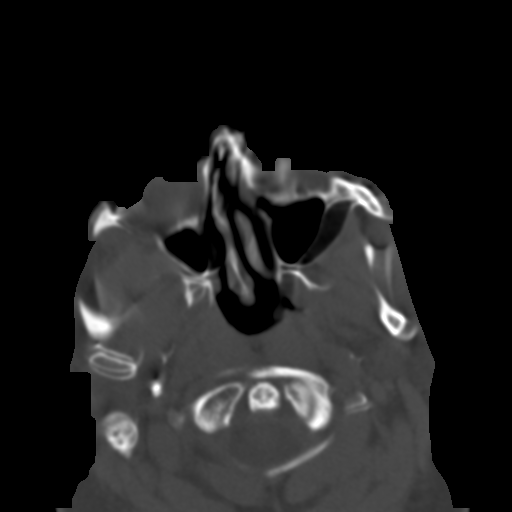
[im 59/72  bone]
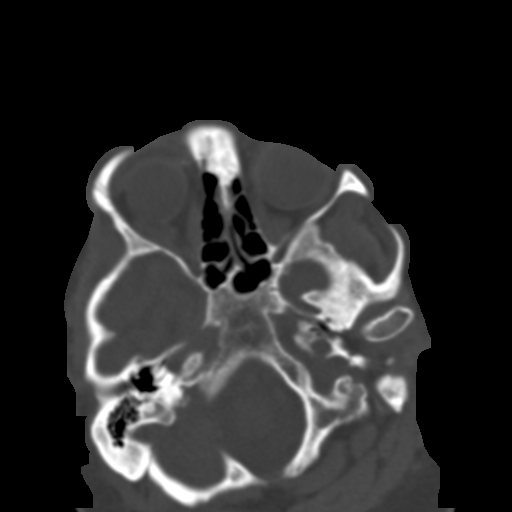
[im 67/72  brain]
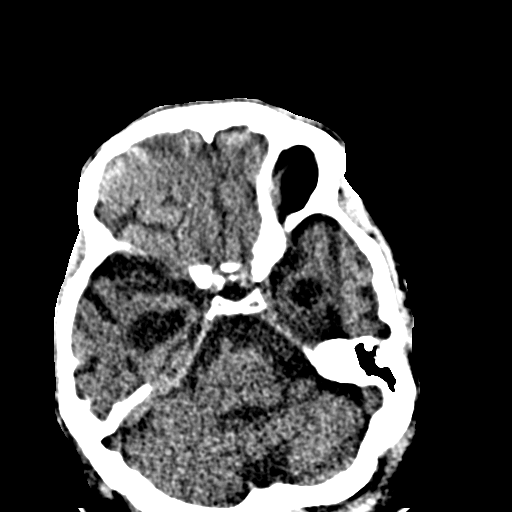
[im 67/72  bone]
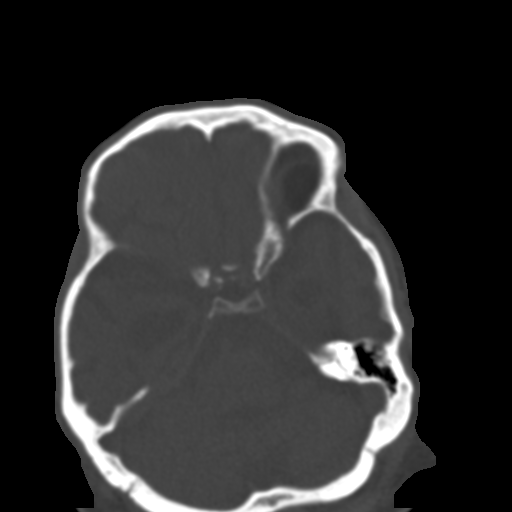

[Series 7: facialbone 2.0 cor st · coronal · 0.35mm/px · 3 of 85 slices shown]
[im 29/85  bone]
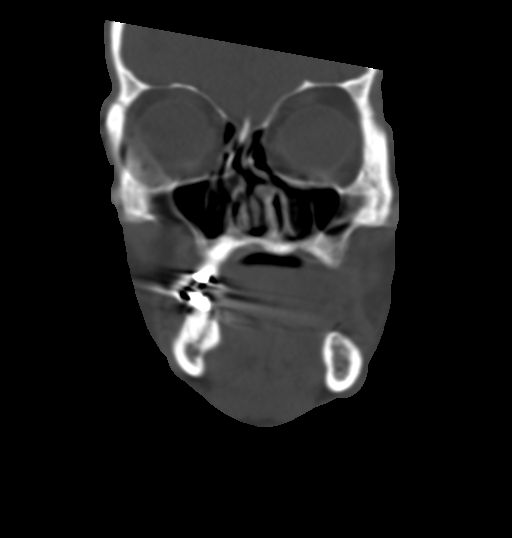
[im 38/85  bone]
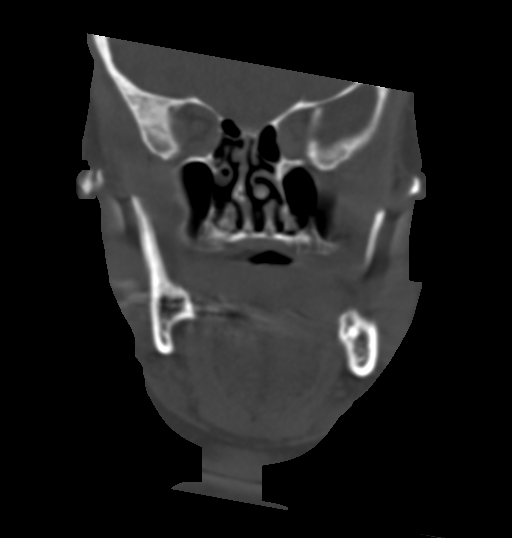
[im 47/85  bone]
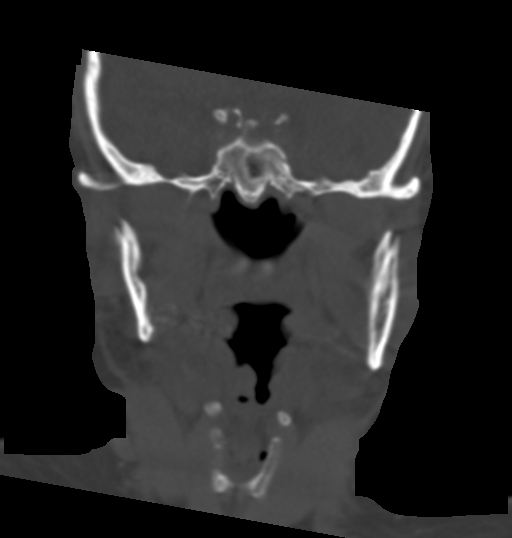

[Series 8: facialbone 2.0 sag st · sagittal · 0.33mm/px · 3 of 85 slices shown]
[im 30/85  bone]
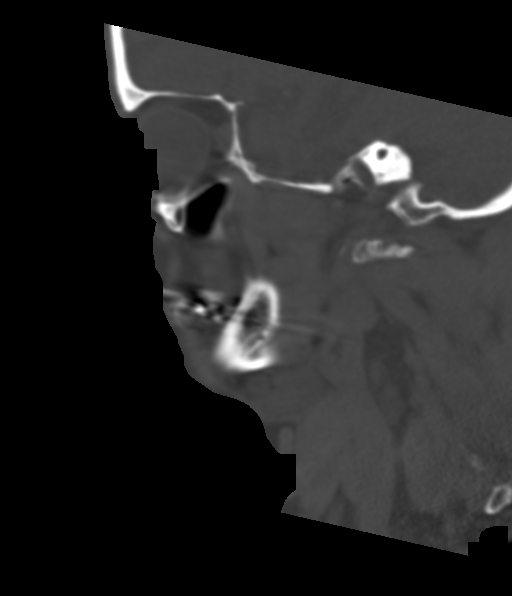
[im 43/85  bone]
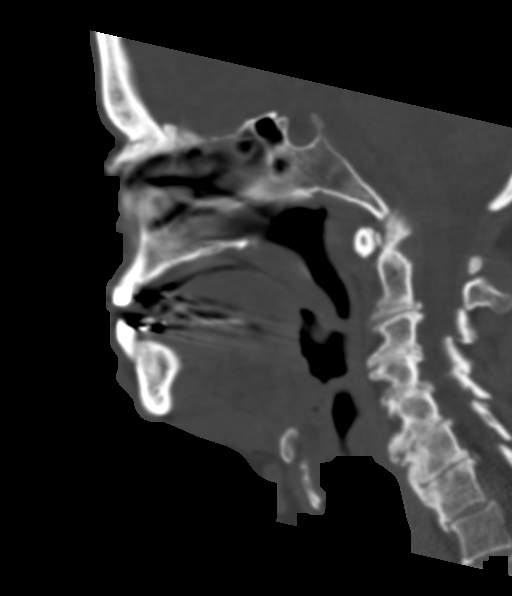
[im 56/85  bone]
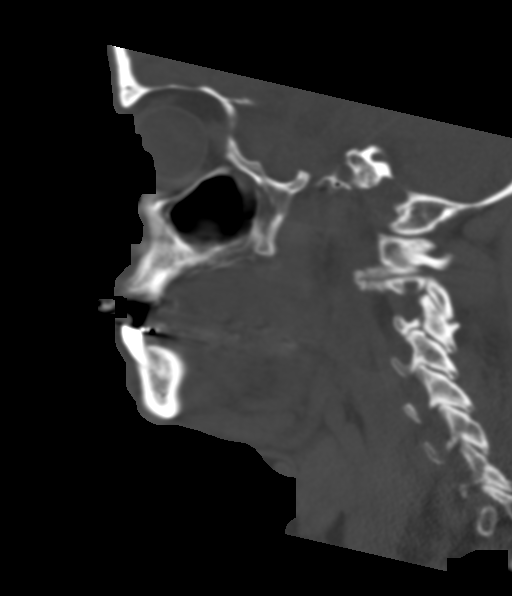

[15 of 47 positions shown; findings below may reference images not displayed]

FINDINGS: CT HEAD FINDINGS

Brain: No evidence of acute infarction, hemorrhage, hydrocephalus,
extra-axial collection or mass lesion/mass effect. Cerebral atrophy
with ex vacuo ventricular dilation. Mild chronic microvascular
ischemic disease. Partially empty sella.

Vascular: Calcific intracranial atherosclerosis. No hyperdense
vessel identified.

Skull: No evidence of acute fracture.

Other: No mastoid effusions.

CT MAXILLOFACIAL FINDINGS

Osseous: No fracture or mandibular dislocation. No destructive
process. Remote nasal bone fractures.

Orbits: Negative. No traumatic or inflammatory finding.

Sinuses: Frothy secretions in the sphenoid sinuses. Otherwise, clear
sinuses.

Soft tissues: Negative.

CT CERVICAL SPINE FINDINGS

Alignment: Similar alignment, including mild degenerative
retrolisthesis of C3 on C4. Broad levocurvature.

Skull base and vertebrae: No evidence of acute fracture. Similar
vertebral body heights.

Soft tissues and spinal canal: No prevertebral fluid or swelling. No
visible canal hematoma.

Disc levels: Similar multilevel degenerative change, including
severe multilevel degenerative disc disease and ossification of
posterior longitudinal in at C6.

Upper chest: Visualized lung apices are clear.
IMPRESSION: CT head:

1. No evidence of acute intracranial abnormality.
2.  Cerebral atrophy (98SIO-YCU.Y).

CT maxillofacial:

1. No evidence of acute fracture.
2. Remote bilateral nasal bone fractures.

CT cervical spine:

1. No evidence of acute fracture or traumatic malalignment.
2. Severe multilevel degenerative change.

## 2022-12-13 ENCOUNTER — Inpatient Hospital Stay (HOSPITAL_COMMUNITY): Payer: MEDICARE

## 2022-12-13 ENCOUNTER — Emergency Department (HOSPITAL_COMMUNITY): Payer: MEDICARE

## 2022-12-13 ENCOUNTER — Inpatient Hospital Stay (HOSPITAL_COMMUNITY)
Admission: EM | Admit: 2022-12-13 | Discharge: 2023-01-07 | DRG: 208 | Disposition: E | Payer: MEDICARE | Attending: Internal Medicine | Admitting: Internal Medicine

## 2022-12-13 ENCOUNTER — Other Ambulatory Visit: Payer: Self-pay

## 2022-12-13 ENCOUNTER — Encounter (HOSPITAL_COMMUNITY): Payer: Self-pay | Admitting: Pulmonary Disease

## 2022-12-13 DIAGNOSIS — M1990 Unspecified osteoarthritis, unspecified site: Secondary | ICD-10-CM | POA: Diagnosis present

## 2022-12-13 DIAGNOSIS — J9811 Atelectasis: Secondary | ICD-10-CM | POA: Diagnosis present

## 2022-12-13 DIAGNOSIS — I959 Hypotension, unspecified: Secondary | ICD-10-CM | POA: Diagnosis present

## 2022-12-13 DIAGNOSIS — G931 Anoxic brain damage, not elsewhere classified: Secondary | ICD-10-CM | POA: Diagnosis present

## 2022-12-13 DIAGNOSIS — J690 Pneumonitis due to inhalation of food and vomit: Principal | ICD-10-CM | POA: Diagnosis present

## 2022-12-13 DIAGNOSIS — G40909 Epilepsy, unspecified, not intractable, without status epilepticus: Secondary | ICD-10-CM | POA: Diagnosis present

## 2022-12-13 DIAGNOSIS — D539 Nutritional anemia, unspecified: Secondary | ICD-10-CM | POA: Diagnosis present

## 2022-12-13 DIAGNOSIS — Z7982 Long term (current) use of aspirin: Secondary | ICD-10-CM

## 2022-12-13 DIAGNOSIS — R9082 White matter disease, unspecified: Secondary | ICD-10-CM | POA: Diagnosis present

## 2022-12-13 DIAGNOSIS — I469 Cardiac arrest, cause unspecified: Secondary | ICD-10-CM | POA: Diagnosis present

## 2022-12-13 DIAGNOSIS — Z880 Allergy status to penicillin: Secondary | ICD-10-CM

## 2022-12-13 DIAGNOSIS — R7989 Other specified abnormal findings of blood chemistry: Secondary | ICD-10-CM | POA: Diagnosis present

## 2022-12-13 DIAGNOSIS — E039 Hypothyroidism, unspecified: Secondary | ICD-10-CM | POA: Diagnosis present

## 2022-12-13 DIAGNOSIS — Z7983 Long term (current) use of bisphosphonates: Secondary | ICD-10-CM

## 2022-12-13 DIAGNOSIS — I10 Essential (primary) hypertension: Secondary | ICD-10-CM | POA: Diagnosis present

## 2022-12-13 DIAGNOSIS — R739 Hyperglycemia, unspecified: Secondary | ICD-10-CM | POA: Diagnosis present

## 2022-12-13 DIAGNOSIS — R569 Unspecified convulsions: Secondary | ICD-10-CM | POA: Diagnosis not present

## 2022-12-13 DIAGNOSIS — Z79890 Hormone replacement therapy (postmenopausal): Secondary | ICD-10-CM

## 2022-12-13 DIAGNOSIS — Z881 Allergy status to other antibiotic agents status: Secondary | ICD-10-CM | POA: Diagnosis not present

## 2022-12-13 DIAGNOSIS — Z66 Do not resuscitate: Secondary | ICD-10-CM | POA: Diagnosis present

## 2022-12-13 DIAGNOSIS — Z515 Encounter for palliative care: Secondary | ICD-10-CM

## 2022-12-13 DIAGNOSIS — N170 Acute kidney failure with tubular necrosis: Secondary | ICD-10-CM | POA: Diagnosis present

## 2022-12-13 DIAGNOSIS — M810 Age-related osteoporosis without current pathological fracture: Secondary | ICD-10-CM | POA: Diagnosis present

## 2022-12-13 DIAGNOSIS — Z79899 Other long term (current) drug therapy: Secondary | ICD-10-CM

## 2022-12-13 DIAGNOSIS — E872 Acidosis, unspecified: Secondary | ICD-10-CM | POA: Diagnosis present

## 2022-12-13 DIAGNOSIS — Z9181 History of falling: Secondary | ICD-10-CM | POA: Diagnosis not present

## 2022-12-13 DIAGNOSIS — Q909 Down syndrome, unspecified: Secondary | ICD-10-CM

## 2022-12-13 DIAGNOSIS — J9601 Acute respiratory failure with hypoxia: Secondary | ICD-10-CM | POA: Diagnosis present

## 2022-12-13 DIAGNOSIS — D638 Anemia in other chronic diseases classified elsewhere: Secondary | ICD-10-CM | POA: Diagnosis present

## 2022-12-13 LAB — I-STAT ARTERIAL BLOOD GAS, ED
Acid-base deficit: 5 mmol/L — ABNORMAL HIGH (ref 0.0–2.0)
Bicarbonate: 18.4 mmol/L — ABNORMAL LOW (ref 20.0–28.0)
Calcium, Ion: 1.08 mmol/L — ABNORMAL LOW (ref 1.15–1.40)
HCT: 37 % — ABNORMAL LOW (ref 39.0–52.0)
Hemoglobin: 12.6 g/dL — ABNORMAL LOW (ref 13.0–17.0)
O2 Saturation: 100 %
Patient temperature: 96.6
Potassium: 3.7 mmol/L (ref 3.5–5.1)
Sodium: 134 mmol/L — ABNORMAL LOW (ref 135–145)
TCO2: 19 mmol/L — ABNORMAL LOW (ref 22–32)
pCO2 arterial: 27.7 mmHg — ABNORMAL LOW (ref 32–48)
pH, Arterial: 7.425 (ref 7.35–7.45)
pO2, Arterial: 260 mmHg — ABNORMAL HIGH (ref 83–108)

## 2022-12-13 LAB — CREATININE, SERUM
Creatinine, Ser: 1.06 mg/dL (ref 0.61–1.24)
GFR, Estimated: >60 mL/min (ref >60)

## 2022-12-13 LAB — CBC
HCT: 39.9 % (ref 39.0–52.0)
HCT: 40.1 % (ref 39.0–52.0)
HCT: 39.2 % (ref 39.0–52.0)
Hemoglobin: 13 g/dL (ref 13.0–17.0)
Hemoglobin: 13.5 g/dL (ref 13.0–17.0)
Hemoglobin: 13.6 g/dL (ref 13.0–17.0)
MCH: 37.4 pg — ABNORMAL HIGH (ref 26.0–34.0)
MCH: 37.9 pg — ABNORMAL HIGH (ref 26.0–34.0)
MCH: 37.3 pg — ABNORMAL HIGH (ref 26.0–34.0)
MCHC: 32.6 g/dL (ref 30.0–36.0)
MCHC: 33.7 g/dL (ref 30.0–36.0)
MCHC: 34.7 g/dL (ref 30.0–36.0)
MCV: 114.7 fL — ABNORMAL HIGH (ref 80.0–100.0)
MCV: 112.6 fL — ABNORMAL HIGH (ref 80.0–100.0)
MCV: 107.4 fL — ABNORMAL HIGH (ref 80.0–100.0)
Platelets: 296 10*3/uL (ref 150–400)
Platelets: 293 10*3/uL (ref 150–400)
Platelets: 279 10*3/uL (ref 150–400)
RBC: 3.48 MIL/uL — ABNORMAL LOW (ref 4.22–5.81)
RBC: 3.56 MIL/uL — ABNORMAL LOW (ref 4.22–5.81)
RBC: 3.65 MIL/uL — ABNORMAL LOW (ref 4.22–5.81)
RDW: 12.8 % (ref 11.5–15.5)
RDW: 12.7 % (ref 11.5–15.5)
RDW: 12.7 % (ref 11.5–15.5)
WBC: 18.8 10*3/uL — ABNORMAL HIGH (ref 4.0–10.5)
WBC: 19.1 10*3/uL — ABNORMAL HIGH (ref 4.0–10.5)
WBC: 17.8 10*3/uL — ABNORMAL HIGH (ref 4.0–10.5)
nRBC: 0.1 % (ref 0.0–0.2)
nRBC: 0 % (ref 0.0–0.2)
nRBC: 0 % (ref 0.0–0.2)

## 2022-12-13 LAB — GLUCOSE, CAPILLARY
Glucose-Capillary: 236 mg/dL — ABNORMAL HIGH (ref 70–99)
Glucose-Capillary: 113 mg/dL — ABNORMAL HIGH (ref 70–99)
Glucose-Capillary: 133 mg/dL — ABNORMAL HIGH (ref 70–99)
Glucose-Capillary: 134 mg/dL — ABNORMAL HIGH (ref 70–99)
Glucose-Capillary: 115 mg/dL — ABNORMAL HIGH (ref 70–99)

## 2022-12-13 LAB — ECHOCARDIOGRAM COMPLETE
Height: 55 in
S' Lateral: 2.7 cm
Weight: 2035.29 oz

## 2022-12-13 LAB — I-STAT CG4 LACTIC ACID, ED
Lactic Acid, Venous: 8.2 mmol/L (ref 0.5–1.9)
Lactic Acid, Venous: 2.7 mmol/L (ref 0.5–1.9)

## 2022-12-13 LAB — LACTIC ACID, PLASMA: Lactic Acid, Venous: 3.1 mmol/L (ref 0.5–1.9)

## 2022-12-13 LAB — COMPREHENSIVE METABOLIC PANEL
ALT: 31 U/L (ref 0–44)
ALT: 34 U/L (ref 0–44)
AST: 41 U/L (ref 15–41)
AST: 66 U/L — ABNORMAL HIGH (ref 15–41)
Albumin: 2.6 g/dL — ABNORMAL LOW (ref 3.5–5.0)
Albumin: 2.6 g/dL — ABNORMAL LOW (ref 3.5–5.0)
Alkaline Phosphatase: 117 U/L (ref 38–126)
Alkaline Phosphatase: 87 U/L (ref 38–126)
Anion gap: 15 (ref 5–15)
Anion gap: 11 (ref 5–15)
BUN: 14 mg/dL (ref 8–23)
BUN: 14 mg/dL (ref 8–23)
CO2: 18 mmol/L — ABNORMAL LOW (ref 22–32)
CO2: 17 mmol/L — ABNORMAL LOW (ref 22–32)
Calcium: 8.1 mg/dL — ABNORMAL LOW (ref 8.9–10.3)
Calcium: 7.8 mg/dL — ABNORMAL LOW (ref 8.9–10.3)
Chloride: 98 mmol/L (ref 98–111)
Chloride: 103 mmol/L (ref 98–111)
Creatinine, Ser: 1.28 mg/dL — ABNORMAL HIGH (ref 0.61–1.24)
Creatinine, Ser: 0.98 mg/dL (ref 0.61–1.24)
GFR, Estimated: >60 mL/min (ref >60)
GFR, Estimated: >60 mL/min (ref >60)
Glucose, Bld: 218 mg/dL — ABNORMAL HIGH (ref 70–99)
Glucose, Bld: 165 mg/dL — ABNORMAL HIGH (ref 70–99)
Potassium: 4 mmol/L (ref 3.5–5.1)
Potassium: 4.2 mmol/L (ref 3.5–5.1)
Sodium: 131 mmol/L — ABNORMAL LOW (ref 135–145)
Sodium: 131 mmol/L — ABNORMAL LOW (ref 135–145)
Total Bilirubin: 0.4 mg/dL (ref 0.3–1.2)
Total Bilirubin: 0.5 mg/dL (ref 0.3–1.2)
Total Protein: 6.5 g/dL (ref 6.5–8.1)
Total Protein: 6.4 g/dL — ABNORMAL LOW (ref 6.5–8.1)

## 2022-12-13 LAB — MRSA NEXT GEN BY PCR, NASAL: MRSA by PCR Next Gen: NOT DETECTED

## 2022-12-13 LAB — HIV ANTIBODY (ROUTINE TESTING W REFLEX): HIV Screen 4th Generation wRfx: NONREACTIVE

## 2022-12-13 LAB — HEMOGLOBIN A1C
Hgb A1c MFr Bld: 5.5 % (ref 4.8–5.6)
Mean Plasma Glucose: 111.15 mg/dL

## 2022-12-13 LAB — TROPONIN I (HIGH SENSITIVITY)
Troponin I (High Sensitivity): 56 ng/L — ABNORMAL HIGH (ref <18)
Troponin I (High Sensitivity): 137 ng/L (ref <18)
Troponin I (High Sensitivity): 226 ng/L (ref <18)

## 2022-12-13 LAB — PHOSPHORUS: Phosphorus: 2.9 mg/dL (ref 2.5–4.6)

## 2022-12-13 LAB — MAGNESIUM
Magnesium: 1.9 mg/dL (ref 1.7–2.4)
Magnesium: 1.9 mg/dL (ref 1.7–2.4)

## 2022-12-13 LAB — CG4 I-STAT (LACTIC ACID): Lactic Acid, Venous: 1.6 mmol/L (ref 0.5–1.9)

## 2022-12-13 MED ORDER — ACETAMINOPHEN 325 MG PO TABS: 650.0000 mg | ORAL_TABLET | ORAL | Status: DC | PRN

## 2022-12-13 MED ORDER — ORAL CARE MOUTH RINSE: 15.0000 mL | OROMUCOSAL | Status: DC | PRN

## 2022-12-13 MED ORDER — FENTANYL CITRATE PF 50 MCG/ML IJ SOSY
50.0000 ug | PREFILLED_SYRINGE | INTRAMUSCULAR | Status: DC | PRN
  Administered 2022-12-15 – 2022-12-16 (×5): 50 ug via INTRAVENOUS
  Administered 2022-12-16: 100 ug via INTRAVENOUS
  Filled 2022-12-13 (×6): qty 1
  Filled 2022-12-13: qty 2

## 2022-12-13 MED ORDER — MIDAZOLAM HCL 2 MG/2ML IJ SOLN
INTRAMUSCULAR | Status: AC
  Administered 2022-12-13: 2 mg via INTRAVENOUS
  Filled 2022-12-13: qty 2

## 2022-12-13 MED ORDER — ACETAMINOPHEN 160 MG/5ML PO SOLN
650.0000 mg | ORAL | Status: DC
  Administered 2022-12-14: 650 mg
  Filled 2022-12-13: qty 20.3

## 2022-12-13 MED ORDER — PROPOFOL 1000 MG/100ML IV EMUL
5.0000 ug/kg/min | INTRAVENOUS | Status: DC
  Administered 2022-12-13: 30 ug/kg/min via INTRAVENOUS
  Administered 2022-12-13: 5 ug/kg/min via INTRAVENOUS
  Administered 2022-12-13: 20 ug/kg/min via INTRAVENOUS
  Administered 2022-12-14: 30 ug/kg/min via INTRAVENOUS
  Administered 2022-12-14: 20 ug/kg/min via INTRAVENOUS
  Administered 2022-12-15 – 2022-12-16 (×5): 30 ug/kg/min via INTRAVENOUS
  Administered 2022-12-16: 25 ug/kg/min via INTRAVENOUS
  Filled 2022-12-13 (×10): qty 100

## 2022-12-13 MED ORDER — DOCUSATE SODIUM 100 MG PO CAPS: 100.0000 mg | ORAL_CAPSULE | Freq: Two times a day (BID) | ORAL | Status: DC | PRN

## 2022-12-13 MED ORDER — ACETAMINOPHEN 650 MG RE SUPP
650.0000 mg | RECTAL | Status: DC | PRN
  Administered 2022-12-16: 650 mg via RECTAL
  Filled 2022-12-13: qty 1

## 2022-12-13 MED ORDER — SODIUM CHLORIDE 0.9 % IV SOLN
250.0000 mL | INTRAVENOUS | Status: DC
  Administered 2022-12-13: 250 mL via INTRAVENOUS

## 2022-12-13 MED ORDER — MIDAZOLAM HCL 2 MG/2ML IJ SOLN: 2.0000 mg | Freq: Once | INTRAMUSCULAR | Status: AC

## 2022-12-13 MED ORDER — FENTANYL CITRATE PF 50 MCG/ML IJ SOSY
50.0000 ug | PREFILLED_SYRINGE | INTRAMUSCULAR | Status: AC | PRN
  Administered 2022-12-14 – 2022-12-15 (×3): 50 ug via INTRAVENOUS
  Filled 2022-12-13 (×3): qty 1

## 2022-12-13 MED ORDER — DOCUSATE SODIUM 50 MG/5ML PO LIQD: 100.0000 mg | Freq: Two times a day (BID) | ORAL | Status: DC

## 2022-12-13 MED ORDER — FAMOTIDINE 20 MG PO TABS: 20.0000 mg | ORAL_TABLET | Freq: Two times a day (BID) | ORAL | Status: DC

## 2022-12-13 MED ORDER — POLYETHYLENE GLYCOL 3350 17 G PO PACK: 17.0000 g | PACK | Freq: Every day | ORAL | Status: DC | PRN

## 2022-12-13 MED ORDER — NOREPINEPHRINE 4 MG/250ML-% IV SOLN
INTRAVENOUS | Status: AC
  Administered 2022-12-13: 2 ug/min via INTRAVENOUS
  Filled 2022-12-13: qty 250

## 2022-12-13 MED ORDER — ACETAMINOPHEN 325 MG PO TABS
650.0000 mg | ORAL_TABLET | ORAL | Status: DC
  Filled 2022-12-13: qty 2

## 2022-12-13 MED ORDER — LEVETIRACETAM IN NACL 1000 MG/100ML IV SOLN
1000.0000 mg | Freq: Two times a day (BID) | INTRAVENOUS | Status: DC
  Administered 2022-12-13 – 2022-12-16 (×8): 1000 mg via INTRAVENOUS
  Filled 2022-12-13 (×8): qty 100

## 2022-12-13 MED ORDER — DOCUSATE SODIUM 50 MG/5ML PO LIQD: 100.0000 mg | Freq: Two times a day (BID) | ORAL | Status: DC | PRN

## 2022-12-13 MED ORDER — MIDAZOLAM HCL 2 MG/2ML IJ SOLN
1.0000 mg | INTRAMUSCULAR | Status: DC | PRN
  Administered 2022-12-16 (×4): 2 mg via INTRAVENOUS
  Filled 2022-12-13 (×5): qty 2

## 2022-12-13 MED ORDER — SENNOSIDES 8.8 MG/5ML PO SYRP
5.0000 mL | ORAL_SOLUTION | Freq: Two times a day (BID) | ORAL | Status: DC
  Administered 2022-12-13 – 2022-12-16 (×7): 5 mL
  Filled 2022-12-13 (×7): qty 5

## 2022-12-13 MED ORDER — BUSPIRONE HCL 10 MG PO TABS: 30.0000 mg | ORAL_TABLET | Freq: Three times a day (TID) | ORAL | Status: AC | PRN

## 2022-12-13 MED ORDER — ACETAMINOPHEN 160 MG/5ML PO SOLN
650.0000 mg | ORAL | Status: DC | PRN
  Administered 2022-12-16: 650 mg
  Filled 2022-12-13: qty 20.3

## 2022-12-13 MED ORDER — ORAL CARE MOUTH RINSE
15.0000 mL | OROMUCOSAL | Status: DC
  Administered 2022-12-13 – 2022-12-16 (×40): 15 mL via OROMUCOSAL

## 2022-12-13 MED ORDER — ACETAMINOPHEN 650 MG RE SUPP: 650.0000 mg | RECTAL | Status: DC

## 2022-12-13 MED ORDER — INSULIN ASPART 100 UNIT/ML IJ SOLN
0.0000 [IU] | INTRAMUSCULAR | Status: DC
  Administered 2022-12-13: 3 [IU] via SUBCUTANEOUS
  Administered 2022-12-13 (×2): 1 [IU] via SUBCUTANEOUS

## 2022-12-13 MED ORDER — ENOXAPARIN SODIUM 40 MG/0.4ML IJ SOSY
40.0000 mg | PREFILLED_SYRINGE | INTRAMUSCULAR | Status: DC
  Administered 2022-12-13 – 2022-12-16 (×4): 40 mg via SUBCUTANEOUS
  Filled 2022-12-13 (×4): qty 0.4

## 2022-12-13 MED ORDER — LEVOFLOXACIN IN D5W 750 MG/150ML IV SOLN
750.0000 mg | Freq: Once | INTRAVENOUS | Status: AC
  Administered 2022-12-13: 750 mg via INTRAVENOUS
  Filled 2022-12-13: qty 150

## 2022-12-13 MED ORDER — SODIUM CHLORIDE 0.9 % IV BOLUS
1000.0000 mL | Freq: Once | INTRAVENOUS | Status: AC
  Administered 2022-12-13: 1000 mL via INTRAVENOUS

## 2022-12-13 MED ORDER — NOREPINEPHRINE 4 MG/250ML-% IV SOLN: 2.0000 ug/min | INTRAVENOUS | Status: DC

## 2022-12-13 MED ORDER — ONDANSETRON HCL 4 MG/2ML IJ SOLN: 4.0000 mg | Freq: Four times a day (QID) | INTRAMUSCULAR | Status: DC | PRN

## 2022-12-13 MED ORDER — MAGNESIUM SULFATE 2 GM/50ML IV SOLN
2.0000 g | Freq: Once | INTRAVENOUS | Status: AC | PRN
  Administered 2022-12-13: 2 g via INTRAVENOUS
  Filled 2022-12-13: qty 50

## 2022-12-13 MED ORDER — CHLORHEXIDINE GLUCONATE CLOTH 2 % EX PADS
6.0000 | MEDICATED_PAD | Freq: Every day | CUTANEOUS | Status: DC
  Administered 2022-12-14 – 2022-12-16 (×4): 6 via TOPICAL

## 2022-12-13 MED ORDER — IOHEXOL 350 MG/ML SOLN
75.0000 mL | Freq: Once | INTRAVENOUS | Status: AC | PRN
  Administered 2022-12-13: 75 mL via INTRAVENOUS

## 2022-12-13 MED ORDER — POLYETHYLENE GLYCOL 3350 17 G PO PACK
17.0000 g | PACK | Freq: Every day | ORAL | Status: DC
  Administered 2022-12-13 – 2022-12-16 (×4): 17 g
  Filled 2022-12-13 (×4): qty 1

## 2022-12-13 MED ORDER — FENTANYL CITRATE PF 50 MCG/ML IJ SOSY: 50.0000 ug | PREFILLED_SYRINGE | INTRAMUSCULAR | Status: DC | PRN

## 2022-12-13 MED ORDER — PANTOPRAZOLE SODIUM 40 MG IV SOLR
40.0000 mg | Freq: Every day | INTRAVENOUS | Status: DC
  Administered 2022-12-13 – 2022-12-15 (×3): 40 mg via INTRAVENOUS
  Filled 2022-12-13 (×3): qty 10

## 2022-12-13 NOTE — ED Notes (Signed)
Caregiver at bedside

## 2022-12-13 NOTE — Progress Notes (Signed)
RT pulled et tube back to 23@ lip post cxr per MD.

## 2022-12-13 NOTE — Progress Notes (Signed)
RT and RN transported vent patient to [facility] ICU. Vital signs stable through out.

## 2022-12-13 NOTE — Progress Notes (Signed)
EEG complete - results pending 

## 2022-12-13 NOTE — Progress Notes (Signed)
Patient's legal guardian Vidal Schwalbe (niece) called and provided with update. States that she is about 3 hours out from hospital but is on the way in. Set up patient password with Herbert Seta, she will disperse password to family.

## 2022-12-13 NOTE — ED Notes (Signed)
BVM

## 2022-12-13 NOTE — ED Notes (Signed)
To CT

## 2022-12-13 NOTE — Progress Notes (Signed)
LTM EEG hooked up and running - no initial skin breakdown - push button tested - [facility] monitoring.  

## 2022-12-13 NOTE — Procedures (Signed)
Patient Name: Travis Hanna  MRN: [id number]  Epilepsy Attending: Windell Norfolk  Date: 12/13/2022 Duration: 30 minutes   Patient history: 67 year old man with down syndrome, seizure disorder, s/p cardiac arrest. EEG for background characterization   Level of alertness: Intubated and sedated   AEDs during EEG study:   Technical aspects: This EEG study was done with scalp electrodes positioned according to the 10-20 International system of electrode placement. Electrical activity was reviewed with band pass filter of 1-70Hz , sensitivity of 7 uV/mm, display speed of 47mm/sec with a 60Hz  notched filter applied as appropriate. EEG data were recorded continuously and digitally stored.  Video monitoring was available and reviewed as appropriate.  Description: EEG was diffusely suppressed with no reactivity. There were 2 separate bursts of polyspikes associated with eye opening and full body jerk consistent with cortical myoclonus.   .     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 In the setting of cardiac arrest, this EEG pattern is suggestive of anoxic/hypoxic brain injury.  Dr. Merrily Pew was notified via epic chat.    Travis Hanna

## 2022-12-13 NOTE — Progress Notes (Signed)
Echocardiogram 2D Echocardiogram has been performed.  Travis Hanna 12/13/2022, 11:29 AM

## 2022-12-13 NOTE — ED Provider Notes (Signed)
[facility]ENCY DEPT [facility] Emergency Department Provider Note MRN:  [id number]  Arrival date & time: 12/13/22     Chief Complaint   Cardiac arrest History of Present Illness   Travis Hanna is a 67 y.o. year-old male with a history of Down syndrome, seizure disorder presenting to the ED with chief complaint of cardiac arrest.  Patient coming from care facility.  Was seen at his baseline at 10 PM.  Called for unresponsiveness and pulselessness at midnight.  EMS arrived and patient was pulseless with asystole.  CPR for 6 minutes.  1 dose of epinephrine.  Return of spontaneous circulation.  Review of Systems  A thorough review of systems was obtained and all systems are negative except as noted in the HPI and PMH.   Patient's Health History    Past Medical History:  Diagnosis Date   Anemia    Arthritis    Down syndrome    Frequent falls    Hypertension    now with hypotension   Hypothyroidism    MR (mental retardation)    Myopia with astigmatism    Osteoporosis    Seizure disorder [facility])     Past Surgical History:  Procedure Laterality Date   CLOSED REDUCTION NASAL FRACTURE N/A 02/18/2019   Procedure: CLOSED REDUCTION NASAL FRACTURE;  Surgeon: Glenna Fellows, MD;  Location: [facility] OR;  Service: Plastics;  Laterality: N/A;    Family History  Family history unknown: Yes    Social History   Socioeconomic History   Marital status: Single    Spouse name: Not on file   Number of children: 0   Years of education: Not on file   Highest education level: Not on file  Occupational History   Occupation: disabled  Tobacco Use   Smoking status: Never   Smokeless tobacco: Never  Vaping Use   Vaping status: Never Used  Substance and Sexual Activity   Alcohol use: No   Drug use: No   Sexual activity: Never  Other Topics Concern   Not on file  Social History Narrative   Not on file   Social Determinants of Health   Financial Resource Strain: Not on file   Food Insecurity: Not on file  Transportation Needs: Not on file  Physical Activity: Not on file  Stress: Not on file  Social Connections: Unknown (05/05/2022)   Received from [facility]   Social Network    Social Network: Not on file  Intimate Partner Violence: Unknown (05/05/2022)   Received from [facility]   HITS    Physically Hurt: Not on file    Insult or Talk Down To: Not on file    Threaten Physical Harm: Not on file    Scream or Curse: Not on file     Physical Exam   Vitals:   12/13/22 0115 12/13/22 0130  BP: 139/85 133/83  Pulse: 96 92  Resp: 18 13  Temp: (!) 96.7 F (35.9 C) (!) 97.1 F (36.2 C)  SpO2: 100% 100%    CONSTITUTIONAL: Ill-appearing NEURO/PSYCH: Unresponsive, no gag EYES: Pupils minimally responsive ENT/NECK:  no LAD, no JVD CARDIO: Tachycardic rate, well-perfused, normal S1 and S2 PULM: Diffuse rhonchi GI/GU:  non-distended, non-tender MSK/SPINE:  No gross deformities, no edema SKIN:  no rash, atraumatic   *Additional and/or pertinent findings included in MDM below  Diagnostic and Interventional Summary    EKG Interpretation Date/Time:  Wednesday December 13 2022 00:50:18 EDT Ventricular Rate:  96 PR Interval:  144 QRS Duration:  103 QT Interval:  321 QTC Calculation: 406 R Axis:   80  Text Interpretation: Sinus rhythm Consider right atrial enlargement Borderline T abnormalities, inferior leads Confirmed by Kennis Carina [phone]) on 12/13/2022 1:18:04 AM       Labs Reviewed  CBC - Abnormal; Notable for the following components:      Result Value   WBC 18.8 (*)    RBC 3.48 (*)    MCV 114.7 (*)    MCH 37.4 (*)    All other components within normal limits  I-STAT CG4 LACTIC ACID, ED - Abnormal; Notable for the following components:   Lactic Acid, Venous 8.2 (*)    All other components within normal limits  COMPREHENSIVE METABOLIC PANEL  LACTIC ACID, PLASMA  LACTIC ACID, PLASMA  I-STAT ARTERIAL BLOOD GAS, ED  TROPONIN I  (HIGH SENSITIVITY)    DG Abdomen 1 View  Final Result    DG Chest Port 1 View  Final Result    CT HEAD WO CONTRAST ( )    (Results Pending)  CT CERVICAL SPINE WO CONTRAST    (Results Pending)  CT Angio Chest Pulmonary Embolism (PE) W or WO Contrast    (Results Pending)  DG Abdomen 1 View    (Results Pending)    Medications  propofol (DIPRIVAN) 1000 MG/100ML infusion (5 mcg/kg/min  68 kg Intravenous New Bag/Given 12/13/22 0124)  levofloxacin (LEVAQUIN) IVPB 750 mg (has no administration in time range)  sodium chloride 0.9 % bolus 1,000 mL (1,000 mLs Intravenous New Bag/Given 12/13/22 0107)     Procedures  /  Critical Care .Critical Care  Performed by: Sabas Sous, MD Authorized by: Sabas Sous, MD   Critical care provider statement:    Critical care time (minutes):  35   Critical care was necessary to treat or prevent imminent or life-threatening deterioration of the following conditions: Cardiac arrest.   Critical care was time spent personally by me on the following activities:  Development of treatment plan with patient or surrogate, discussions with consultants, evaluation of patient's response to treatment, examination of patient, ordering and review of laboratory studies, ordering and review of radiographic studies, ordering and performing treatments and interventions, pulse oximetry, re-evaluation of patient's condition and review of old charts   ED Course and Medical Decision Making  Initial Impression and Ddx Cardiac arrest at care facility.  Further history obtained from facility.  They do bed checks every 30 minutes.  At 11:30 PM patient was doing fine.  Was unresponsive and blue in the face at midnight.  Facility started CPR.  ROSC obtained by EMS.  Patient has rhonchorous breath sounds and has vomitus in his airway, suspicious for aspiration event.  Currently his hemodynamics are encouraging but he is unresponsive.  Intubated without need for paralytic or  sedative.  Past medical/surgical history that increases complexity of ED encounter: Down syndrome  Interpretation of Diagnostics I personally reviewed the EKG and my interpretation is as follows: Sinus rhythm, right bundle blanch block similar to prior  Labs reveal leukocytosis, significantly elevated lactic acid  Patient Reassessment and Ultimate Disposition/Management     To be admitted to ICU.  Patient management required discussion with the following services or consulting groups:  Intensivist Service  Complexity of Problems Addressed Acute illness or injury that poses threat of life of bodily function  Additional Data Reviewed and Analyzed Further history obtained from: EMS on arrival  Additional Factors Impacting ED Encounter Risk Consideration of hospitalization  Elmer Sow. Pilar Plate, MD  [facility] Emergency Medicine [facility] [email or web address]  Final Clinical Impressions(s) / ED Diagnoses     ICD-10-CM   1. Cardiac arrest [facility])  I46.9       ED Discharge Orders     None        Discharge Instructions Discussed with and Provided to Patient:   Discharge Instructions   None      Sabas Sous, MD 12/13/22 [phone]

## 2022-12-13 NOTE — Progress Notes (Signed)
Checked with RN, pt going to ct. RN requests checkback in 30 miniuts

## 2022-12-13 NOTE — ED Triage Notes (Addendum)
Patient arrives Post CPR. From group home. Patient last seen at 2200.  2 rounds of compressions, 1 epi, no shocks. EMS began CPR at 0013 and ended at 67. Fire had been doing compressions prior to their arrival.  IO LLE 18 Rhand of fluid given

## 2022-12-13 NOTE — Progress Notes (Signed)
Patient seen, examined and chart reviewed  In brief 67 year old male who was admitted status post PEA cardiac arrest overnight, likely in the setting of aspiration pneumonia with prolonged unknown downtime, ROSC was achieved after 6 minutes of CPR  He continued to have myoclonic jerking, confirmed with EEG Remained afebrile     Physical exam: General: Crtitically ill-appearing male, orally intubated HEENT: [place]/AT, eyes anicteric.  ETT and OGT in place Neuro: Sedated, not following commands.  Eyes are closed.  Pupils 3 mm bilateral reactive to light Chest: Coarse breath sounds, no wheezes or rhonchi Heart: Regular rate and rhythm, no murmurs or gallops Abdomen: Soft, nondistended, bowel sounds present Skin: No rash  Labs and images were reviewed  Assessment and plan: Status post out-of-hospital PEA cardiac arrest, likely due to aspiration Probable anoxic brain injury Status myoclonus Prior history of seizure disorder Down syndrome Acute hypoxic respiratory failure Aspiration pneumonia Lactic acidosis Acute kidney injury due to ischemic ATN Anemia of chronic disease  Continue telemetry monitoring Will get continuous EEG Monitor and supplement electrolytes Continue Keppra and propofol infusion Continue nonproductive ventilation VAP prevention bundle in place Pad protocol with propofol and fentanyl Continue IV antibiotics with Levaquin Trend lactate Monitor intake and output Avoid nephrotoxic agent Serum creatinine is improving Monitor H&H   The patient is critically ill due to status post PEA cardiac arrest, acute respiratory failure with hypoxia, aspiration pneumonia.  Critical care was necessary to treat or prevent imminent or life-threatening deterioration.  Critical care was time spent personally by me on the following activities: development of treatment plan with patient and/or surrogate as well as nursing, discussions with consultants, evaluation of patient's  response to treatment, examination of patient, obtaining history from patient or surrogate, ordering and performing treatments and interventions, ordering and review of laboratory studies, ordering and review of radiographic studies, pulse oximetry, re-evaluation of patient's condition and participation in multidisciplinary rounds.   During this encounter critical care time was devoted to patient care services described in this note for 35 minutes.     Cheri Fowler, MD [facility] See Amion for pager If no response to pager, please call [phone] until 7pm After 7pm, Please call E-link [phone]

## 2022-12-13 NOTE — Progress Notes (Signed)
eLink Physician-Brief Progress Note Patient Name: ADALI CALAMIA DOB: 01-Dec-1955 MRN: [id number]   Date of Service  12/13/2022  HPI/Events of Note  67 year old male with a history of Down syndrome, seizure disorder, metabolic syndrome who initially presented to the emergency department with cardiac arrest.  He received 6 minutes of CPR with 1 dose of epinephrine with return of spontaneous circulation.  Intubated in the emergency department.  At presentation, he is tachypneic but otherwise normal vitals.  Placed on a ventilator with FiO2 50% for saturation of 98%.  Metabolic panel consistent with none anion gap metabolic acidosis with hyperglycemia, mild troponin elevation, and improving lactic acidosis.  Leukocytosis present.  CT head, cervical spine, and chest angiography reviewed with evidence of rib fractures and atelectasis.  eICU Interventions  Advance enteral tube 10 cm.  Repeat KUB ordered.  Added morning labs. Trend trop till nadir  Add fentanyl for CPOT Scoring  Home meds pended.  Eeg Pending  GI prophylaxis with pantoprazole, discontinue concurrent famotidine DVT prophylaxis with Lovenox     Intervention Category Evaluation Type: New Patient Evaluation  Shamika Pedregon 12/13/2022, 3:21 AM

## 2022-12-13 NOTE — H&P (Addendum)
NAME:  Travis Hanna, MRN:  [id number], DOB:  [birth year], LOS: 0 ADMISSION DATE:  12/13/2022 CONSULTATION DATE:  12/13/2022 REFERRING MD:  Pilar Plate - EDP CHIEF COMPLAINT:  Cardiac arrest   History of Present Illness:  67 year old man who presented to [facility] ED 8/7 from group home post-cardiac arrest. PMHx significant for HTN, hypothyroidism, seizure disorder, Down syndrome, anemia, OA.  History is obtained primarily from chart review. Per report, bed check was completed around 2330 without cause for concern. Found blue, apneic and pulseless ~midnight. Fire initiated compressions; initial rhythm asystole. EMS began CPR at 0013, completed x 6 minutes (2 rounds of CPR and 1 Epi) with ROSC. Patient was bagged with BVM and intubated on ED arrival without sedation/paralytic. Concern for vomitus/food material in airway on intubation with suspicion for aspiration event +/- seizure.  Labs were notable for WBC 18.8, Hgb 13.0, Plt 296. Na 131, K 4.0, CO2 18, BUN/Cr 14/1.28 (baseline ~0.8), LFTs WNL. Glucose 218. A1c 5.5%. Trop 56 > 137. LA 8.2 > 2.7. ABG 7.425/27.7/260/18.4. CXR unremarkable. CT Head NAICA, stable volume loss/white matter disease, 4mm colloid cyst; CT C-spine without acute fracture or listhesis, advanced multilevel DDD. CTA Chest without PE, mild posterior RUL/BLL atelectasis, multiple rib fractures.   PCCM consulted for ICU admission.  Pertinent Medical History:   Past Medical History:  Diagnosis Date   Anemia    Arthritis    Down syndrome    Frequent falls    Hypertension    now with hypotension   Hypothyroidism    MR (mental retardation)    Myopia with astigmatism    Osteoporosis    Seizure disorder (HCC)    Significant Hospital Events: Including procedures, antibiotic start and stop dates in addition to other pertinent events   8/7 - Presented to ED post-arrest. CPR x 6 mins with EMS with ROSC, Epi x 1. Intubated in ED.  Interim History / Subjective:  PCCM consulted for  admission to ICU.  Objective:  Blood pressure 126/84, pulse 88, temperature (!) 96.3 F (35.7 C), resp. rate (!) 27, height 4\' 7"  (1.397 m), weight 68 kg, SpO2 100%.    Vent Mode: PRVC FiO2 (%):  [50 %-100 %] 50 % Set Rate:  [18 bmp-20 bmp] 18 bmp Vt Set:  [310 mL] 310 mL PEEP:  [5 cmH20] 5 cmH20 Plateau Pressure:  [11 cmH20] 11 cmH20  No intake or output data in the 24 hours ending 12/13/22 0325 Filed Weights   12/13/22 0042  Weight: 68 kg   Physical Examination: General: Acutely ill-appearing middle-aged man in NAD. Appears younger than stated age. HEENT: [place]/AT, anicteric sclera, pupils equal 3mm, sluggish, moist mucous membranes. Brown secretions/debris in mouth. Neuro:  Unresponsive.  Does not respond to verbal, tactile or noxious stimuli. Does not withdraw to pain.Not following commands. No spontaneous movement of extremities noted. +Rhythmic twitching of L eye, jaw, LUE noted. No cough/gag. CV: RRR, no m/g/r. PULM: Breathing even and unlabored on vent (PEEP 6, FiO2 50%). Lung fields CTAB. Thick brown secretions. GI: Soft, nontender, mildly distended. Hypoactive bowel sounds. Gastric tube with brown-red output. Extremities: No  LE edema noted. Skin: Warm/dry, mildly erythematous rash to neck.  Resolved Hospital Problem List:    Assessment & Plan:  Post-cardiac arrest Lactic acidosis - Admit to ICU for close monitoring - Targeted temperature management/normothermia protocol - Goal MAP > 65 - Fluid resuscitation as tolerated - If requiring pressor support, begin Levophed titrated to goal MAP - Trend troponin, LA - F/u Echo  Myoclonus Seizure disorder - Neuro consult pending - LTM EEG - AEDs per Neuro (Keppra), Versed PRN - Propofol gtt (PAD as above) - Seizure precautions - Neuroprotective measures: HOB > 30 degrees, normoglycemia, normothermia, electrolytes WNL - MRI Brain 72-96H post-arrest for neuroprognostication if no improvement in neuro exam  Acute  hypoxemic respiratory failure - Continue full vent support (4-8cc/kg IBW) - Wean FiO2 for O2 sat > 90% - Daily WUA/SBT once appropriate from a mental status standpoint - VAP bundle - Pulmonary hygiene - PAD protocol for sedation: Propofol, Fentanyl, and Versed for goal RASS 0 to -1 - Follow CXR  AKI, likely in the setting of cardiac arrest - Trend BMP - Replete electrolytes as indicated - Monitor I&Os - F/u urine studies - Avoid nephrotoxic agents as able - Ensure adequate renal perfusion  Anemia, chronic macrocytic Bloody GI output - Trend H&H - Monitor for signs of active bleeding - Transfuse for Hgb < 7.0 or hemodynamically significant bleeding - PPI for GI ppx  Down syndrome - Patient has a legal guardian Vidal Schwalbe (niece)  Best Practice: (right click and "Reselect all SmartList Selections" daily)   Diet/type: NPO DVT prophylaxis: SCDs, SQH GI prophylaxis: PPI Lines: N/A Foley:  Yes, and it is still needed Code Status:  full code Last date of multidisciplinary goals of care discussion [Pending]  Labs:  CBC: Recent Labs  Lab 12/13/22 0047 12/13/22 0200 12/13/22 0222  WBC 18.8*  --  19.1*  HGB 13.0 12.6* 13.5  HCT 39.9 37.0* 40.1  MCV 114.7*  --  112.6*  PLT 296  --  293   Basic Metabolic Panel: Recent Labs  Lab 12/13/22 0047 12/13/22 0200 12/13/22 0222  NA 131* 134*  --   K 4.0 3.7  --   CL 98  --   --   CO2 18*  --   --   GLUCOSE 218*  --   --   BUN 14  --   --   CREATININE 1.28*  --  1.06  CALCIUM 8.1*  --   --    GFR: Estimated Creatinine Clearance: 50.1 mL/min (by C-G formula based on SCr of 1.06 mg/dL). Recent Labs  Lab 12/13/22 0047 12/13/22 0120 12/13/22 0222 12/13/22 0257  WBC 18.8*  --  19.1*  --   LATICACIDVEN  --  8.2*  --  2.7*   Liver Function Tests: Recent Labs  Lab 12/13/22 0047  AST 41  ALT 31  ALKPHOS 117  BILITOT 0.4  PROT 6.5  ALBUMIN 2.6*   No results for input(s): "LIPASE", "AMYLASE" in the last 168  hours. No results for input(s): "AMMONIA" in the last 168 hours.  ABG:    Component Value Date/Time   PHART 7.425 12/13/2022 0200   PCO2ART 27.7 (L) 12/13/2022 0200   PO2ART 260 (H) 12/13/2022 0200   HCO3 18.4 (L) 12/13/2022 0200   TCO2 19 (L) 12/13/2022 0200   ACIDBASEDEF 5.0 (H) 12/13/2022 0200   O2SAT 100 12/13/2022 0200    Coagulation Profile: No results for input(s): "INR", "PROTIME" in the last 168 hours.  Cardiac Enzymes: No results for input(s): "CKTOTAL", "CKMB", "CKMBINDEX", "TROPONINI" in the last 168 hours.  HbA1C: Hgb A1c MFr Bld  Date/Time Value Ref Range Status  12/13/2022 02:22 AM 5.5 4.8 - 5.6 % Final    Comment:    (NOTE) Pre diabetes:          5.7%-6.4%  Diabetes:              >  6.4%  Glycemic control for   <7.0% adults with diabetes   08/24/2016 08:09 AM 5.0 4.8 - 5.6 % Final    Comment:    (NOTE)         Pre-diabetes: 5.7 - 6.4         Diabetes: >6.4         Glycemic control for adults with diabetes: <7.0    CBG: No results for input(s): "GLUCAP" in the last 168 hours.  Review of Systems:   Patient is encephalopathic and/or intubated; therefore, history has been obtained from chart review.   Past Medical History:  He,  has a past medical history of Anemia, Arthritis, Down syndrome, Frequent falls, Hypertension, Hypothyroidism, MR (mental retardation), Myopia with astigmatism, Osteoporosis, and Seizure disorder (HCC).   Surgical History:   Past Surgical History:  Procedure Laterality Date   CLOSED REDUCTION NASAL FRACTURE N/A 02/18/2019   Procedure: CLOSED REDUCTION NASAL FRACTURE;  Surgeon: Glenna Fellows, MD;  Location: [facility] OR;  Service: Plastics;  Laterality: N/A;    Social History:   reports that he has never smoked. He has never used smokeless tobacco. He reports that he does not drink alcohol and does not use drugs.   Family History:  His Family history is unknown by patient.   Allergies: Allergies  Allergen Reactions    Carbapenems Other (See Comments)    unknown   Cephalosporins Other (See Comments)    unknown   Other Other (See Comments)    Allergic to Betalactams per group home-unknown reaction    Penicillamine Other (See Comments)    unknown   Penicillins Other (See Comments)    unknown    Home Medications: Prior to Admission medications   Medication Sig Start Date End Date Taking? Authorizing Provider  alum & mag hydroxide-simeth (MAALOX PLUS) 400-400-40 MG/5ML suspension Take 15-30 mLs by mouth See admin instructions. 15ml (if patient <100lbs) or 30ml (if pt is >100lbs) every 2 hours as needed for indigestion/heartburn/upset stomach   Yes [provider]  Artificial Tears ophthalmic solution Place 1 drop into both eyes in the morning, at noon, and at bedtime.   Yes [provider]  bisacodyl (DULCOLAX) 10 MG suppository Place 10 mg rectally daily as needed (constipation if no result from MOM).   Yes [provider]  diphenhydrAMINE (BENADRYL) 12.5 MG/5ML liquid Take 25 mg by mouth 4 (four) times daily as needed for allergies or itching (nasal congestion).   Yes [provider]  feeding supplement, ENSURE ENLIVE, (ENSURE ENLIVE) LIQD Take 237 mLs by mouth 2 (two) times daily between meals. Patient taking differently: Take 237 mLs by mouth in the morning and at bedtime. 06/16/19  Yes Rodolph Bong, MD  guaiFENesin (ROBITUSSIN) 100 MG/5ML liquid Take 300 mg by mouth every 4 (four) hours as needed for cough.   Yes [provider]  ketoconazole (NIZORAL) 2 % shampoo Apply 1 application topically every Monday, Wednesday, and Friday.    Yes [provider]  lactulose (CHRONULAC) 10 GM/15ML solution Take 13.3 g by mouth daily.    Yes [provider]  LINZESS 145 MCG CAPS capsule Take 145 mcg by mouth daily. 07/09/19  Yes [provider]  loperamide (IMODIUM A-D) 2 MG tablet Take 2-4 mg by mouth See admin instructions. Take 4mg  by mouth  for initial dose, then take 2mg  for each loose stool afterwards.   Yes [provider]  metroNIDAZOLE (METROGEL) 1 % gel Apply 1 application topically every evening. Apply  to face   Yes [provider]  olopatadine (PATANOL) 0.1 % ophthalmic solution Place 1 drop into both eyes daily.   Yes [provider]  promethazine (PHENERGAN) 25 MG tablet Take 25 mg by mouth See admin instructions. 25mg  every 4 to 6 hours as needed for vomiting   Yes [provider]  Soap & Cleansers (PHISODERM NORMAL TO OILY EX) Apply 1 application topically daily. To face   Yes [provider]  Vitamins A & D (VITAMIN A & D) ointment Apply 1 application  topically 3 (three) times daily as needed (skin irritation/rash/chapped skin). Also place on buttocks and peri area with bowel hygiene   Yes [provider]  acetaminophen (TYLENOL) 160 MG/5ML liquid Take 15.6 mLs (500 mg total) by mouth every 6 (six) hours as needed for pain. Patient taking differently: Take 640 mg by mouth every 4 (four) hours as needed for pain or fever. 05/05/21   Terald Sleeper, MD  acetaminophen (TYLENOL) 325 MG tablet Take 650 mg by mouth every 4 (four) hours as needed for fever or mild pain.    [provider]  alendronate (FOSAMAX) 70 MG tablet Take 70 mg by mouth every Friday. Take with a full glass of water on an empty stomach.    [provider]  aspirin EC 81 MG EC tablet Take 1 tablet (81 mg total) by mouth daily. 08/26/16   Leatha Gilding, MD  Brompheniramine-Pseudoeph (DIMETAPP PO) Take 10-20 mLs by mouth See admin instructions. 10ml (if <100lbs) to 20ml (if >100lbs) every 4 hours as needed for cold symptoms    [provider]  Calcium Carbonate-Vitamin D 600-400 MG-UNIT tablet Take 1 tablet by mouth 2 (two) times daily.    [provider]  carbamazepine (TEGRETOL) 100 MG chewable tablet Chew 200 mg by mouth 2 (two) times daily.  01/07/19   [provider]  carbamide peroxide (DEBROX) 6.5 % OTIC solution Place 5 drops into both ears 2 (two) times daily as needed (ear wax removal).    [provider]  cetaphil (CETAPHIL) lotion Apply 1 application topically daily. Apply topically to face    [provider]  Cholecalciferol (VITAMIN D) 50 MCG (2000 UT) tablet Take 2,000 Units by mouth daily.    [provider]  Diethyltoluamide (OFF ACTIVE) 15 % AERO Apply 1 application. topically daily as needed (insect bite prevention).    [provider]  diphenhydrAMINE (BENADRYL) 25 MG tablet Take 25 mg by mouth every 4 (four) hours as needed for allergies or itching (nasal congestion).    [provider]  esomeprazole (NEXIUM) 40 MG capsule Take 40 mg by mouth at bedtime.    [provider]  Glycerin-Hypromellose-PEG 400 (VISINE TEARS) 0.2-0.36-1 % SOLN Place 1 drop into both eyes 3 (three) times daily.    [provider]  hydrocortisone cream 1 % Apply 1 application. topically 2 (two) times daily as needed for itching (bug bites).    [provider]  ibuprofen (ADVIL) 200 MG tablet Take 200-600 mg by mouth every 8 (eight) hours as needed for mild pain (discomfort).    [provider]  levothyroxine (SYNTHROID) 50 MCG tablet Take 50 mcg by mouth daily.    [provider]  neomycin-bacitracin-polymyxin (NEOSPORIN) [phone] ointment Apply 1 application. topically 2 (two) times daily as needed (abrasions and superficial lacerations).    [provider]  Skin Protectants, Misc. (CHAPSTICK) STCK Apply 1 each topically daily as needed (to  prevent sunburn).    [provider]  Sunscreens (SUNSCREEN ULTRA SHEER EX) Apply 1 application. topically daily as needed (prevent sunburn).    [provider]    Critical care time:    The patient is critically ill with multiple organ system failure and requires high complexity decision making for  assessment and support, frequent evaluation and titration of therapies, advanced monitoring, review of radiographic studies and interpretation of complex data.   Critical Care Time devoted to patient care services, exclusive of separately billable procedures, described in this note is 39 minutes.  Tim Lair, PA-C [facility] 12/13/22 3:25 AM  Please see [email or web address] for pager details.  From 7A-7P if no response, please call [phone] After hours, please call ELink [phone]

## 2022-12-13 NOTE — ED Notes (Signed)
Called legal guardian, Vidal Schwalbe. No answer.

## 2022-12-13 NOTE — ED Notes (Signed)
Back from CT

## 2022-12-13 NOTE — ED Notes (Signed)
Attempt at intubation, 25@ lips, positive color change, bilateral breath sounds.

## 2022-12-13 NOTE — Procedures (Signed)
Arterial Catheter Insertion Procedure Note  JORGEN MERRITT  [id number]  11-03-1955  Date:12/13/22  Time:6:27 AM    Provider Performing: Tim Lair    Procedure: Insertion of Arterial Line (38756) with US guidance (43329)   Indication(s) Blood pressure monitoring and/or need for frequent ABGs  Consent Unable to obtain consent due to emergent nature of procedure.  Anesthesia None   Time Out Verified patient identification, verified procedure, site/side was marked, verified correct patient position, special equipment/implants available, medications/allergies/relevant history reviewed, required imaging and test results available.   Sterile Technique Maximal sterile technique including full sterile barrier drape, hand hygiene, sterile gown, sterile gloves, mask, hair covering, sterile ultrasound probe cover (if used).   Procedure Description Area of catheter insertion was cleaned with chlorhexidine and draped in sterile fashion. With real-time ultrasound guidance an arterial catheter was placed into the right radial artery.  Appropriate arterial tracings confirmed on monitor.     Complications/Tolerance None; patient tolerated the procedure well.   EBL Minimal   Specimen(s) None  Tim Lair, [place] [facility] 12/13/22 6:27 AM  Please see [email or web address] for pager details.  From 7A-7P if no response, please call [phone] After hours, please call ELink [phone]

## 2022-12-13 NOTE — Progress Notes (Signed)
A nurse from patient's group home called for an update, specifically asking why the patient had been diagnosed with aspiration. This RN could not verify who he was over the phone, and explained that to him. RN informed the other RN to call the patient's legal guardian for updates regarding patient's condition.

## 2022-12-13 NOTE — IPAL (Signed)
  Interdisciplinary Goals of Care Family Meeting   Date carried out: 12/13/2022  Location of the meeting: Bedside  Member's involved: Physician, Bedside Registered Nurse, Social Worker, and Family Member or next of kin  Durable Power of Attorney or acting medical decision maker: Travis Hanna  Discussion: We discussed goals of care for Travis Hanna .   Patient's condition was updated to patient's niece and DPOA at bedside, updated that he had unknown downtime, he may have suffered brain injury which is suggested by myoclonus after cardiac arrest.  I explained patient's DPOA that we will continue supportive care and we will try to get the MRI 48 hours postarrest to see the extent of brain injury. Patient's DPOA stated she would like to keep him DNR and continue full scope of care until we know more about his brain injury  Code status:   Code Status: DNR   Disposition: Continue current acute care      Cheri Fowler, MD [facility] See Amion for pager If no response to pager, please call [phone] until 7pm After 7pm, Please call E-link [phone]

## 2022-12-13 NOTE — ED Provider Notes (Signed)
Procedure Name: Intubation Date/Time: 12/13/2022 1:21 AM  Performed by: Lenard Simmer, PA-CPre-anesthesia Checklist: Patient identified, Emergency Drugs available, Suction available, Timeout performed and Patient being monitored Oxygen Delivery Method: Ambu bag Preoxygenation: Pre-oxygenation with 100% oxygen Laryngoscope Size: Mac, 3 and Glidescope Grade View: Grade I Tube size: 7.5 mm Number of attempts: 1 Airway Equipment and Method: Rigid stylet and Video-laryngoscopy Placement Confirmation: ETT inserted through vocal cords under direct vision, Positive ETCO2, CO2 detector and Breath sounds checked- equal and bilateral Secured at: 25 cm Tube secured with: ETT holder Comments: Post CPR, ROSC patient; IGEL was in place upon arrival, able to ventilate patient with Ambu bag and IGEL without difficulty         Lenard Simmer, PA-C 12/13/22 0138    Sabas Sous, MD 12/13/22 [phone]

## 2022-12-13 NOTE — Progress Notes (Signed)
Pt being transferred to 2h, eeg to be attempted in room

## 2022-12-14 DIAGNOSIS — R569 Unspecified convulsions: Secondary | ICD-10-CM | POA: Diagnosis not present

## 2022-12-14 DIAGNOSIS — I469 Cardiac arrest, cause unspecified: Secondary | ICD-10-CM | POA: Diagnosis not present

## 2022-12-14 DIAGNOSIS — J9601 Acute respiratory failure with hypoxia: Secondary | ICD-10-CM | POA: Diagnosis not present

## 2022-12-14 LAB — FOLATE: Folate: 7.5 ng/mL (ref >5.9)

## 2022-12-14 LAB — VITAMIN B12: Vitamin B-12: 199 pg/mL (ref 180–914)

## 2022-12-14 LAB — GLUCOSE, CAPILLARY
Glucose-Capillary: 102 mg/dL — ABNORMAL HIGH (ref 70–99)
Glucose-Capillary: 102 mg/dL — ABNORMAL HIGH (ref 70–99)
Glucose-Capillary: 105 mg/dL — ABNORMAL HIGH (ref 70–99)
Glucose-Capillary: 108 mg/dL — ABNORMAL HIGH (ref 70–99)
Glucose-Capillary: 112 mg/dL — ABNORMAL HIGH (ref 70–99)
Glucose-Capillary: 81 mg/dL (ref 70–99)

## 2022-12-14 MED ORDER — ACETAMINOPHEN 325 MG PO TABS: 650.0000 mg | ORAL_TABLET | ORAL | Status: AC

## 2022-12-14 MED ORDER — ACETAMINOPHEN 650 MG RE SUPP: 650.0000 mg | RECTAL | Status: AC

## 2022-12-14 MED ORDER — LEVOFLOXACIN IN D5W 750 MG/150ML IV SOLN
750.0000 mg | INTRAVENOUS | Status: DC
  Administered 2022-12-14 – 2022-12-15 (×2): 750 mg via INTRAVENOUS
  Filled 2022-12-14 (×3): qty 150

## 2022-12-14 MED ORDER — ACETAMINOPHEN 160 MG/5ML PO SOLN
650.0000 mg | ORAL | Status: AC
  Administered 2022-12-14 (×2): 650 mg
  Filled 2022-12-14 (×2): qty 20.3

## 2022-12-14 NOTE — Plan of Care (Signed)
  Problem: Education: Goal: Knowledge of General Education information will improve Description: Including pain rating scale, medication(s)/side effects and non-pharmacologic comfort measures Outcome: Progressing   Problem: Health Behavior/Discharge Planning: Goal: Ability to manage health-related needs will improve Outcome: Progressing   Problem: Clinical Measurements: Goal: Ability to maintain clinical measurements within normal limits will improve Outcome: Progressing Goal: Will remain free from infection Outcome: Progressing Goal: Diagnostic test results will improve Outcome: Progressing Goal: Respiratory complications will improve Outcome: Progressing Goal: Cardiovascular complication will be avoided Outcome: Progressing   Problem: Activity: Goal: Risk for activity intolerance will decrease Outcome: Progressing   Problem: Nutrition: Goal: Adequate nutrition will be maintained Outcome: Progressing   Problem: Coping: Goal: Level of anxiety will decrease Outcome: Progressing   Problem: Elimination: Goal: Will not experience complications related to bowel motility Outcome: Progressing Goal: Will not experience complications related to urinary retention Outcome: Progressing   Problem: Pain Managment: Goal: General experience of comfort will improve Outcome: Progressing   Problem: Safety: Goal: Ability to remain free from injury will improve Outcome: Progressing   Problem: Skin Integrity: Goal: Risk for impaired skin integrity will decrease Outcome: Progressing   Problem: Education: Goal: Ability to describe self-care measures that may prevent or decrease complications (Diabetes Survival Skills Education) will improve Outcome: Progressing Goal: Individualized Educational Video(s) Outcome: Progressing   Problem: Coping: Goal: Ability to adjust to condition or change in health will improve Outcome: Progressing   Problem: Fluid Volume: Goal: Ability to  maintain a balanced intake and output will improve Outcome: Progressing   Problem: Health Behavior/Discharge Planning: Goal: Ability to identify and utilize available resources and services will improve Outcome: Progressing Goal: Ability to manage health-related needs will improve Outcome: Progressing   Problem: Metabolic: Goal: Ability to maintain appropriate glucose levels will improve Outcome: Progressing   Problem: Nutritional: Goal: Maintenance of adequate nutrition will improve Outcome: Progressing Goal: Progress toward achieving an optimal weight will improve Outcome: Progressing   Problem: Skin Integrity: Goal: Risk for impaired skin integrity will decrease Outcome: Progressing   Problem: Tissue Perfusion: Goal: Adequacy of tissue perfusion will improve Outcome: Progressing   Problem: Education: Goal: Ability to manage disease process will improve Outcome: Progressing   Problem: Cardiac: Goal: Ability to achieve and maintain adequate cardiopulmonary perfusion will improve Outcome: Progressing   Problem: Neurologic: Goal: Promote progressive neurologic recovery Outcome: Progressing   Problem: Skin Integrity: Goal: Risk for impaired skin integrity will be minimized. Outcome: Progressing

## 2022-12-14 NOTE — Progress Notes (Signed)
LTM maint complete - no skin breakdown under:  FP1,f8

## 2022-12-14 NOTE — Progress Notes (Signed)
NAME:  Travis Hanna, MRN:  [id number], DOB:  03/27/1956, LOS: 1 ADMISSION DATE:  12/13/2022 CONSULTATION DATE:  12/13/2022 REFERRING MD:  Pilar Plate - EDP CHIEF COMPLAINT:  Cardiac arrest   History of Present Illness:  67 year old man who presented to [facility] ED 8/7 from group home post-cardiac arrest. PMHx significant for HTN, hypothyroidism, seizure disorder, Down syndrome, anemia, OA.  History is obtained primarily from chart review. Per report, bed check was completed around 2330 without cause for concern. Found blue, apneic and pulseless ~midnight. Fire initiated compressions; initial rhythm asystole. EMS began CPR at 0013, completed x 6 minutes (2 rounds of CPR and 1 Epi) with ROSC. Patient was bagged with BVM and intubated on ED arrival without sedation/paralytic. Concern for vomitus/food material in airway on intubation with suspicion for aspiration event +/- seizure.  Labs were notable for WBC 18.8, Hgb 13.0, Plt 296. Na 131, K 4.0, CO2 18, BUN/Cr 14/1.28 (baseline ~0.8), LFTs WNL. Glucose 218. A1c 5.5%. Trop 56 > 137. LA 8.2 > 2.7. ABG 7.425/27.7/260/18.4. CXR unremarkable. CT Head NAICA, stable volume loss/white matter disease, 4mm colloid cyst; CT C-spine without acute fracture or listhesis, advanced multilevel DDD. CTA Chest without PE, mild posterior RUL/BLL atelectasis, multiple rib fractures.   PCCM consulted for ICU admission.  Pertinent Medical History:   Past Medical History:  Diagnosis Date   Anemia    Arthritis    Down syndrome    Frequent falls    Hypertension    now with hypotension   Hypothyroidism    MR (mental retardation)    Myopia with astigmatism    Osteoporosis    Seizure disorder (HCC)    Significant Hospital Events: Including procedures, antibiotic start and stop dates in addition to other pertinent events   8/7 - Presented to ED post-arrest. CPR x 6 mins with EMS with ROSC, Epi x 1. Intubated in ED.  Interim History / Subjective:  No overnight  issues Remain afebrile Came off of vasopressor support White count is trending down  Objective:  Blood pressure 106/72, pulse (!) 54, temperature (!) 97.2 F (36.2 C), resp. rate (!) 23, height 4\' 7"  (1.397 m), weight 57.9 kg, SpO2 100%.    Vent Mode: PRVC FiO2 (%):  [40 %-50 %] 40 % Set Rate:  [18 bmp] 18 bmp Vt Set:  [310 mL] 310 mL PEEP:  [5 cmH20] 5 cmH20 Plateau Pressure:  [10 cmH20-12 cmH20] 10 cmH20   Intake/Output Summary (Last 24 hours) at 12/14/2022 0811 Last data filed at 12/14/2022 0700 Gross per 24 hour  Intake 1069.93 ml  Output 765 ml  Net 304.93 ml   Filed Weights   12/13/22 0042 12/13/22 0330 12/14/22 0330  Weight: 68 kg 57.7 kg 57.9 kg   Physical Examination:   Physical exam: General: Crtitically ill-appearing male, orally intubated HEENT: [place]/AT, eyes anicteric.  ETT and OGT in place Neuro: Sedated, not following commands.  Eyes are closed.  Pupils 3 mm bilateral reactive to light.  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 Continue daily monitoring Continue TTM for 48 hours Will get MRI brain tomorrow Echocardiogram showed no wall motion abnormalities with normal EF  Seizure disorder Patient with seizure disorder at baseline Continue propofol LTM is on, reading is pending Continue Keppra Continue seizure precautions  Acute hypoxemic respiratory failure Aspiration pneumonia Continue protective ventilation FiO2 and PEEP was titrated down to 40% 5 respectively VAP prevention bundle in place Pad protocol with propofol and as needed fentanyl, RASS goal  -1 Follow-up respiratory culture Continue IV antibiotics  AKI, due to ATN in the setting of cardiac arrest Lactic acidosis, resolved Serum creatinine has trended down, back to normal Monitor intake and output Avoid nephrotoxic agent  Anemia, chronic macrocytic Bloody GI output Check B12 and folate Monitor H&H and transfuse if less than 7 Continue Protonix  Down syndrome Patient has a legal guardian Vidal Schwalbe (niece)  Best Practice: (right click and "Reselect all SmartList Selections" daily)   Diet/type: NPO DVT prophylaxis: Enoxaparin GI prophylaxis: PPI Lines: N/A Foley:  Yes, and it is still needed Code Status:  full code Last date of multidisciplinary goals of care discussion [8/7: Patient legal guardian was updated at bedside, decision to make him DNR and continue full scope of care]  Labs:  CBC: Recent Labs  Lab 12/13/22 0047 12/13/22 0200 12/13/22 0222 12/13/22 0430 12/14/22 0418  WBC 18.8*  --  19.1* 17.8* 9.1  HGB 13.0 12.6* 13.5 13.6 11.2*  HCT 39.9 37.0* 40.1 39.2 33.2*  MCV 114.7*  --  112.6* 107.4* 109.2*  PLT 296  --  293 279 222   Basic Metabolic Panel: Recent Labs  Lab 12/13/22 0047 12/13/22 0200 12/13/22 0222 12/13/22 0507 12/13/22 0555 12/14/22 0418  NA 131* 134*  --   --  131* 131*  K 4.0 3.7  --   --  4.2 4.2  CL 98  --   --   --  103 103  CO2 18*  --   --   --  17* 22  GLUCOSE 218*  --   --   --  165* 121*  BUN 14  --   --   --  14 14  CREATININE 1.28*  --  1.06  --  0.98 0.66  CALCIUM 8.1*  --   --   --  7.8* 7.3*  MG  --   --   --  1.9 1.9 2.2  PHOS  --   --   --  2.9  --  2.5   GFR: Estimated Creatinine Clearance: 61.1 mL/min (by C-G formula based on SCr of 0.66 mg/dL). Recent Labs  Lab 12/13/22 0047 12/13/22 0120 12/13/22 0222 12/13/22 0257 12/13/22 0430 12/13/22 0507 12/13/22 1014 12/14/22 0418  WBC 18.8*  --  19.1*  --  17.8*  --   --  9.1  LATICACIDVEN  --  8.2*  --  2.7*  --  3.1* 1.6  --    Liver  Function Tests: Recent Labs  Lab 12/13/22 0047 12/13/22 0555 12/14/22 0418  AST 41 66* 53*  ALT 31 34 27  ALKPHOS 117 87 64  BILITOT 0.4 0.5 0.8  PROT 6.5 6.4* 5.7*  ALBUMIN 2.6* 2.6* 2.4*   No results for input(s): "LIPASE", "AMYLASE" in the last 168 hours. No results for input(s): "AMMONIA" in the last 168 hours.  ABG:    Component Value Date/Time   PHART 7.425 12/13/2022 0200   PCO2ART 27.7 (L) 12/13/2022 0200   PO2ART 260 (H) 12/13/2022 0200   HCO3 18.4 (L) 12/13/2022 0200   TCO2 19 (L) 12/13/2022 0200   ACIDBASEDEF 5.0 (H) 12/13/2022 0200   O2SAT 100 12/13/2022  0200    Coagulation Profile: No results for input(s): "INR", "PROTIME" in the last 168 hours.  Cardiac Enzymes: No results for input(s): "CKTOTAL", "CKMB", "CKMBINDEX", "TROPONINI" in the last 168 hours.  HbA1C: Hgb A1c MFr Bld  Date/Time Value Ref Range Status  12/13/2022 02:22 AM 5.5 4.8 - 5.6 % Final    Comment:    (NOTE) Pre diabetes:          5.7%-6.4%  Diabetes:              >6.4%  Glycemic control for   <7.0% adults with diabetes   08/24/2016 08:09 AM 5.0 4.8 - 5.6 % Final    Comment:    (NOTE)         Pre-diabetes: 5.7 - 6.4         Diabetes: >6.4         Glycemic control for adults with diabetes: <7.0    CBG: Recent Labs  Lab 12/13/22 1557 12/13/22 2001 12/14/22 0032 12/14/22 0353 12/14/22 0753  GLUCAP 134* 115* 102* 108* 112*    The patient is critically ill due to status post PEA cardiac arrest/aspiration pneumonia.  Critical care was necessary to treat or prevent imminent or life-threatening deterioration.  Critical care was time spent personally by me on the following activities: development of treatment plan with patient and/or surrogate as well as nursing, discussions with consultants, evaluation of patient's response to treatment, examination of patient, obtaining history from patient or surrogate, ordering and performing treatments and interventions, ordering and review of  laboratory studies, ordering and review of radiographic studies, pulse oximetry, re-evaluation of patient's condition and participation in multidisciplinary rounds.   During this encounter critical care time was devoted to patient care services described in this note for 37 minutes.     Cheri Fowler, MD [facility] See Amion for pager If no response to pager, please call [phone] until 7pm After 7pm, Please call E-link [phone]

## 2022-12-14 NOTE — Procedures (Signed)
Patient Name: Travis Hanna  MRN: [id number]  Epilepsy Attending: Charlsie Quest  Referring Physician/Provider: Cheri Fowler, MD  Duration: 12/13/2022 0930 to 12/14/2022 0930  Patient history:  67 year old man with down syndrome, seizure disorder, s/p cardiac arrest. EEG to evaluate for seizure.    Level of alertness: comatose  AEDs during EEG study: LEV, Propofol  Technical aspects: This EEG study was done with scalp electrodes positioned according to the 10-20 International system of electrode placement. Electrical activity was reviewed with band pass filter of 1-70Hz , sensitivity of 7 uV/mm, display speed of 51mm/sec with a 60Hz  notched filter applied as appropriate. EEG data were recorded continuously and digitally stored. Video monitoring was available and reviewed as appropriate.  Description: Patient was noted to have episodes of brief sudden eye opening with whole body jerking every few 15-20 seconds. Concomitant EEG showed generalized polyspikes consistent with myoclonic seizures. In between seizures EEG showed generalized background suppression. Hyperventilation and photic stimulation were not performed.     ABNORMALITY - Myoclonic seizure, generalized - Background suppression, generalized  IMPRESSION: At the beginning of study, patient was noted to have myoclonic seizures every 15-20 seconds. Additionally there was evidence of profound diffuse encephalopathy.  In the setting of cardiac arrest, this EEG pattern is suggestive of anoxic/hypoxic brain injury.  Gerrianne Aydelott Annabelle Harman

## 2022-12-15 ENCOUNTER — Inpatient Hospital Stay (HOSPITAL_COMMUNITY): Payer: MEDICARE

## 2022-12-15 DIAGNOSIS — R569 Unspecified convulsions: Secondary | ICD-10-CM | POA: Diagnosis not present

## 2022-12-15 DIAGNOSIS — J9601 Acute respiratory failure with hypoxia: Secondary | ICD-10-CM | POA: Diagnosis not present

## 2022-12-15 DIAGNOSIS — I469 Cardiac arrest, cause unspecified: Secondary | ICD-10-CM | POA: Diagnosis not present

## 2022-12-15 LAB — GLUCOSE, CAPILLARY
Glucose-Capillary: 156 mg/dL — ABNORMAL HIGH (ref 70–99)
Glucose-Capillary: 65 mg/dL — ABNORMAL LOW (ref 70–99)
Glucose-Capillary: 66 mg/dL — ABNORMAL LOW (ref 70–99)
Glucose-Capillary: 74 mg/dL (ref 70–99)
Glucose-Capillary: 85 mg/dL (ref 70–99)
Glucose-Capillary: 86 mg/dL (ref 70–99)
Glucose-Capillary: 89 mg/dL (ref 70–99)
Glucose-Capillary: 92 mg/dL (ref 70–99)
Glucose-Capillary: 97 mg/dL (ref 70–99)

## 2022-12-15 MED ORDER — DEXTROSE 50 % IV SOLN
12.5000 g | INTRAVENOUS | Status: AC
  Administered 2022-12-15: 12.5 g via INTRAVENOUS

## 2022-12-15 MED ORDER — DEXTROSE 50 % IV SOLN
12.5000 g | Freq: Once | INTRAVENOUS | Status: AC
  Administered 2022-12-15: 12.5 g via INTRAVENOUS
  Filled 2022-12-15: qty 50

## 2022-12-15 MED ORDER — POTASSIUM CHLORIDE 20 MEQ PO PACK
20.0000 meq | PACK | Freq: Once | ORAL | Status: AC
  Administered 2022-12-15: 20 meq
  Filled 2022-12-15: qty 1

## 2022-12-15 MED ORDER — MAGNESIUM SULFATE 2 GM/50ML IV SOLN
2.0000 g | Freq: Once | INTRAVENOUS | Status: AC
  Administered 2022-12-15: 2 g via INTRAVENOUS
  Filled 2022-12-15: qty 50

## 2022-12-15 MED ORDER — DEXTROSE 50 % IV SOLN
INTRAVENOUS | Status: AC
  Filled 2022-12-15: qty 50

## 2022-12-15 MED ORDER — POTASSIUM PHOSPHATES 15 MMOLE/5ML IV SOLN
15.0000 mmol | Freq: Once | INTRAVENOUS | Status: AC
  Administered 2022-12-15: 15 mmol via INTRAVENOUS
  Filled 2022-12-15: qty 5

## 2022-12-15 NOTE — Progress Notes (Signed)
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  Updated patient's family, they would like to proceed with one-way extubation by tomorrow at 10:00.  All questions were answered    Cheri Fowler, MD [facility] See Amion for pager If no response to pager, please call [phone] until 7pm After 7pm, Please call E-link [phone]

## 2022-12-15 NOTE — Progress Notes (Signed)
LTM EEG discontinued - no skin breakdown at unhook.   

## 2022-12-15 NOTE — Progress Notes (Signed)
Ventilator patient transported from 2H13 to MRI and back without any complications.

## 2022-12-15 NOTE — TOC Initial Note (Signed)
Transition of Care [facility]) - Initial/Assessment Note    Patient Details  Name: Travis Hanna MRN: [id number] Date of Birth: Feb 27, 1956  [facility] CM/SW Contact:    Elliot Cousin, RN Phone Number: [phone] 12/15/2022, 4:23 PM  Clinical Narrative:    CM spoke to pt's brother, Windy Fast and SIL at bedside. Pt is currently living at a group home. He thinks pt will need more LTC at facility. States his dtr-Heather [phone] will be his legal guardian starting Sept. Will continue to follow for dc needs.                Expected Discharge Plan: Skilled Nursing Facility Barriers to Discharge: Continued Medical Work up   Patient Goals and CMS Choice Patient states their goals for this hospitalization and ongoing recovery are:: patient may need SNF LTC CMS Medicare.gov Compare Post Acute Care list provided to:: Patient Represenative (must comment) (legal guardian- [name])        Expected Discharge Plan and Services   Discharge Planning Services: CM Consult Post Acute Care Choice: Skilled Nursing Facility Living arrangements for the past 2 months: Group Home                                      Prior Living Arrangements/Services Living arrangements for the past 2 months: Group Home Lives with:: Facility Resident Patient language and need for interpreter reviewed:: Yes        Need for Family Participation in Patient Care: Yes (Comment) Care giver support system in place?: Yes (comment)      Activities of Daily Living   ADL Screening (condition at time of admission) Patient's cognitive ability adequate to safely complete daily activities?: No Patient able to express need for assistance with ADLs?: No Independently performs ADLs?: No  Permission Sought/Granted Permission sought to share information with : Case Manager, Family Supports Permission granted to share information with : Yes, Verbal Permission Granted  Share Information with  NAME: Maccoy Canida     Permission granted to share info w Relationship: brother/legal guardian  Permission granted to share info w Contact Information: [phone]  Emotional Assessment   Attitude/Demeanor/Rapport: Intubated (Following Commands or Not Following Commands)          Admission diagnosis:  Cardiac arrest [facility]) [I46.9] Patient Active Problem List   Diagnosis Date Noted   Cardiac arrest (HCC) 12/13/2022   Anemia 09/01/2019   Cirrhosis (HCC) 09/01/2019   Hypernatremia 06/11/2019   Nonspecific abnormal electrocardiogram (ECG) (EKG) 01/23/2019   Frequent falls 01/23/2019   Hypertension 01/23/2019   Bradycardia 01/23/2019   CVA (cerebral vascular accident) (HCC) 08/25/2016   Right leg weakness 08/23/2016   Hypothyroidism 06/26/2013   Ileus (HCC) 06/25/2013   Abdominal pain, unspecified site 06/25/2013   Down syndrome, unspecified 06/25/2013   Constipation 06/25/2013   PCP:  Lucretia Field Pharmacy:   [id number] [place], [place] - [address] [place] [address] Phone: [phone] Fax: [phone]     Social Determinants of Health (SDOH) Social History: SDOH Screenings   Food Insecurity: Patient Unable To Answer (12/13/2022)  Housing: Patient Unable To Answer (12/13/2022)  Transportation Needs: Patient Unable To Answer (12/13/2022)  Utilities: Patient Unable To Answer (12/13/2022)  Social Connections: Unknown (05/05/2022)   Received from [facility]  Tobacco Use: Low Risk  (12/13/2022)   SDOH Interventions:  Readmission Risk Interventions     No data to display

## 2022-12-15 NOTE — Procedures (Signed)
Patient Name: Travis Hanna  MRN: [id number]  Epilepsy Attending: Charlsie Quest  Referring Physician/Provider: Cheri Fowler, MD  Duration: 12/15/2022 0930 to 12/15/2022 1412   Patient history:  67 year old man with down syndrome, seizure disorder, s/p cardiac arrest. EEG to evaluate for seizure.     Level of alertness: comatose   AEDs during EEG study: LEV, Propofol   Technical aspects: This EEG study was done with scalp electrodes positioned according to the 10-20 International system of electrode placement. Electrical activity was reviewed with band pass filter of 1-70Hz , sensitivity of 7 uV/mm, display speed of 79mm/sec with a 60Hz  notched filter applied as appropriate. EEG data were recorded continuously and digitally stored. Video monitoring was available and reviewed as appropriate.   Description: This study showed generalized periodic epileptiform discharges at 0.25 to 1 Hz admixed with generalized background suppression. Hyperventilation and photic stimulation were not performed.      ABNORMALITY - Periodic epileptiform discharges, generalized - Background suppression, generalized   IMPRESSION: This study showed evidence of epileptogenicity with generalized onset as well as profound diffuse encephalopathy. In the setting of cardiac arrest, this EEG pattern is suggestive of anoxic/hypoxic brain injury.   [name]

## 2022-12-15 NOTE — Progress Notes (Signed)
Hypoglycemic Event  CBG: 66  Treatment: D50 25 mL (12.5 gm)  Symptoms: None  Follow-up CBG: Time:1709 CBG Result:86  Possible Reasons for Event: Inadequate meal intake  Comments/MD notified: Cheri Fowler, MD; no new orders    Travis Hanna M Kaven [place]

## 2022-12-15 NOTE — Plan of Care (Signed)
  Problem: Education: Goal: Knowledge of General Education information will improve Description: Including pain rating scale, medication(s)/side effects and non-pharmacologic comfort measures Outcome: Not Progressing   Problem: Health Behavior/Discharge Planning: Goal: Ability to manage health-related needs will improve Outcome: Not Progressing   Problem: Clinical Measurements: Goal: Ability to maintain clinical measurements within normal limits will improve Outcome: Not Progressing Goal: Will remain free from infection Outcome: Not Progressing Goal: Diagnostic test results will improve Outcome: Not Progressing Goal: Respiratory complications will improve Outcome: Not Progressing Goal: Cardiovascular complication will be avoided Outcome: Not Progressing   Problem: Activity: Goal: Risk for activity intolerance will decrease Outcome: Not Progressing   Problem: Nutrition: Goal: Adequate nutrition will be maintained Outcome: Not Progressing   Problem: Coping: Goal: Level of anxiety will decrease Outcome: Not Progressing   Problem: Elimination: Goal: Will not experience complications related to bowel motility Outcome: Not Progressing Goal: Will not experience complications related to urinary retention Outcome: Not Progressing   Problem: Pain Managment: Goal: General experience of comfort will improve Outcome: Not Progressing   Problem: Safety: Goal: Ability to remain free from injury will improve Outcome: Not Progressing   Problem: Skin Integrity: Goal: Risk for impaired skin integrity will decrease Outcome: Not Progressing   Problem: Education: Goal: Ability to describe self-care measures that may prevent or decrease complications (Diabetes Survival Skills Education) will improve Outcome: Not Progressing Goal: Individualized Educational Video(s) Outcome: Not Progressing   Problem: Coping: Goal: Ability to adjust to condition or change in health will  improve Outcome: Not Progressing   Problem: Fluid Volume: Goal: Ability to maintain a balanced intake and output will improve Outcome: Not Progressing   Problem: Health Behavior/Discharge Planning: Goal: Ability to identify and utilize available resources and services will improve Outcome: Not Progressing Goal: Ability to manage health-related needs will improve Outcome: Not Progressing   Problem: Metabolic: Goal: Ability to maintain appropriate glucose levels will improve Outcome: Not Progressing   Problem: Nutritional: Goal: Maintenance of adequate nutrition will improve Outcome: Not Progressing Goal: Progress toward achieving an optimal weight will improve Outcome: Not Progressing   Problem: Skin Integrity: Goal: Risk for impaired skin integrity will decrease Outcome: Not Progressing   Problem: Tissue Perfusion: Goal: Adequacy of tissue perfusion will improve Outcome: Not Progressing   Problem: Education: Goal: Ability to manage disease process will improve Outcome: Not Progressing   Problem: Cardiac: Goal: Ability to achieve and maintain adequate cardiopulmonary perfusion will improve Outcome: Not Progressing   Problem: Neurologic: Goal: Promote progressive neurologic recovery Outcome: Not Progressing   Problem: Skin Integrity: Goal: Risk for impaired skin integrity will be minimized. Outcome: Not Progressing

## 2022-12-15 NOTE — Procedures (Signed)
Patient Name: Travis Hanna  MRN: [id number]  Epilepsy Attending: Charlsie Quest  Referring Physician/Provider: Cheri Fowler, MD  Duration: 12/14/2022 0930 to 12/15/2022 0930   Patient history:  67 year old man with down syndrome, seizure disorder, s/p cardiac arrest. EEG to evaluate for seizure.     Level of alertness: comatose   AEDs during EEG study: LEV, Propofol   Technical aspects: This EEG study was done with scalp electrodes positioned according to the 10-20 International system of electrode placement. Electrical activity was reviewed with band pass filter of 1-70Hz , sensitivity of 7 uV/mm, display speed of 62mm/sec with a 60Hz  notched filter applied as appropriate. EEG data were recorded continuously and digitally stored. Video monitoring was available and reviewed as appropriate.   Description: This study showed generalized periodic epileptiform discharges at 1 Hz admixed with generalized background suppression. Hyperventilation and photic stimulation were not performed.      ABNORMALITY - Periodic epileptiform discharges, generalized - Background suppression, generalized   IMPRESSION: This study showed evidence of epileptogenicity with generalized onset as well as profound diffuse encephalopathy. In the setting of cardiac arrest, this EEG pattern is suggestive of anoxic/hypoxic brain injury.   [name]

## 2022-12-15 NOTE — Progress Notes (Signed)
[facility] ADULT ICU REPLACEMENT PROTOCOL   The patient does apply for the [facility] Adult ICU Electrolyte Replacment Protocol based on the criteria listed below:   1.Exclusion criteria: TCTS, ECMO, Dialysis, and Myasthenia Gravis patients 2. Is GFR >/= 30 ml/min? Yes.    Patient's GFR today is >60 3. Is SCr </= 2? Yes.   Patient's SCr is 0.59 mg/dL 4. Did SCr increase >/= 0.5 in 24 hours? No. 5.Pt's weight >40kg  Yes.   6. Abnormal electrolyte(s):   K 3.5, Mg 1.9, Phos 2.2  7. Electrolytes replaced per protocol 8.  Call MD STAT for K+ </= 2.5, Phos </= 1, or Mag </= 1 Physician:  A. Florestine Avers R Shima Compere 12/15/2022 5:58 AM

## 2022-12-15 NOTE — Progress Notes (Signed)
NAME:  Travis Hanna, MRN:  [id number], DOB:  1955/08/01, LOS: 2 ADMISSION DATE:  12/13/2022 CONSULTATION DATE:  12/13/2022 REFERRING MD:  Pilar Plate - EDP CHIEF COMPLAINT:  Cardiac arrest   History of Present Illness:  67 year old man who presented to [facility] ED 8/7 from group home post-cardiac arrest. PMHx significant for HTN, hypothyroidism, seizure disorder, Down syndrome, anemia, OA.  History is obtained primarily from chart review. Per report, bed check was completed around 2330 without cause for concern. Found blue, apneic and pulseless ~midnight. Fire initiated compressions; initial rhythm asystole. EMS began CPR at 0013, completed x 6 minutes (2 rounds of CPR and 1 Epi) with ROSC. Patient was bagged with BVM and intubated on ED arrival without sedation/paralytic. Concern for vomitus/food material in airway on intubation with suspicion for aspiration event +/- seizure.  Labs were notable for WBC 18.8, Hgb 13.0, Plt 296. Na 131, K 4.0, CO2 18, BUN/Cr 14/1.28 (baseline ~0.8), LFTs WNL. Glucose 218. A1c 5.5%. Trop 56 > 137. LA 8.2 > 2.7. ABG 7.425/27.7/260/18.4. CXR unremarkable. CT Head NAICA, stable volume loss/white matter disease, 4mm colloid cyst; CT C-spine without acute fracture or listhesis, advanced multilevel DDD. CTA Chest without PE, mild posterior RUL/BLL atelectasis, multiple rib fractures.   PCCM consulted for ICU admission.  Pertinent Medical History:   Past Medical History:  Diagnosis Date   Anemia    Arthritis    Down syndrome    Frequent falls    Hypertension    now with hypotension   Hypothyroidism    MR (mental retardation)    Myopia with astigmatism    Osteoporosis    Seizure disorder (HCC)    Significant Hospital Events: Including procedures, antibiotic start and stop dates in addition to other pertinent events   8/7 - Presented to ED post-arrest. CPR x 6 mins with EMS with ROSC, Epi x 1. Intubated in ED.  Interim History / Subjective:  EEG showing myoclonus  probably related to anoxic brain injury Remain afebrile On full support mechanical ventilation  Objective:  Blood pressure 108/74, pulse 74, temperature 99.1 F (37.3 C), resp. rate (!) 27, height 4\' 7"  (1.397 m), weight 56 kg, SpO2 97%.    Vent Mode: PRVC FiO2 (%):  [40 %] 40 % Set Rate:  [18 bmp] 18 bmp Vt Set:  [310 mL] 310 mL PEEP:  [5 cmH20] 5 cmH20 Plateau Pressure:  [12 cmH20] 12 cmH20   Intake/Output Summary (Last 24 hours) at 12/15/2022 1004 Last data filed at 12/15/2022 1000 Gross per 24 hour  Intake 701.46 ml  Output 1195 ml  Net -493.54 ml   Filed Weights   12/13/22 0330 12/14/22 0330 12/15/22 0436  Weight: 57.7 kg 57.9 kg 56 kg   Physical Examination: General: Crtitically ill-appearing male, orally intubated HEENT: [place]/AT, eyes anicteric.  ETT and OGT in place Neuro: Sedated, not following commands.  Eyes are closed.  Pupils 3 mm bilateral reactive to light.  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 Continue telemetry monitoring Completed TTM protocol for 48 hours  MRI brain is pending EEG is suggestive of myoclonus probable related to anoxic brain injury Echocardiogram showed no wall motion abnormalities with normal EF  Seizure disorder Patient with seizure disorder at baseline Continue propofol Continue Keppra Continue seizure precautions  Acute hypoxemic respiratory failure Aspiration pneumonia Continue lung protective ventilation FiO2 and PEEP  was titrated down to 40% 5 respectively VAP prevention bundle in place Pad protocol with propofol and as needed fentanyl, RASS goal -1 Respiratory culture is pending Continue IV antibiotics  Anemia, chronic macrocytic Bloody GI output Folate and B12 levels are within normal limits Monitor H&H and transfuse if less than 7 Continue Protonix  Down syndrome Patient has a legal guardian Vidal Schwalbe (niece)  Best Practice: (right click and "Reselect all SmartList Selections" daily)   Diet/type: NPO, will wait till MRI, before starting tube feeds DVT prophylaxis: Enoxaparin GI prophylaxis: PPI Lines: N/A Foley:  Yes, and it is still needed Code Status:  full code Last date of multidisciplinary goals of care discussion [8/7: Patient legal guardian was updated at bedside, decision to make him DNR and continue full scope of care]  Labs:  CBC: Recent Labs  Lab 12/13/22 0047 12/13/22 0200 12/13/22 0222 12/13/22 0430 12/14/22 0418 12/15/22 0427  WBC 18.8*  --  19.1* 17.8* 9.1 9.9  HGB 13.0 12.6* 13.5 13.6 11.2* 11.8*  HCT 39.9 37.0* 40.1 39.2 33.2* 35.0*  MCV 114.7*  --  112.6* 107.4* 109.2* 109.7*  PLT 296  --  293 279 222 193   Basic Metabolic Panel: Recent Labs  Lab 12/13/22 0047 12/13/22 0200 12/13/22 0222 12/13/22 0507 12/13/22 0555 12/14/22 0418 12/15/22 0427  NA 131* 134*  --   --  131* 131* 133*  K 4.0 3.7  --   --  4.2 4.2 3.5  CL 98  --   --   --  103 103 103  CO2 18*  --   --   --  17* 22 23  GLUCOSE 218*  --   --   --  165* 121* 94  BUN 14  --   --   --  14 14 8   CREATININE 1.28*  --  1.06  --  0.98 0.66 0.59*  CALCIUM 8.1*  --   --   --  7.8* 7.3* 7.5*  MG  --   --   --  1.9 1.9 2.2 1.9  PHOS  --   --   --  2.9  --  2.5 2.2*   GFR: Estimated Creatinine Clearance: 60 mL/min (A) (by C-G formula based on SCr of 0.59 mg/dL (L)). Recent Labs  Lab 12/13/22 0120 12/13/22 0222 12/13/22 0257 12/13/22 0430 12/13/22 0507 12/13/22 1014 12/14/22 0418  12/15/22 0427  WBC  --  19.1*  --  17.8*  --   --  9.1 9.9  LATICACIDVEN 8.2*  --  2.7*  --  3.1* 1.6  --   --    Liver Function Tests: Recent Labs  Lab 12/13/22 0047 12/13/22 0555 12/14/22 0418 12/15/22 0427  AST 41 66* 53* 39  ALT 31 34 27 23  ALKPHOS 117 87 64 65  BILITOT 0.4 0.5 0.8 0.4  PROT 6.5 6.4* 5.7* 5.7*  ALBUMIN 2.6* 2.6* 2.4* 2.3*   No results for input(s): "LIPASE", "AMYLASE" in the last 168 hours. No results for input(s): "AMMONIA" in the last 168 hours.  ABG:    Component Value Date/Time   PHART 7.425 12/13/2022 0200   PCO2ART 27.7 (L) 12/13/2022 0200  PO2ART 260 (H) 12/13/2022 0200   HCO3 18.4 (L) 12/13/2022 0200   TCO2 19 (L) 12/13/2022 0200   ACIDBASEDEF 5.0 (H) 12/13/2022 0200   O2SAT 100 12/13/2022 0200    Coagulation Profile: No results for input(s): "INR", "PROTIME" in the last 168 hours.  Cardiac Enzymes: No results for input(s): "CKTOTAL", "CKMB", "CKMBINDEX", "TROPONINI" in the last 168 hours.  HbA1C: Hgb A1c MFr Bld  Date/Time Value Ref Range Status  12/13/2022 02:22 AM 5.5 4.8 - 5.6 % Final    Comment:    (NOTE) Pre diabetes:          5.7%-6.4%  Diabetes:              >6.4%  Glycemic control for   <7.0% adults with diabetes   08/24/2016 08:09 AM 5.0 4.8 - 5.6 % Final    Comment:    (NOTE)         Pre-diabetes: 5.7 - 6.4         Diabetes: >6.4         Glycemic control for adults with diabetes: <7.0    CBG: Recent Labs  Lab 12/14/22 1648 12/14/22 1959 12/15/22 0008 12/15/22 0426 12/15/22 0755  GLUCAP 102* 81 89 97 85    The patient is critically ill due to status post PEA cardiac arrest/aspiration pneumonia.  Critical care was necessary to treat or prevent imminent or life-threatening deterioration.  Critical care was time spent personally by me on the following activities: development of treatment plan with patient and/or surrogate as well as nursing, discussions with consultants, evaluation of patient's response to  treatment, examination of patient, obtaining history from patient or surrogate, ordering and performing treatments and interventions, ordering and review of laboratory studies, ordering and review of radiographic studies, pulse oximetry, re-evaluation of patient's condition and participation in multidisciplinary rounds.   During this encounter critical care time was devoted to patient care services described in this note for 35 minutes.     Cheri Fowler, MD [place] See Amion for pager If no response to pager, please call [phone] until 7pm After 7pm, Please call E-link [phone]

## 2022-12-16 DIAGNOSIS — J9601 Acute respiratory failure with hypoxia: Secondary | ICD-10-CM | POA: Diagnosis not present

## 2022-12-16 DIAGNOSIS — I469 Cardiac arrest, cause unspecified: Secondary | ICD-10-CM | POA: Diagnosis not present

## 2022-12-16 LAB — BASIC METABOLIC PANEL WITH GFR
Anion gap: 8 (ref 5–15)
BUN: <5 mg/dL — ABNORMAL LOW (ref 8–23)
CO2: 22 mmol/L (ref 22–32)
Calcium: 7.6 mg/dL — ABNORMAL LOW (ref 8.9–10.3)
Chloride: 104 mmol/L (ref 98–111)
Creatinine, Ser: 0.79 mg/dL (ref 0.61–1.24)
GFR, Estimated: >60 mL/min
Glucose, Bld: 90 mg/dL (ref 70–99)
Potassium: 3.9 mmol/L (ref 3.5–5.1)
Sodium: 134 mmol/L — ABNORMAL LOW (ref 135–145)

## 2022-12-16 LAB — GLUCOSE, CAPILLARY
Glucose-Capillary: 95 mg/dL (ref 70–99)
Glucose-Capillary: 88 mg/dL (ref 70–99)

## 2022-12-16 LAB — MAGNESIUM: Magnesium: 2 mg/dL (ref 1.7–2.4)

## 2022-12-16 MED ORDER — GLYCOPYRROLATE 0.2 MG/ML IJ SOLN
INTRAMUSCULAR | Status: AC
  Filled 2022-12-16: qty 1

## 2022-12-16 MED ORDER — GLYCOPYRROLATE 0.2 MG/ML IJ SOLN
0.2000 mg | INTRAMUSCULAR | Status: DC | PRN
  Administered 2022-12-16: 0.2 mg via INTRAVENOUS

## 2022-12-16 MED ORDER — GLYCOPYRROLATE 1 MG PO TABS: 1.0000 mg | ORAL_TABLET | ORAL | Status: DC | PRN

## 2022-12-16 MED ORDER — POLYVINYL ALCOHOL 1.4 % OP SOLN: 1.0000 [drp] | Freq: Four times a day (QID) | OPHTHALMIC | Status: DC | PRN

## 2022-12-16 MED ORDER — MIDAZOLAM HCL 2 MG/2ML IJ SOLN: 2.0000 mg | INTRAMUSCULAR | Status: DC | PRN

## 2022-12-16 MED ORDER — SODIUM CHLORIDE 0.9 % IV SOLN: INTRAVENOUS | Status: DC

## 2022-12-16 MED ORDER — MIDAZOLAM-SODIUM CHLORIDE 100-0.9 MG/100ML-% IV SOLN
0.5000 mg/h | INTRAVENOUS | Status: DC
  Administered 2022-12-16: 2 mg/h via INTRAVENOUS
  Filled 2022-12-16: qty 100

## 2022-12-16 MED ORDER — GLYCOPYRROLATE 0.2 MG/ML IJ SOLN: 0.2000 mg | INTRAMUSCULAR | Status: DC | PRN

## 2022-12-16 MED ORDER — ACETAMINOPHEN 650 MG RE SUPP: 650.0000 mg | Freq: Four times a day (QID) | RECTAL | Status: DC | PRN

## 2022-12-16 MED ORDER — ACETAMINOPHEN 325 MG PO TABS: 650.0000 mg | ORAL_TABLET | Freq: Four times a day (QID) | ORAL | Status: DC | PRN

## 2022-12-16 MED ORDER — MORPHINE SULFATE (PF) 2 MG/ML IV SOLN
2.0000 mg | INTRAVENOUS | Status: DC | PRN
  Administered 2022-12-16: 2 mg via INTRAVENOUS
  Filled 2022-12-16: qty 1

## 2022-12-16 MED ORDER — MORPHINE 100MG IN NS 100ML (1MG/ML) PREMIX INFUSION
1.0000 mg/h | INTRAVENOUS | Status: DC
  Administered 2022-12-16: 10 mg/h via INTRAVENOUS
  Filled 2022-12-16: qty 100

## 2023-01-07 NOTE — Progress Notes (Signed)
Postextubation patient started struggling to breathe with increasing secretions  Patient's family decided to proceed with comfort care, comfort care orders are written.  Family is at bedside    Cheri Fowler, MD [facility] See Amion for pager If no response to pager, please call [phone] until 7pm After 7pm, Please call E-link [phone]

## 2023-01-07 NOTE — Death Summary Note (Signed)
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 AKI, due to ATN in the setting of cardiac arrest Lactic acidosis, resolved Seizure disorder  Acute hypoxemic respiratory failure Aspiration pneumonia Anemia, chronic macrocytic  Down syndrome   Diagnoses  Preliminary cause of death: Withdrawal of care in the setting of severe anoxic brain injury status postcardiac arrest Secondary Diagnoses (including complications and co-morbidities):  Principal Problem:   Cardiac arrest [facility])   Brief Hospital Course (including significant findings, care, treatment, and services provided and events leading to death)  Travis Hanna is a 67 y.o. year old male who presented to [facility] ED 12/26/22 from group home post-cardiac arrest. PMHx significant for HTN, hypothyroidism, seizure disorder, Down syndrome, anemia, OA.   History is obtained primarily from chart review. Per report, bed check was completed around 2329/01/05 without cause for concern. Found blue, apneic and pulseless ~midnight. Fire initiated compressions; initial rhythm asystole. EMS began CPR at 0013, completed x 6 minutes (2 rounds of CPR and 1 Epi) with ROSC. Patient was bagged with BVM and intubated on ED arrival without sedation/paralytic. Concern for vomitus/food material in airway on intubation with suspicion for aspiration event +/- seizure.   Patient was admitted to ICU, he was continued on TTM protocol, EEG was done which showed status myoclonus, neurology was consulted, recommend continuing Keppra and patient was started on propofol After 48  hours of TTM therapy, MRI brain was done which was consistent with severe hypoxic/anoxic brain injury Initially he had acute kidney injury due to ischemic ATN which has improved, lactic acidosis resolved He had a history of seizure disorder prior, was continued on AED He was continued on lung protective ventilation Started on antibiotic with Zosyn, became afebrile noted to have anemia with macrocytosis, B12 and folate was normal After MRI was done which showed patient had anoxic brain injury with underlying Down syndrome and poor quality of life patient's family decided to keep him DNR and proceed with comfort care, patient was palliatively extubated on 12-29-22, he passed on 5:02 PM.  Patient's family was at bedside   Pertinent Labs and Studies  Significant Diagnostic Studies MR BRAIN WO CONTRAST  Result Date: 12/15/2022 CLINICAL DATA:  Anoxic brain damage.  Cardiac arrest. EXAM: MRI HEAD WITHOUT CONTRAST TECHNIQUE: Multiplanar, multiecho pulse sequences of the brain and surrounding structures were obtained without intravenous contrast. COMPARISON:  Head CT 12-26-2022 FINDINGS: Brain: There is symmetrically restricted diffusion involving cerebral cortex bilaterally. There is also symmetrically restricted diffusion and T2 hyperintensity in the thalami. Cerebral cortical T2 hyperintensity is most conspicuous in the hippocampi. Scattered chronic microhemorrhages are noted in both cerebral hemispheres and in the cerebellum, and there is more extensive susceptibility in the bilateral basal ganglia extending into the cerebral peduncle suggestive of prominent iron deposition. T2 hyperintensities in the cerebral white matter are nonspecific but compatible with mild chronic small vessel ischemic disease. There is advanced cerebral atrophy, including severe bilateral mesial temporal lobe volume loss. Lacunar infarcts are noted in the deep gray nuclei bilaterally. A 4 mm T1 hyperintense colloid cyst is again noted  at the foramina of [place]. Vascular: Major  intracranial vascular flow voids are preserved. Skull and upper cervical spine: Unremarkable bone marrow signal. Sinuses/Orbits: Right cataract extraction. Mild-to-moderate mucosal thickening in the paranasal sinuses. Trace bilateral mastoid fluid. Other: None. IMPRESSION: 1. Symmetric restricted diffusion and T2 signal abnormality in the cerebral cortex and thalami consistent with hypoxic-ischemic injury. 2. Advanced cerebral atrophy. 3. Mild chronic small vessel ischemic disease. 4. 4 mm colloid cyst. Electronically Signed   By: Sebastian Ache M.D.   On: 12/15/2022 16:39   Overnight EEG with video  Result Date: 12/14/2022 Charlsie Quest, MD     12/14/2022  9:54 AM Patient Name: ISIDORO THALHEIMER MRN: [id number] Epilepsy Attending: Charlsie Quest Referring Physician/Provider: Cheri Fowler, MD Duration: 12/13/2022 0930 to 12/14/2022 0930 Patient history:  67 year old man with down syndrome, seizure disorder, s/p cardiac arrest. EEG to evaluate for seizure.  Level of alertness: comatose AEDs during EEG study: LEV, Propofol Technical aspects: This EEG study was done with scalp electrodes positioned according to the 10-20 International system of electrode placement. Electrical activity was reviewed with band pass filter of 1-70Hz , sensitivity of 7 uV/mm, display speed of 21mm/sec with a 60Hz  notched filter applied as appropriate. EEG data were recorded continuously and digitally stored. Video monitoring was available and reviewed as appropriate. Description: Patient was noted to have episodes of brief sudden eye opening with whole body jerking every few 15-20 seconds. Concomitant EEG showed generalized polyspikes consistent with myoclonic seizures. In between seizures EEG showed generalized background suppression. Hyperventilation and photic stimulation were not performed.   ABNORMALITY - Myoclonic seizure, generalized - Background suppression, generalized IMPRESSION: At the  beginning of study, patient was noted to have myoclonic seizures every 15-20 seconds. Additionally there was evidence of profound diffuse encephalopathy.  In the setting of cardiac arrest, this EEG pattern is suggestive of anoxic/hypoxic brain injury. Charlsie Quest   ECHOCARDIOGRAM COMPLETE  Result Date: 12/13/2022    ECHOCARDIOGRAM REPORT   Patient Name:   LORIMER TAKASHIMA Date of Exam: 12/13/2022 Medical Rec #:  [id number]         Height:       55.0 in Accession #:    [id number]        Weight:       127.2 lb Date of Birth:  1956-02-17         BSA:          1.446 m Patient Age:    66 years          BP:           101/77 mmHg Patient Gender: M                 HR:           65 bpm. Exam Location:  Inpatient Procedure: 2D Echo, Cardiac Doppler and Color Doppler Indications:    Cardiac arrest I46.9  History:        Patient has prior history of Echocardiogram examinations, most                 recent 08/24/2016. Stroke, Arrythmias:Bradycardia; Risk                 Factors:Hypertension.  Sonographer:    Lucendia Herrlich Referring Phys: [id number] STEPHANIE M REESE IMPRESSIONS  1. Poor quality images due to lung interference.  2. Left ventricular ejection fraction, by estimation, is 60 to 65%. The left ventricle has normal function. Left ventricular endocardial border not optimally defined to evaluate regional wall  motion. Left ventricular diastolic function could not be evaluated.  3. Right ventricular systolic function is normal. The right ventricular size is normal.  4. The mitral valve is normal in structure. No evidence of mitral valve regurgitation. No evidence of mitral stenosis.  5. The aortic valve is tricuspid. Aortic valve regurgitation is trivial. Aortic valve sclerosis/calcification is present, without any evidence of aortic stenosis.  6. A small pericardial effusion is present. The pericardial effusion is circumferential. FINDINGS  Left Ventricle: Left ventricular ejection fraction, by estimation, is 60 to  65%. The left ventricle has normal function. Left ventricular endocardial border not optimally defined to evaluate regional wall motion. The left ventricular internal cavity size was normal in size. There is no left ventricular hypertrophy. Left ventricular diastolic function could not be evaluated. Right Ventricle: The right ventricular size is normal. No increase in right ventricular wall thickness. Right ventricular systolic function is normal. Left Atrium: Left atrial size was normal in size. Right Atrium: Right atrial size was normal in size. Pericardium: A small pericardial effusion is present. The pericardial effusion is circumferential. Mitral Valve: The mitral valve is normal in structure. Mild to moderate mitral annular calcification. No evidence of mitral valve regurgitation. No evidence of mitral valve stenosis. Tricuspid Valve: The tricuspid valve is normal in structure. Tricuspid valve regurgitation is trivial. No evidence of tricuspid stenosis. Aortic Valve: The aortic valve is tricuspid. Aortic valve regurgitation is trivial. Aortic valve sclerosis/calcification is present, without any evidence of aortic stenosis. Pulmonic Valve: The pulmonic valve was normal in structure. Pulmonic valve regurgitation is not visualized. No evidence of pulmonic stenosis. Aorta: The aortic root is normal in size and structure. Venous: The inferior vena cava was not well visualized. IAS/Shunts: No atrial level shunt detected by color flow Doppler.  LEFT VENTRICLE PLAX 2D LVIDd:         3.80 cm LVIDs:         2.70 cm LV PW:         0.80 cm LV IVS:        0.60 cm LVOT diam:     2.20 cm LVOT Area:     3.80 cm  LEFT ATRIUM         Index LA diam:    2.70 cm 1.87 cm/m   AORTA Ao Root diam: 3.10 cm TRICUSPID VALVE TR Peak grad:   32.3 mmHg TR Vmax:        284.00 cm/s  SHUNTS Systemic Diam: 2.20 cm Armanda Magic MD Electronically signed by Armanda Magic MD Signature Date/Time: 12/13/2022/1:03:38 PM    Final    EEG  adult  Result Date: 12/13/2022 Windell Norfolk, MD     12/13/2022  8:38 AM Patient Name: DEVERICK WARCHOL MRN: [id number] Epilepsy Attending: Windell Norfolk Date: 12/13/2022 Duration: 30 minutes Patient history: 67 year old man with down syndrome, seizure disorder, s/p cardiac arrest. EEG for background characterization Level of alertness: Intubated and sedated AEDs during EEG study: Technical aspects: This EEG study was done with scalp electrodes positioned according to the 10-20 International system of electrode placement. Electrical activity was reviewed with band pass filter of 1-70Hz , sensitivity of 7 uV/mm, display speed of 46mm/sec with a 60Hz  notched filter applied as appropriate. EEG data were recorded continuously and digitally stored.  Video monitoring was available and reviewed as appropriate. Description: EEG was diffusely suppressed with no reactivity. There were 2 separate bursts of polyspikes associated with eye opening and full body jerk consistent with cortical myoclonus. Marland Kitchen  ABNORMALITY - Diffuse suppressed background with no reactivity - Cortical myoclonus seen IMPRESSION: This study is suggestive severe diffuse encephalopathy, nonspecific etiology but likely related to sedation, toxic-metabolic etiology, anoxic/hypoxic brain injury. 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 In the setting of cardiac arrest, this EEG pattern is suggestive of anoxic/hypoxic brain injury. Dr. Merrily Pew was notified via epic chat. Amadou Camara   DG Abd 1 View  Result Date: 12/13/2022 CLINICAL DATA:  67 year old male enteric tube placement. EXAM: ABDOMEN - 1 VIEW COMPARISON:  0128 hours today and earlier. FINDINGS: Portable AP semi upright view at 0356 hours. Enteric tube has been placed and now terminates in the left upper quadrant at the level of the gastric body. Side hole is visible there. Superimposed pacer or resuscitation pads project over the lower chest and upper  abdomen. Lung bases appears stable and negative. Nonobstructed bowel-gas pattern with retained stool. Moderate levoconvex thoracolumbar scoliosis. IMPRESSION: Satisfactory enteric tube placement into the stomach. Electronically Signed   By: Odessa Fleming M.D.   On: 12/13/2022 04:25   CT HEAD WO CONTRAST ( )  Result Date: 12/13/2022 CLINICAL DATA:  Neuro deficit, acute, stroke suspected; Neck trauma, impaired ROM (Age 54-64y) EXAM: CT HEAD WITHOUT CONTRAST CT CERVICAL SPINE WITHOUT CONTRAST TECHNIQUE: Multidetector CT imaging of the head and cervical spine was performed following the standard protocol without intravenous contrast. Multiplanar CT image reconstructions of the cervical spine were also generated. RADIATION DOSE REDUCTION: This exam was performed according to the departmental dose-optimization program which includes automated exposure control, adjustment of the mA and/or kV according to patient size and/or use of iterative reconstruction technique. COMPARISON:  08/04/2021 FINDINGS: CT HEAD FINDINGS Brain: There is relatively advanced parenchymal volume loss globally in relation of the patient's age which appears relatively stable when compared to prior examination. Mild periventricular white matter changes are present, likely reflecting the sequela of small vessel ischemia, stable since prior examination. No acute intracranial hemorrhage or infarct. No abnormal mass effect or midline shift. No abnormal intra or extra-axial fluid collection. 4 mm colloid cyst seen in the region of the foramina [place]. There is moderate ventriculomegaly again identified likely reflecting ex vacuo dilation of the ventricles secondary to central atrophy. The cerebellum is unremarkable. Vascular: No asymmetric hyperdense vasculature at the skull base. Skull: Intact Sinuses/Orbits: There is mucosal thickening within the ethmoid air cells, opacification of the left sphenoid sinus, and mild layering fluid within the maxillary and  right sphenoid sinus. Orbits are unremarkable. Other: Mastoid air cells and middle ear cavities are clear. CT CERVICAL SPINE FINDINGS Alignment: Normal. Skull base and vertebrae: Craniocervical alignment is normal. The atlantodental interval is not widened. No acute fracture of the cervical spine Soft tissues and spinal canal: Exuberant posterior disc osteophyte complex at C6-7 results in moderate central canal stenosis with abutment and flattening of the thecal sac. AP diameter of the spinal canal in this region is 6-7 mm. No canal hematoma. No prevertebral soft tissue swelling or paraspinal fluid collection. Disc levels: There is degenerative ankylosis of C4-C6. Bridging osteophytes noted at C6-7. There is ankylosis of the right C4-5 facet joint. Severe intervertebral disc space narrowing at the remaining segments of the cervical spine are in keeping with changes of advanced degenerative disc disease. Multilevel uncovertebral and facet arthrosis results in severe right neuroforaminal narrowing at C3-4 and C4-5. Upper chest: Negative. Other: None IMPRESSION: 1. No acute intracranial abnormality. No calvarial fracture. 2. Stable advanced parenchymal volume loss and mild  white matter disease. 3. 4 mm colloid cyst in the region of the foramina of [place]. No associated hydrocephalus. 4. No acute fracture or listhesis of the cervical spine. 5. Advanced multilevel degenerative disc and degenerative joint disease resulting in multilevel neuroforaminal narrowing and moderate central canal stenosis at C6-7. Electronically Signed   By: Helyn Numbers M.D.   On: 12/13/2022 03:03   CT CERVICAL SPINE WO CONTRAST  Result Date: 12/13/2022 CLINICAL DATA:  Neuro deficit, acute, stroke suspected; Neck trauma, impaired ROM (Age 11-64y) EXAM: CT HEAD WITHOUT CONTRAST CT CERVICAL SPINE WITHOUT CONTRAST TECHNIQUE: Multidetector CT imaging of the head and cervical spine was performed following the standard protocol without intravenous  contrast. Multiplanar CT image reconstructions of the cervical spine were also generated. RADIATION DOSE REDUCTION: This exam was performed according to the departmental dose-optimization program which includes automated exposure control, adjustment of the mA and/or kV according to patient size and/or use of iterative reconstruction technique. COMPARISON:  08/04/2021 FINDINGS: CT HEAD FINDINGS Brain: There is relatively advanced parenchymal volume loss globally in relation of the patient's age which appears relatively stable when compared to prior examination. Mild periventricular white matter changes are present, likely reflecting the sequela of small vessel ischemia, stable since prior examination. No acute intracranial hemorrhage or infarct. No abnormal mass effect or midline shift. No abnormal intra or extra-axial fluid collection. 4 mm colloid cyst seen in the region of the foramina [place]. There is moderate ventriculomegaly again identified likely reflecting ex vacuo dilation of the ventricles secondary to central atrophy. The cerebellum is unremarkable. Vascular: No asymmetric hyperdense vasculature at the skull base. Skull: Intact Sinuses/Orbits: There is mucosal thickening within the ethmoid air cells, opacification of the left sphenoid sinus, and mild layering fluid within the maxillary and right sphenoid sinus. Orbits are unremarkable. Other: Mastoid air cells and middle ear cavities are clear. CT CERVICAL SPINE FINDINGS Alignment: Normal. Skull base and vertebrae: Craniocervical alignment is normal. The atlantodental interval is not widened. No acute fracture of the cervical spine Soft tissues and spinal canal: Exuberant posterior disc osteophyte complex at C6-7 results in moderate central canal stenosis with abutment and flattening of the thecal sac. AP diameter of the spinal canal in this region is 6-7 mm. No canal hematoma. No prevertebral soft tissue swelling or paraspinal fluid collection. Disc  levels: There is degenerative ankylosis of C4-C6. Bridging osteophytes noted at C6-7. There is ankylosis of the right C4-5 facet joint. Severe intervertebral disc space narrowing at the remaining segments of the cervical spine are in keeping with changes of advanced degenerative disc disease. Multilevel uncovertebral and facet arthrosis results in severe right neuroforaminal narrowing at C3-4 and C4-5. Upper chest: Negative. Other: None IMPRESSION: 1. No acute intracranial abnormality. No calvarial fracture. 2. Stable advanced parenchymal volume loss and mild white matter disease. 3. 4 mm colloid cyst in the region of the foramina of [place]. No associated hydrocephalus. 4. No acute fracture or listhesis of the cervical spine. 5. Advanced multilevel degenerative disc and degenerative joint disease resulting in multilevel neuroforaminal narrowing and moderate central canal stenosis at C6-7. Electronically Signed   By: Helyn Numbers M.D.   On: 12/13/2022 03:03   CT Angio Chest Pulmonary Embolism (PE) W or WO Contrast  Result Date: 12/13/2022 CLINICAL DATA:  Chest pain. EXAM: CT ANGIOGRAPHY CHEST WITH CONTRAST TECHNIQUE: Multidetector CT imaging of the chest was performed using the standard protocol during bolus administration of intravenous contrast. Multiplanar CT image reconstructions and MIPs were obtained to evaluate the  vascular anatomy. RADIATION DOSE REDUCTION: This exam was performed according to the departmental dose-optimization program which includes automated exposure control, adjustment of the mA and/or kV according to patient size and/or use of iterative reconstruction technique. CONTRAST:  75mL OMNIPAQUE IOHEXOL 350 MG/ML SOLN COMPARISON:  None Available. FINDINGS: Cardiovascular: The thoracic aorta is normal in appearance satisfactory opacification of the pulmonary arteries to the segmental level. No evidence of pulmonary embolism. Normal heart size with moderate severity coronary artery  calcification. No pericardial effusion. Mediastinum/Nodes: Endotracheal and enteric tubes are in place. The distal tip of the enteric tube is seen within a small hiatal hernia. No enlarged mediastinal, hilar, or axillary lymph nodes. Thyroid gland, trachea, and esophagus demonstrate no significant findings. Lungs/Pleura: Mild posterior right upper lobe and mild to moderate severity posterior bilateral lower lobe atelectasis is seen. No pleural effusion or pneumothorax is identified. Upper Abdomen: A metallic density focus is seen within the anterior aspect of the mid to upper left abdomen. Musculoskeletal: Acute anterior second, third, fourth, fifth and sixth right rib fractures are seen. Multilevel degenerative changes are noted throughout the thoracic spine. Review of the MIP images confirms the above findings. IMPRESSION: 1. No evidence of pulmonary embolism. 2. Mild posterior right upper lobe and mild to moderate severity posterior bilateral lower lobe atelectasis. 3. Acute anterior second, third, fourth, fifth and sixth right rib fractures. 4. Small hiatal hernia. 5. Malpositioned enteric tube, as described above. Further advancement of approximately 8 cm is recommended. Electronically Signed   By: Aram Candela M.D.   On: 12/13/2022 03:00   DG Abdomen 1 View  Result Date: 12/13/2022 CLINICAL DATA:  Nasogastric tube placement EXAM: ABDOMEN - 1 VIEW COMPARISON:  None Available. FINDINGS: The tip of the nasogastric tube is at the GE junction. The side port is in the thoracic esophagus. Recommend advancing by 10 cm. IMPRESSION: The tip of the nasogastric tube is at the GE junction. Recommend advancing by 10 cm. Electronically Signed   By: Deatra Robinson M.D.   On: 12/13/2022 01:46   DG Abdomen 1 View  Result Date: 12/13/2022 CLINICAL DATA:  NG tube placement. EXAM: ABDOMEN - 1 VIEW COMPARISON:  None Available. FINDINGS: An enteric tube is seen with its distal tip overlying the expected region of the  distal esophagus. This is approximately 2.5 cm proximal to the expected region of the gastroesophageal junction. The bowel gas pattern is normal. A large amount of stool is seen throughout the visualized portion of the large bowel. No radio-opaque calculi or other significant radiographic abnormality are seen. IMPRESSION: 1. Enteric tube positioning, as described above. Advancement of the enteric tube by approximately 10 cm is recommended. 2. Large stool burden without evidence of bowel obstruction. Electronically Signed   By: Aram Candela M.D.   On: 12/13/2022 01:16   DG Chest Port 1 View  Result Date: 12/13/2022 CLINICAL DATA:  Status post CPR and endotracheal tube placement. EXAM: PORTABLE CHEST 1 VIEW COMPARISON:  October 20, 2021 FINDINGS: An endotracheal tube is seen with its distal tip approximately 2.3 cm from the carina. An enteric tube is also present with its distal tip seen at the level of the hilum, overlying the expected region of the mid esophagus. The heart size and mediastinal contours are within normal limits. Both lungs are clear. The visualized skeletal structures are unremarkable. IMPRESSION: 1. Endotracheal tube positioning, as described above. 2. Malpositioned enteric tube, as described above. Electronically Signed   By: Aram Candela M.D.   On: 12/13/2022  01:15    Microbiology Recent Results (from the past 240 hour(s))  MRSA Next Gen by PCR, Nasal     Status: None   Collection Time: 12/13/22  3:30 AM   Specimen: Nasal Mucosa; Nasal Swab  Result Value Ref Range Status   MRSA by PCR Next Gen NOT DETECTED NOT DETECTED Final    Comment: (NOTE) The GeneXpert MRSA Assay (FDA approved for NASAL specimens only), is one component of a comprehensive MRSA colonization surveillance program. It is not intended to diagnose MRSA infection nor to guide or monitor treatment for MRSA infections. Test performance is not FDA approved in patients less than 77 years old. Performed at [facility], [address], [place], [place] [address]     Lab Basic Metabolic Panel: Recent Labs  Lab 12/13/22 [id number] 12/13/22 0200 12/13/22 0222 12/13/22 0507 12/13/22 0555 12/14/22 0418 12/15/22 0427 12/22/2022 0414  NA 131* 134*  --   --  131* 131* 133* 134*  K 4.0 3.7  --   --  4.2 4.2 3.5 3.9  CL 98  --   --   --  103 103 103 104  CO2 18*  --   --   --  17* 22 23 22   GLUCOSE 218*  --   --   --  165* 121* 94 90  BUN 14  --   --   --  14 14 8  <5*  CREATININE 1.28*  --  1.06  --  0.98 0.66 0.59* 0.79  CALCIUM 8.1*  --   --   --  7.8* 7.3* 7.5* 7.6*  MG  --   --   --  1.9 1.9 2.2 1.9 2.0  PHOS  --   --   --  2.9  --  2.5 2.2* 2.6   Liver Function Tests: Recent Labs  Lab 12/13/22 0047 12/13/22 0555 12/14/22 0418 12/15/22 0427  AST 41 66* 53* 39  ALT 31 34 27 23  ALKPHOS 117 87 64 65  BILITOT 0.4 0.5 0.8 0.4  PROT 6.5 6.4* 5.7* 5.7*  ALBUMIN 2.6* 2.6* 2.4* 2.3*   No results for input(s): "LIPASE", "AMYLASE" in the last 168 hours. No results for input(s): "AMMONIA" in the last 168 hours. CBC: Recent Labs  Lab 12/13/22 0047 12/13/22 0200 12/13/22 0222 12/13/22 0430 12/14/22 0418 12/15/22 0427  WBC 18.8*  --  19.1* 17.8* 9.1 9.9  HGB 13.0 12.6* 13.5 13.6 11.2* 11.8*  HCT 39.9 37.0* 40.1 39.2 33.2* 35.0*  MCV 114.7*  --  112.6* 107.4* 109.2* 109.7*  PLT 296  --  293 279 222 193   Cardiac Enzymes: No results for input(s): "CKTOTAL", "CKMB", "CKMBINDEX", "TROPONINI" in the last 168 hours. Sepsis Labs: Recent Labs  Lab 12/13/22 0120 12/13/22 0222 12/13/22 0257 12/13/22 0430 12/13/22 0507 12/13/22 1014 12/14/22 0418 12/15/22 0427  WBC  --  19.1*  --  17.8*  --   --  9.1 9.9  LATICACIDVEN 8.2*  --  2.7*  --  3.1* 1.6  --   --     Procedures/Operations     [employer] 12/17/2022, 2:16 PM

## 2023-01-07 NOTE — Progress Notes (Signed)
PT Cancellation Note  Patient Details Name: Travis Hanna MRN: [id number] DOB: Jan 08, 1956   Cancelled Treatment:    Reason Eval/Treat Not Completed: (P) Other (comment). Pt going comfort care. Will defer PT Eval and sign off.   Raymond Gurney, PT, DPT Acute Rehabilitation Services  Office: [phone]    Jewel Baize 12/08/2022, 2:47 PM

## 2023-01-07 NOTE — Progress Notes (Signed)
0900 [facility] called.  Patient is not a candidate for organ donation.  Call with cardiac time of death.  1020 Patient extubated per family wishes.  Intent is not for patient to be reintubated.  Patient immediately agonally breathing and tachypneic.  Dr. Merrily Pew made aware.  OK to give Versed and Fentanyl per previous orders.  Will place comfort care order set.  1045 Patient still agonal breathing.  Attempting to contact pharmacy.  PRN meds given.  Family at bedside.   1137 Still having difficulty making patient comfortable.  Breathing using accessory muscles.  PRN meds given.  Dr. Merrily Pew made aware.  Will put in morphine drip. 1205 OK to start morphine gtt at 10 mg/hr per Dr. Merrily Pew. 1530 Dr. Merrily Pew aware of temperature.  Agrees tylenol is appropriate intervention.  1702 Patient asystole on monitor.  No heartbeat auscultated by C. Andrey Campanile, RN and K. Madilyn Fireman, RN for one full minute.  Supervision aware.  Family at bedside. [facility] notified. Prep completed per recommendations.

## 2023-01-07 NOTE — Progress Notes (Signed)
NAME:  Travis Hanna, MRN:  [id number], DOB:  Jun 08, 1955, LOS: 3 ADMISSION DATE:  12/13/2022 CONSULTATION DATE:  12/13/2022 REFERRING MD:  Pilar Plate - EDP CHIEF COMPLAINT:  Cardiac arrest   History of Present Illness:  67 year old man who presented to [facility] ED 8/7 from group home post-cardiac arrest. PMHx significant for HTN, hypothyroidism, seizure disorder, Down syndrome, anemia, OA.  History is obtained primarily from chart review. Per report, bed check was completed around 2330 without cause for concern. Found blue, apneic and pulseless ~midnight. Fire initiated compressions; initial rhythm asystole. EMS began CPR at 0013, completed x 6 minutes (2 rounds of CPR and 1 Epi) with ROSC. Patient was bagged with BVM and intubated on ED arrival without sedation/paralytic. Concern for vomitus/food material in airway on intubation with suspicion for aspiration event +/- seizure.  Labs were notable for WBC 18.8, Hgb 13.0, Plt 296. Na 131, K 4.0, CO2 18, BUN/Cr 14/1.28 (baseline ~0.8), LFTs WNL. Glucose 218. A1c 5.5%. Trop 56 > 137. LA 8.2 > 2.7. ABG 7.425/27.7/260/18.4. CXR unremarkable. CT Head NAICA, stable volume loss/white matter disease, 4mm colloid cyst; CT C-spine without acute fracture or listhesis, advanced multilevel DDD. CTA Chest without PE, mild posterior RUL/BLL atelectasis, multiple rib fractures.   PCCM consulted for ICU admission.  Pertinent Medical History:   Past Medical History:  Diagnosis Date   Anemia    Arthritis    Down syndrome    Frequent falls    Hypertension    now with hypotension   Hypothyroidism    MR (mental retardation)    Myopia with astigmatism    Osteoporosis    Seizure disorder (HCC)    Significant Hospital Events: Including procedures, antibiotic start and stop dates in addition to other pertinent events   8/7 - Presented to ED post-arrest. CPR x 6 mins with EMS with ROSC, Epi x 1. Intubated in ED.  Interim History / Subjective:  EEG showed myoclonus,  related to anoxic brain injury MRI brain confirmed hypoxic/anoxic brain injury  No overnight issues  Objective:  Blood pressure 94/65, pulse 78, temperature 99.7 F (37.6 C), resp. rate (!) 26, height 4\' 7"  (1.397 m), weight 55.6 kg, SpO2 98%.    Vent Mode: PRVC FiO2 (%):  [40 %] 40 % Set Rate:  [18 bmp] 18 bmp Vt Set:  [310 mL] 310 mL PEEP:  [5 cmH20] 5 cmH20 Plateau Pressure:  [9 cmH20-12 cmH20] 9 cmH20   Intake/Output Summary (Last 24 hours) at 12/13/2022 0859 Last data filed at 12/11/2022 0700 Gross per 24 hour  Intake 836.49 ml  Output 1091 ml  Net -254.51 ml   Filed Weights   12/14/22 0330 12/15/22 0436 12/20/2022 0400  Weight: 57.9 kg 56 kg 55.6 kg   Physical Examination: General: Crtitically ill-appearing male, orally intubated HEENT: [place]/AT, eyes anicteric.  ETT and OGT in place Neuro: Eyes closed, does not open, not following commands.  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 Continue telemetry monitoring Completed TTM protocol for 48 hours MRI brain  confirmed hypoxic/anoxic brain injury EEG is suggestive of myoclonus probable related to anoxic brain injury Echocardiogram showed no wall motion abnormalities with normal EF  Seizure disorder Continue propofol for now Continue Keppra Continue seizure precautions  Acute hypoxemic respiratory failure Aspiration pneumonia Continue lung protective ventilation FiO2 and PEEP was titrated down to  40% 5 respectively VAP prevention bundle in place Pad protocol with propofol and as needed fentanyl, RASS goal -1 Plan for one-way extubation today per discussion with family Continue IV antibiotics  Anemia, chronic macrocytic Bloody GI output Folate and B12 levels are within normal limits Monitor H&H and transfuse if less than 7 Continue Protonix  Down syndrome Patient has a legal guardian Vidal Schwalbe (niece)  Best Practice: (right click and "Reselect all SmartList Selections" daily)   Diet/type: NPO DVT prophylaxis: Enoxaparin GI prophylaxis: PPI Lines: N/A Foley:  Yes, and it is still needed Code Status:  full code Last date of multidisciplinary goals of care discussion [8/9: Plan for one-way extubation on 8/10 at 10 AM   Labs:  CBC: Recent Labs  Lab 12/13/22 0047 12/13/22 0200 12/13/22 0222 12/13/22 0430 12/14/22 0418 12/15/22 0427  WBC 18.8*  --  19.1* 17.8* 9.1 9.9  HGB 13.0 12.6* 13.5 13.6 11.2* 11.8*  HCT 39.9 37.0* 40.1 39.2 33.2* 35.0*  MCV 114.7*  --  112.6* 107.4* 109.2* 109.7*  PLT 296  --  293 279 222 193   Basic Metabolic Panel: Recent Labs  Lab 12/13/22 0047 12/13/22 0200 12/13/22 0222 12/13/22 0507 12/13/22 0555 12/14/22 0418 12/15/22 0427 12/15/2022 0414  NA 131* 134*  --   --  131* 131* 133* 134*  K 4.0 3.7  --   --  4.2 4.2 3.5 3.9  CL 98  --   --   --  103 103 103 104  CO2 18*  --   --   --  17* 22 23 22   GLUCOSE 218*  --   --   --  165* 121* 94 90  BUN 14  --   --   --  14 14 8  <5*  CREATININE 1.28*  --  1.06  --  0.98 0.66 0.59* 0.79  CALCIUM 8.1*  --   --   --  7.8* 7.3* 7.5* 7.6*  MG  --   --   --  1.9 1.9 2.2 1.9 2.0  PHOS  --   --   --  2.9  --  2.5 2.2* 2.6   GFR: Estimated Creatinine Clearance: 59.8 mL/min (by C-G formula based on SCr of 0.79 mg/dL). Recent Labs  Lab 12/13/22 0120 12/13/22 0222 12/13/22 0257 12/13/22 0430 12/13/22 0507 12/13/22 1014 12/14/22 0418 12/15/22 0427  WBC  --  19.1*  --  17.8*  --    --  9.1 9.9  LATICACIDVEN 8.2*  --  2.7*  --  3.1* 1.6  --   --    Liver Function Tests: Recent Labs  Lab 12/13/22 0047 12/13/22 0555 12/14/22 0418 12/15/22 0427  AST 41 66* 53* 39  ALT 31 34 27 23  ALKPHOS 117 87 64 65  BILITOT 0.4 0.5 0.8 0.4  PROT 6.5 6.4* 5.7* 5.7*  ALBUMIN 2.6* 2.6* 2.4* 2.3*   No results for input(s): "LIPASE", "AMYLASE" in the last 168 hours. No results for input(s): "AMMONIA" in the last 168 hours.  ABG:    Component Value Date/Time   PHART 7.425 12/13/2022 0200   PCO2ART 27.7 (L) 12/13/2022 0200   PO2ART 260 (H)  12/13/2022 0200   HCO3 18.4 (L) 12/13/2022 0200   TCO2 19 (L) 12/13/2022 0200   ACIDBASEDEF 5.0 (H) 12/13/2022 0200   O2SAT 100 12/13/2022 0200    Coagulation Profile: No results for input(s): "INR", "PROTIME" in the last 168 hours.  Cardiac Enzymes: No results for input(s): "CKTOTAL", "CKMB", "CKMBINDEX", "TROPONINI" in the last 168 hours.  HbA1C: Hgb A1c MFr Bld  Date/Time Value Ref Range Status  12/13/2022 02:22 AM 5.5 4.8 - 5.6 % Final    Comment:    (NOTE) Pre diabetes:          5.7%-6.4%  Diabetes:              >6.4%  Glycemic control for   <7.0% adults with diabetes   08/24/2016 08:09 AM 5.0 4.8 - 5.6 % Final    Comment:    (NOTE)         Pre-diabetes: 5.7 - 6.4         Diabetes: >6.4         Glycemic control for adults with diabetes: <7.0    CBG: Recent Labs  Lab 12/15/22 1959 12/15/22 2325 12/15/22 2358 12/15/2022 0414 01/01/2023 0743  GLUCAP 92 65* 156* 95 88    The patient is critically ill due to status post PEA cardiac arrest/aspiration pneumonia.  Critical care was necessary to treat or prevent imminent or life-threatening deterioration.  Critical care was time spent personally by me on the following activities: development of treatment plan with patient and/or surrogate as well as nursing, discussions with consultants, evaluation of patient's response to treatment, examination of patient, obtaining  history from patient or surrogate, ordering and performing treatments and interventions, ordering and review of laboratory studies, ordering and review of radiographic studies, pulse oximetry, re-evaluation of patient's condition and participation in multidisciplinary rounds.   During this encounter critical care time was devoted to patient care services described in this note for 32 minutes.     Cheri Fowler, MD [facility] See Amion for pager If no response to pager, please call [phone] until 7pm After 7pm, Please call E-link [phone]

## 2023-01-07 NOTE — Progress Notes (Signed)
Patient extubated to comfort per written order with RN at bedside.

## 2023-01-07 DEATH — deceased
